# Patient Record
Sex: Male | Born: 1940
Health system: Southern US, Community
[De-identification: ages and names within clinical notes are randomized; demographics above are authoritative.]

## PROBLEM LIST (undated history)

## (undated) DIAGNOSIS — Z95810 Presence of automatic (implantable) cardiac defibrillator: Secondary | ICD-10-CM

## (undated) DIAGNOSIS — I10 Essential (primary) hypertension: Secondary | ICD-10-CM

## (undated) DIAGNOSIS — I499 Cardiac arrhythmia, unspecified: Secondary | ICD-10-CM

## (undated) DIAGNOSIS — I2510 Atherosclerotic heart disease of native coronary artery without angina pectoris: Secondary | ICD-10-CM

## (undated) DIAGNOSIS — C61 Malignant neoplasm of prostate: Secondary | ICD-10-CM

## (undated) DIAGNOSIS — E039 Hypothyroidism, unspecified: Secondary | ICD-10-CM

## (undated) DIAGNOSIS — Z950 Presence of cardiac pacemaker: Secondary | ICD-10-CM

## (undated) DIAGNOSIS — K219 Gastro-esophageal reflux disease without esophagitis: Secondary | ICD-10-CM

## (undated) DIAGNOSIS — E785 Hyperlipidemia, unspecified: Secondary | ICD-10-CM

## (undated) DIAGNOSIS — I517 Cardiomegaly: Secondary | ICD-10-CM

## (undated) DIAGNOSIS — N200 Calculus of kidney: Secondary | ICD-10-CM

## (undated) DIAGNOSIS — I219 Acute myocardial infarction, unspecified: Secondary | ICD-10-CM

## (undated) DIAGNOSIS — H9190 Unspecified hearing loss, unspecified ear: Secondary | ICD-10-CM

## (undated) DIAGNOSIS — I509 Heart failure, unspecified: Secondary | ICD-10-CM

## (undated) HISTORY — PX: PACEMAKER INSERTION: SHX728

## (undated) HISTORY — PX: PROSTATE SURGERY: SHX751

## (undated) HISTORY — PX: HERNIA REPAIR: SHX51

## (undated) HISTORY — PX: PROSTATECTOMY: SHX69

## (undated) HISTORY — PX: CARDIAC DEFIBRILLATOR PLACEMENT: SHX171
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
## 1998-12-15 ENCOUNTER — Emergency Department (HOSPITAL_COMMUNITY): Admission: EM | Admit: 1998-12-15 | Discharge: 1998-12-15 | Payer: Self-pay

## 2004-11-18 ENCOUNTER — Inpatient Hospital Stay (HOSPITAL_COMMUNITY): Admission: RE | Admit: 2004-11-18 | Discharge: 2004-11-20 | Payer: Self-pay | Admitting: Urology

## 2004-11-21 ENCOUNTER — Emergency Department (HOSPITAL_COMMUNITY): Admission: EM | Admit: 2004-11-21 | Discharge: 2004-11-21 | Payer: Self-pay | Admitting: Emergency Medicine

## 2005-04-21 ENCOUNTER — Inpatient Hospital Stay (HOSPITAL_COMMUNITY): Admission: RE | Admit: 2005-04-21 | Discharge: 2005-04-22 | Payer: Self-pay | Admitting: Urology

## 2012-12-02 ENCOUNTER — Observation Stay (HOSPITAL_COMMUNITY)
Admission: EM | Admit: 2012-12-02 | Discharge: 2012-12-04 | Disposition: A | Discharge status: Home / Self Care | Payer: Medicare Other | Attending: Internal Medicine | Admitting: Internal Medicine

## 2012-12-02 ENCOUNTER — Emergency Department (HOSPITAL_COMMUNITY): Payer: Medicare Other

## 2012-12-02 ENCOUNTER — Encounter (HOSPITAL_COMMUNITY): Payer: Self-pay | Admitting: Emergency Medicine

## 2012-12-02 DIAGNOSIS — I2510 Atherosclerotic heart disease of native coronary artery without angina pectoris: Secondary | ICD-10-CM

## 2012-12-02 DIAGNOSIS — Z95810 Presence of automatic (implantable) cardiac defibrillator: Secondary | ICD-10-CM

## 2012-12-02 DIAGNOSIS — R109 Unspecified abdominal pain: Secondary | ICD-10-CM | POA: Diagnosis present

## 2012-12-02 DIAGNOSIS — C61 Malignant neoplasm of prostate: Secondary | ICD-10-CM | POA: Diagnosis present

## 2012-12-02 DIAGNOSIS — E039 Hypothyroidism, unspecified: Secondary | ICD-10-CM

## 2012-12-02 DIAGNOSIS — R739 Hyperglycemia, unspecified: Secondary | ICD-10-CM

## 2012-12-02 DIAGNOSIS — N12 Tubulo-interstitial nephritis, not specified as acute or chronic: Principal | ICD-10-CM

## 2012-12-02 DIAGNOSIS — E876 Hypokalemia: Secondary | ICD-10-CM

## 2012-12-02 DIAGNOSIS — I4891 Unspecified atrial fibrillation: Secondary | ICD-10-CM

## 2012-12-02 DIAGNOSIS — R10A1 Flank pain, right side: Secondary | ICD-10-CM

## 2012-12-02 DIAGNOSIS — R7309 Other abnormal glucose: Secondary | ICD-10-CM | POA: Insufficient documentation

## 2012-12-02 DIAGNOSIS — Z7901 Long term (current) use of anticoagulants: Secondary | ICD-10-CM | POA: Insufficient documentation

## 2012-12-02 DIAGNOSIS — Z79899 Other long term (current) drug therapy: Secondary | ICD-10-CM | POA: Insufficient documentation

## 2012-12-02 HISTORY — DX: Atherosclerotic heart disease of native coronary artery without angina pectoris: I25.10

## 2012-12-02 HISTORY — DX: Calculus of kidney: N20.0

## 2012-12-02 HISTORY — DX: Essential (primary) hypertension: I10

## 2012-12-02 HISTORY — DX: Malignant neoplasm of prostate: C61

## 2012-12-02 HISTORY — DX: Hyperlipidemia, unspecified: E78.5

## 2012-12-02 HISTORY — DX: Acute myocardial infarction, unspecified: I21.9

## 2012-12-02 LAB — COMPREHENSIVE METABOLIC PANEL
ALT: 21 U/L (ref 0–53)
Albumin: 3.2 g/dL — ABNORMAL LOW (ref 3.5–5.2)
Alkaline Phosphatase: 83 U/L (ref 39–117)
BUN: 12 mg/dL (ref 6–23)
CO2: 31 mEq/L (ref 19–32)
Calcium: 9.4 mg/dL (ref 8.4–10.5)
Chloride: 107 mEq/L (ref 96–112)
Creatinine, Ser: 1.03 mg/dL (ref 0.50–1.35)
GFR calc Af Amer: 82 mL/min — ABNORMAL LOW (ref >90)
Potassium: 3.4 mEq/L — ABNORMAL LOW (ref 3.5–5.1)
Total Bilirubin: 1.2 mg/dL (ref 0.3–1.2)
Total Protein: 6.5 g/dL (ref 6.0–8.3)

## 2012-12-02 LAB — CBC WITH DIFFERENTIAL/PLATELET
Basophils Absolute: 0 10*3/uL (ref 0.0–0.1)
Basophils Relative: 1 % (ref 0–1)
Eosinophils Absolute: 0.1 10*3/uL (ref 0.0–0.7)
HCT: 35.7 % — ABNORMAL LOW (ref 39.0–52.0)
Hemoglobin: 11.6 g/dL — ABNORMAL LOW (ref 13.0–17.0)
Lymphocytes Relative: 27 % (ref 12–46)
MCH: 28.8 pg (ref 26.0–34.0)
MCHC: 32.5 g/dL (ref 30.0–36.0)
MCV: 88.6 fL (ref 78.0–100.0)
Monocytes Absolute: 0.8 10*3/uL (ref 0.1–1.0)
Monocytes Relative: 11 % (ref 3–12)
Neutro Abs: 4.5 10*3/uL (ref 1.7–7.7)
Neutrophils Relative %: 60 % (ref 43–77)
RBC: 4.03 MIL/uL — ABNORMAL LOW (ref 4.22–5.81)
RDW: 17.4 % — ABNORMAL HIGH (ref 11.5–15.5)

## 2012-12-02 LAB — URINALYSIS, ROUTINE W REFLEX MICROSCOPIC
Bilirubin Urine: NEGATIVE
Hgb urine dipstick: NEGATIVE
Ketones, ur: NEGATIVE mg/dL
Leukocytes, UA: NEGATIVE
Nitrite: NEGATIVE
Specific Gravity, Urine: 1.024 (ref 1.005–1.030)
Urobilinogen, UA: 0.2 mg/dL (ref 0.0–1.0)
pH: 5 (ref 5.0–8.0)

## 2012-12-02 LAB — LIPASE, BLOOD: Lipase: 21 U/L (ref 11–59)

## 2012-12-02 MED ORDER — MORPHINE SULFATE 4 MG/ML IJ SOLN
4.0000 mg | Freq: Once | INTRAMUSCULAR | Status: AC
  Administered 2012-12-02: 4 mg via INTRAVENOUS
  Filled 2012-12-02: qty 1

## 2012-12-02 MED ORDER — ONDANSETRON HCL 4 MG/2ML IJ SOLN: 4.0000 mg | Freq: Four times a day (QID) | INTRAMUSCULAR | Status: DC | PRN

## 2012-12-02 MED ORDER — OXYCODONE-ACETAMINOPHEN 5-325 MG PO TABS
1.0000 | ORAL_TABLET | Freq: Four times a day (QID) | ORAL | Status: DC | PRN
  Administered 2012-12-02 – 2012-12-03 (×3): 2 via ORAL
  Filled 2012-12-02 (×3): qty 2

## 2012-12-02 MED ORDER — SODIUM CHLORIDE 0.9 % IV SOLN
INTRAVENOUS | Status: DC
  Administered 2012-12-02 – 2012-12-04 (×4): via INTRAVENOUS

## 2012-12-02 MED ORDER — DEXTROSE 5 % IV SOLN
1.0000 g | INTRAVENOUS | Status: DC
  Administered 2012-12-02 – 2012-12-03 (×2): 1 g via INTRAVENOUS
  Filled 2012-12-02 (×3): qty 10

## 2012-12-02 MED ORDER — SODIUM CHLORIDE 0.9 % IV BOLUS (SEPSIS)
1000.0000 mL | Freq: Once | INTRAVENOUS | Status: AC
  Administered 2012-12-02: 1000 mL via INTRAVENOUS

## 2012-12-02 MED ORDER — ONDANSETRON HCL 4 MG/2ML IJ SOLN
4.0000 mg | Freq: Once | INTRAMUSCULAR | Status: AC
  Administered 2012-12-02: 4 mg via INTRAVENOUS
  Filled 2012-12-02: qty 2

## 2012-12-02 MED ORDER — LEVOTHYROXINE SODIUM 88 MCG PO TABS
88.0000 ug | ORAL_TABLET | Freq: Every day | ORAL | Status: DC
  Administered 2012-12-03 – 2012-12-04 (×2): 88 ug via ORAL
  Filled 2012-12-02 (×3): qty 1

## 2012-12-02 MED ORDER — ATORVASTATIN CALCIUM 10 MG PO TABS
10.0000 mg | ORAL_TABLET | Freq: Every day | ORAL | Status: DC
  Administered 2012-12-03 – 2012-12-04 (×2): 10 mg via ORAL
  Filled 2012-12-02 (×2): qty 1

## 2012-12-02 MED ORDER — MORPHINE SULFATE 4 MG/ML IJ SOLN
4.0000 mg | INTRAMUSCULAR | Status: DC | PRN
  Administered 2012-12-02: 4 mg via INTRAVENOUS
  Filled 2012-12-02: qty 1

## 2012-12-02 MED ORDER — ACETAMINOPHEN 325 MG PO TABS: 650.0000 mg | ORAL_TABLET | Freq: Four times a day (QID) | ORAL | Status: DC | PRN

## 2012-12-02 MED ORDER — ASPIRIN EC 81 MG PO TBEC
81.0000 mg | DELAYED_RELEASE_TABLET | Freq: Every day | ORAL | Status: DC
  Administered 2012-12-02 – 2012-12-04 (×3): 81 mg via ORAL
  Filled 2012-12-02 (×3): qty 1

## 2012-12-02 MED ORDER — WARFARIN SODIUM 2.5 MG PO TABS
2.5000 mg | ORAL_TABLET | Freq: Once | ORAL | Status: AC
  Administered 2012-12-02: 2.5 mg via ORAL
  Filled 2012-12-02: qty 1

## 2012-12-02 MED ORDER — PANTOPRAZOLE SODIUM 40 MG PO TBEC
40.0000 mg | DELAYED_RELEASE_TABLET | Freq: Every day | ORAL | Status: DC
  Administered 2012-12-03 – 2012-12-04 (×2): 40 mg via ORAL
  Filled 2012-12-02 (×2): qty 1

## 2012-12-02 MED ORDER — CARVEDILOL 25 MG PO TABS
25.0000 mg | ORAL_TABLET | Freq: Two times a day (BID) | ORAL | Status: DC
  Administered 2012-12-02 – 2012-12-04 (×4): 25 mg via ORAL
  Filled 2012-12-02 (×6): qty 1

## 2012-12-02 MED ORDER — ZOLPIDEM TARTRATE 5 MG PO TABS
5.0000 mg | ORAL_TABLET | Freq: Every day | ORAL | Status: DC
  Administered 2012-12-02 – 2012-12-03 (×2): 5 mg via ORAL
  Filled 2012-12-02 (×2): qty 1

## 2012-12-02 MED ORDER — WARFARIN - PHARMACIST DOSING INPATIENT: Freq: Every day | Status: DC

## 2012-12-02 NOTE — ED Notes (Addendum)
Patient with lower right sided abdominal pain since Friday.  Rates pain as a 9 and pain is constant.  Patient reports normal BM this AM.  Denies nausea, vomiting.  Pain describes pain as aching.  Patient reports pain is similar to kidney stone pain.  Reports some burning with urination.

## 2012-12-02 NOTE — Progress Notes (Signed)
ANTICOAGULATION CONSULT NOTE - Initial Consult  Pharmacy Consult for Coumadin Indication: atrial fibrillation  Allergies  Allergen Reactions  . Ivp Dye (Iodinated Diagnostic Agents) Anaphylaxis    Patient Measurements: Height: 5\' 10"  (177.8 cm) Weight: 154 lb (69.854 kg) IBW/kg (Calculated) : 73  Vital Signs: Temp: 98 F (36.7 C) (08/04 1606) Temp src: Oral (08/04 1606) BP: 145/99 mmHg (08/04 1606) Pulse Rate: 80 (08/04 1606)  Labs:  Recent Labs  12/02/12 1150 12/02/12 1713  HGB 11.6*  --   HCT 35.7*  --   PLT 255  --   LABPROT  --  23.0*  INR  --  2.11*  CREATININE 1.03  --     Estimated Creatinine Clearance: 64.1 ml/min (by C-G formula based on Cr of 1.03).   Medical History: Past Medical History  Diagnosis Date  . Kidney stones   . Prostate cancer   . MI (myocardial infarction)   . Coronary artery disease   . Hyperlipidemia   . Hypertension     Medications:  Prescriptions prior to admission  Medication Sig Dispense Refill  . aspirin EC 81 MG tablet Take 81 mg by mouth daily.      Marland Kitchen atorvastatin (LIPITOR) 10 MG tablet Take 10 mg by mouth daily.      . carvedilol (COREG) 25 MG tablet Take 25 mg by mouth 2 (two) times daily with a meal.      . furosemide (LASIX) 40 MG tablet Take 40 mg by mouth daily.      Marland Kitchen levothyroxine (SYNTHROID, LEVOTHROID) 88 MCG tablet Take 88 mcg by mouth daily before breakfast.      . omeprazole (PRILOSEC) 40 MG capsule Take 40 mg by mouth daily.      Marland Kitchen oxyCODONE-acetaminophen (PERCOCET/ROXICET) 5-325 MG per tablet Take 1-2 tablets by mouth every 6 (six) hours as needed for pain.      . potassium chloride SA (K-DUR,KLOR-CON) 20 MEQ tablet Take 10 mEq by mouth daily.      . Simethicone (GAS-X PO) Take 1 capsule by mouth 2 (two) times daily as needed (gas).      . warfarin (COUMADIN) 2.5 MG tablet Take 1.25-2.5 mg by mouth daily. 2.5 mg for 2 days then 1.25 mg for 1 day. Pt took 2.5mg  last night      . zolpidem (AMBIEN) 5 MG  tablet Take 5 mg by mouth at bedtime.       Scheduled:  . aspirin EC  81 mg Oral Daily  . [START ON 12/03/2012] atorvastatin  10 mg Oral Daily  . carvedilol  25 mg Oral BID WC  . cefTRIAXone (ROCEPHIN)  IV  1 g Intravenous Q24H  . [START ON 12/03/2012] levothyroxine  88 mcg Oral QAC breakfast  . [START ON 12/03/2012] pantoprazole  40 mg Oral Daily  . zolpidem  5 mg Oral QHS    Assessment:  72yo M admitted with R flank/abd pain. Pharmacy is asked to dose Coumadin for A.fib. Home dose is 2.5mg  x 2 days then 1.25mg  x 1 day. Last taken 2.5mg  on 12/01/12. INR is in therapeutic range.  Goal of Therapy:  INR 2-3 Monitor platelets by anticoagulation protocol: Yes   Plan:   Give Coumadin 2.5 mg tonight.  Check PT/INR daily.  Charolotte Eke, PharmD, pager [phone]. 12/02/2012,6:23 PM.

## 2012-12-02 NOTE — ED Provider Notes (Signed)
CSN: [id number]     Arrival date & time 12/02/12  1108 History     First MD Initiated Contact with Patient 12/02/12 1117     Chief Complaint  Patient presents with  . Abdominal Pain   (Consider location/radiation/quality/duration/timing/severity/associated sxs/prior Treatment) HPI Comments: Patient presents emergency department with chief complaint of right-sided abdominal pain, that began on Friday. States that it is progressively worsened. He states that it feels similar to previous kidney stone. He denies fevers, chills, nausea, and vomiting. States his pain is 9/10, and is constant. He states that he had a kidney stone approximately 15 years ago, that had to be removed.   The history is provided by the patient. No language interpreter was used.    Past Medical History  Diagnosis Date  . Kidney stones   . Prostate cancer   . MI (myocardial infarction)   . Coronary artery disease   . Hyperlipidemia   . Hypertension    Past Surgical History  Procedure Laterality Date  . Prostatectomy    . Pacemaker insertion    . Cardiac defibrillator placement    . Hernia repair     No family history on file. History  Substance Use Topics  . Smoking status: Never Smoker   . Smokeless tobacco: Not on file  . Alcohol Use: No    Review of Systems  All other systems reviewed and are negative.    Allergies  Review of patient's allergies indicates not on file.  Home Medications  No current outpatient prescriptions on file. BP 156/89  Pulse 74  Temp(Src) 97.7 F (36.5 C) (Oral)  Resp 16  SpO2 97% Physical Exam  Nursing note and vitals reviewed. Constitutional: He is oriented to person, place, and time. He appears well-developed and well-nourished.  HENT:  Head: Normocephalic and atraumatic.  Eyes: Conjunctivae and EOM are normal. Pupils are equal, round, and reactive to light. Right eye exhibits no discharge. Left eye exhibits no discharge. No scleral icterus.  Neck: Normal  range of motion. Neck supple. No JVD present.  Cardiovascular: Normal rate, regular rhythm, normal heart sounds and intact distal pulses.  Exam reveals no gallop and no friction rub.   No murmur heard. Pulmonary/Chest: Effort normal and breath sounds normal. No respiratory distress. He has no wheezes. He has no rales. He exhibits no tenderness.  Abdominal: Soft. Bowel sounds are normal. He exhibits no distension and no mass. There is tenderness. There is guarding. There is no rebound.  Right lower quadrant tenderness palpation, with guarding  Musculoskeletal: Normal range of motion. He exhibits no edema and no tenderness.  Neurological: He is alert and oriented to person, place, and time.  CN 3-12 intact  Skin: Skin is warm and dry.  Psychiatric: He has a normal mood and affect. His behavior is normal. Judgment and thought content normal.    ED Course   Procedures (including critical care time)  Labs Reviewed  CBC WITH DIFFERENTIAL  COMPREHENSIVE METABOLIC PANEL  LIPASE, BLOOD  URINALYSIS, ROUTINE W REFLEX MICROSCOPIC   Results for orders placed during the hospital encounter of 12/02/12  CBC WITH DIFFERENTIAL      Result Value Range   WBC 7.6  4.0 - 10.5 K/uL   RBC 4.03 (*) 4.22 - 5.81 MIL/uL   Hemoglobin 11.6 (*) 13.0 - 17.0 g/dL   HCT 78.2 (*) 95.6 - 21.3 %   MCV 88.6  78.0 - 100.0 fL   MCH 28.8  26.0 - 34.0 pg   MCHC  32.5  30.0 - 36.0 g/dL   RDW 40.9 (*) 81.1 - 91.4 %   Platelets 255  150 - 400 K/uL   Neutrophils Relative % 60  43 - 77 %   Neutro Abs 4.5  1.7 - 7.7 K/uL   Lymphocytes Relative 27  12 - 46 %   Lymphs Abs 2.1  0.7 - 4.0 K/uL   Monocytes Relative 11  3 - 12 %   Monocytes Absolute 0.8  0.1 - 1.0 K/uL   Eosinophils Relative 2  0 - 5 %   Eosinophils Absolute 0.1  0.0 - 0.7 K/uL   Basophils Relative 1  0 - 1 %   Basophils Absolute 0.0  0.0 - 0.1 K/uL  COMPREHENSIVE METABOLIC PANEL      Result Value Range   Sodium 144  135 - 145 mEq/L   Potassium 3.4 (*) 3.5  - 5.1 mEq/L   Chloride 107  96 - 112 mEq/L   CO2 31  19 - 32 mEq/L   Glucose, Bld 182 (*) 70 - 99 mg/dL   BUN 12  6 - 23 mg/dL   Creatinine, Ser 7.82  0.50 - 1.35 mg/dL   Calcium 9.4  8.4 - 95.6 mg/dL   Total Protein 6.5  6.0 - 8.3 g/dL   Albumin 3.2 (*) 3.5 - 5.2 g/dL   AST 18  0 - 37 U/L   ALT 21  0 - 53 U/L   Alkaline Phosphatase 83  39 - 117 U/L   Total Bilirubin 1.2  0.3 - 1.2 mg/dL   GFR calc non Af Amer 71 (*) >90 mL/min   GFR calc Af Amer 82 (*) >90 mL/min  LIPASE, BLOOD      Result Value Range   Lipase 21  11 - 59 U/L  URINALYSIS, ROUTINE W REFLEX MICROSCOPIC      Result Value Range   Color, Urine YELLOW  YELLOW   APPearance CLEAR  CLEAR   Specific Gravity, Urine 1.024  1.005 - 1.030   pH 5.0  5.0 - 8.0   Glucose, UA NEGATIVE  NEGATIVE mg/dL   Hgb urine dipstick NEGATIVE  NEGATIVE   Bilirubin Urine NEGATIVE  NEGATIVE   Ketones, ur NEGATIVE  NEGATIVE mg/dL   Protein, ur NEGATIVE  NEGATIVE mg/dL   Urobilinogen, UA 0.2  0.0 - 1.0 mg/dL   Nitrite NEGATIVE  NEGATIVE   Leukocytes, UA NEGATIVE  NEGATIVE   Ct Abdomen Pelvis Wo Contrast  12/02/2012   *RADIOLOGY REPORT*  Clinical Data: Flank pain  CT ABDOMEN AND PELVIS WITHOUT CONTRAST  Technique:  Multidetector CT imaging of the abdomen and pelvis was performed following the standard protocol without oral or intravenous contrast.  Comparison: November 28, 2012  Findings:  There is scarring in the lung bases with posterior basilar pleural thickening bilaterally.  There are pacemaker leads attached to the heart.  Heart is enlarged. There are multiple foci of coronary artery calcification.  No focal liver lesions are identified on this nonintravenous contrast enhanced study. There is no biliary duct dilatation. There is sludge in the gallbladder.  Spleen, pancreas, adrenals appear normal.  There is a 3 mm calculus in the posterior mid left kidney, stable. There are no other intrarenal calculi on either side.  There is no hydronephrosis or  renal mass on either side.  Mild scarring in Gerota's fascia bilaterally is stable.  In the pelvis, there is no mass or fluid collection.  A calcification in the inferior aspect of  the bladder measuring 8 x 8 mm remains without change.  There are smaller calcifications at the level of the prostatic urethra.  There are multiple sigmoid diverticula without diverticulitis.  Appendix appears normal.  There is no bowel obstruction.  No free air or portal venous air.  There is no ascites, adenopathy, or abscess in the abdomen or pelvis.  There is atherosclerotic change in the aorta iliac arteries but no aneurysm.  There are no appreciable blastic or lytic bone lesions.  IMPRESSION: Calcifications in the inferior bladder and prostatic urethra. These calcifications may represent dystrophic calcifications; calculus is possible, however.  Potentially, direct visualization may be advisable to further evaluate these areas.  There are multiple sigmoid diverticula without diverticulitis.  There is a nonobstructing 3 mm calculus in the left kidney.  There is no hydronephrosis on either side.  No ureteral calculi identified.  There is no abscess or bowel obstruction.  There are multiple foci of coronary artery calcification.  Appendix appears normal.  There is stable scarring in Gerota's fascia bilaterally, a finding of questionable clinical significance.   Original Report Authenticated By: Bretta Bang, M.D.     1. Right flank pain     MDM  Patient with right flank pain.  Past history of kidney stones. Order CT scan, and reevaluate.  Patient seen by and discussed with Dr. Jodi Mourning, who tells me that we should probably admit the patient to medicine for observation. CT is negative, the patient's pain is still moderate to severe. Negative CT findings do not coincide with patient's physical exam and right flank pain. Patient has a contrast allergy.  Discussed patient with Dr. Jomarie Longs, who will admit the patient to  medicine.  Roxy Horseman, PA-C 12/02/12 1538

## 2012-12-02 NOTE — H&P (Signed)
[name] History and Physical  [name]:[id number] DOB: 04-13-1941 DOA: 12/02/2012  Referring physician:EDP PCP: [name], MD  Specialists: Dr.[facility]  Chief Complaint: R flank/abd pain  HPI: Mitchell Murray is a 72 y.o. male with PMH of prostate cancer, renal calculi, CAD, AICD, Afib on Coumadin presents to the ER with the above complaint. Patient reports onset of right flank/abdominal pain on Friday, it was 7/10 then, nonradiating, it has progressively worsened. He denies any fevers or chills, but reports dysuria and urinary frequency for past few days. He went to the ER in [place], had a CT scan done which was felt to be okay and was discharged home. Since he continued to have severe pain he came to the ER today. No history of nausea or vomiting, last bowel movement was earlier today   Review of Systems: The patient denies anorexia, fever, weight loss,, vision loss, decreased hearing, hoarseness, chest pain, syncope, dyspnea on exertion, peripheral edema, balance deficits, hemoptysis, abdominal pain, melena, hematochezia, severe indigestion/heartburn, hematuria, incontinence, genital sores, muscle weakness, suspicious skin lesions, transient blindness, difficulty walking, depression, unusual weight change, abnormal bleeding, enlarged lymph nodes, angioedema, and breast masses.    Past Medical History  Diagnosis Date  . Kidney stones   . Prostate cancer   . MI (myocardial infarction)   . Coronary artery disease   . Hyperlipidemia   . Hypertension    Past Surgical History  Procedure Laterality Date  . Prostatectomy    . Pacemaker insertion    . Cardiac defibrillator placement    . Hernia repair     Social History:  reports that he has never smoked. He has never used smokeless tobacco. He reports that he does not drink alcohol or use illicit drugs. Lives at home with his wife independent in ADLs  Allergies  Allergen Reactions  . Ivp Dye  (Iodinated Diagnostic Agents) Anaphylaxis    family history. Significant for heart disease in father  Prior to Admission medications   Medication Sig Start Date End Date Taking? Authorizing Provider  aspirin EC 81 MG tablet Take 81 mg by mouth daily.   Yes Historical Provider, MD  atorvastatin (LIPITOR) 10 MG tablet Take 10 mg by mouth daily.   Yes Historical Provider, MD  carvedilol (COREG) 25 MG tablet Take 25 mg by mouth 2 (two) times daily with a meal.   Yes Historical Provider, MD  furosemide (LASIX) 40 MG tablet Take 40 mg by mouth daily.   Yes Historical Provider, MD  levothyroxine (SYNTHROID, LEVOTHROID) 88 MCG tablet Take 88 mcg by mouth daily before breakfast.   Yes Historical Provider, MD  omeprazole (PRILOSEC) 40 MG capsule Take 40 mg by mouth daily.   Yes Historical Provider, MD  oxyCODONE-acetaminophen (PERCOCET/ROXICET) 5-325 MG per tablet Take 1-2 tablets by mouth every 6 (six) hours as needed for pain.   Yes Historical Provider, MD  potassium chloride SA (K-DUR,KLOR-CON) 20 MEQ tablet Take 10 mEq by mouth daily.   Yes Historical Provider, MD  Simethicone (GAS-X PO) Take 1 capsule by mouth 2 (two) times daily as needed (gas).   Yes Historical Provider, MD  warfarin (COUMADIN) 2.5 MG tablet Take 1.25-2.5 mg by mouth daily. 2.5 mg for 2 days then 1.25 mg for 1 day. Pt took 2.5mg  last night   Yes Historical Provider, MD  zolpidem (AMBIEN) 5 MG tablet Take 5 mg by mouth at bedtime.   Yes Historical Provider, MD   Physical Exam: Filed Vitals:   12/02/12 1414  BP: 131/88  Pulse: 75  Temp:   Resp: 20     General: Alert awake oriented x3 in mild  acute distress due to pain  HEENT PERRLA, EOMI  CVS S1-S2 regular rate rhythm no murmurs rubs or gallops   Lungs are clear to auscultation bilaterally  Abdomen soft, severe right flank tenderness, bowel sounds present  Extremities no edema clubbing or cyanosis  Skin no rashes or skin breakdown  Neuro moves all extremities  no localizing signs  Psychiatric appropriate mood and affect   Labs on Admission:  Basic Metabolic Panel:  Recent Labs Lab 12/02/12 1150  NA 144  K 3.4*  CL 107  CO2 31  GLUCOSE 182*  BUN 12  CREATININE 1.03  CALCIUM 9.4   Liver Function Tests:  Recent Labs Lab 12/02/12 1150  AST 18  ALT 21  ALKPHOS 83  BILITOT 1.2  PROT 6.5  ALBUMIN 3.2*    Recent Labs Lab 12/02/12 1150  LIPASE 21   No results found for this basename: AMMONIA,  in the last 168 hours CBC:  Recent Labs Lab 12/02/12 1150  WBC 7.6  NEUTROABS 4.5  HGB 11.6*  HCT 35.7*  MCV 88.6  PLT 255   Cardiac Enzymes: No results found for this basename: CKTOTAL, CKMB, CKMBINDEX, TROPONINI,  in the last 168 hours  BNP (last 3 results) No results found for this basename: PROBNP,  in the last 8760 hours CBG: No results found for this basename: GLUCAP,  in the last 168 hours  Radiological Exams on Admission: Ct Abdomen Pelvis Wo Contrast  12/02/2012   *RADIOLOGY REPORT*  Clinical Data: Flank pain  CT ABDOMEN AND PELVIS WITHOUT CONTRAST  Technique:  Multidetector CT imaging of the abdomen and pelvis was performed following the standard protocol without oral or intravenous contrast.  Comparison: November 28, 2012  Findings:  There is scarring in the lung bases with posterior basilar pleural thickening bilaterally.  There are pacemaker leads attached to the heart.  Heart is enlarged. There are multiple foci of coronary artery calcification.  No focal liver lesions are identified on this nonintravenous contrast enhanced study. There is no biliary duct dilatation. There is sludge in the gallbladder.  Spleen, pancreas, adrenals appear normal.  There is a 3 mm calculus in the posterior mid left kidney, stable. There are no other intrarenal calculi on either side.  There is no hydronephrosis or renal mass on either side.  Mild scarring in Gerota's fascia bilaterally is stable.  In the pelvis, there is no mass or fluid  collection.  A calcification in the inferior aspect of the bladder measuring 8 x 8 mm remains without change.  There are smaller calcifications at the level of the prostatic urethra.  There are multiple sigmoid diverticula without diverticulitis.  Appendix appears normal.  There is no bowel obstruction.  No free air or portal venous air.  There is no ascites, adenopathy, or abscess in the abdomen or pelvis.  There is atherosclerotic change in the aorta iliac arteries but no aneurysm.  There are no appreciable blastic or lytic bone lesions.  IMPRESSION: Calcifications in the inferior bladder and prostatic urethra. These calcifications may represent dystrophic calcifications; calculus is possible, however.  Potentially, direct visualization may be advisable to further evaluate these areas.  There are multiple sigmoid diverticula without diverticulitis.  There is a nonobstructing 3 mm calculus in the left kidney.  There is no hydronephrosis on either side.  No ureteral calculi identified.  There is no  abscess or bowel obstruction.  There are multiple foci of coronary artery calcification.  Appendix appears normal.  There is stable scarring in Gerota's fascia bilaterally, a finding of questionable clinical significance.   Original Report Authenticated By: Bretta Bang, M.D.    Assessment/Plan 1. Right pyelonephritis  - Reviewed scans with the radiologist, he has swelling of Gerotas fascia around the right kidney, this was also noted on CT scan today and 7/31 at [facility], which can be seen in the setting of pyelonephritis. Unfortunately CT scan was noncontrast due to history of allergy - Given clinical symptoms and severe right CVA tenderness today, will treat was pyelonephritis  -Will start IV ceftriaxone, UA not very impressive but will check cultures -IV fluids, supportive care  2. history of CAD, AICD -Continue aspirin or blocker and statin  3. history of Afib -In NSR now, continue metoprolol and  Coumadin  4. history of prostate cancer -Status post prostatectomy in 2008, followed by Dr. Annabell Howells  5. Hypothyroidism -Continue Synthroid  DVT prophylaxis: Already anticoagulated with Coumadin  Code Status: DNR Family Communication: d/w pt and wife at bedside Disposition Plan: inpatient  Time spent:  [name] Pager [phone]  If 7PM-7AM, please contact night-coverage [email or web address] Password [facility] 12/02/2012, 3:41 PM

## 2012-12-02 NOTE — Progress Notes (Signed)
Pt confirms pcp is [facility] updated

## 2012-12-03 DIAGNOSIS — E039 Hypothyroidism, unspecified: Secondary | ICD-10-CM | POA: Diagnosis present

## 2012-12-03 DIAGNOSIS — E876 Hypokalemia: Secondary | ICD-10-CM | POA: Diagnosis present

## 2012-12-03 DIAGNOSIS — R739 Hyperglycemia, unspecified: Secondary | ICD-10-CM | POA: Diagnosis present

## 2012-12-03 LAB — CBC
HCT: 32.9 % — ABNORMAL LOW (ref 39.0–52.0)
Hemoglobin: 10.5 g/dL — ABNORMAL LOW (ref 13.0–17.0)
MCH: 28.9 pg (ref 26.0–34.0)
MCV: 90.6 fL (ref 78.0–100.0)
Platelets: 267 10*3/uL (ref 150–400)
RBC: 3.63 MIL/uL — ABNORMAL LOW (ref 4.22–5.81)
WBC: 6.9 10*3/uL (ref 4.0–10.5)

## 2012-12-03 LAB — BASIC METABOLIC PANEL
BUN: 10 mg/dL (ref 6–23)
CO2: 30 mEq/L (ref 19–32)
Calcium: 8.6 mg/dL (ref 8.4–10.5)
Chloride: 107 mEq/L (ref 96–112)
Creatinine, Ser: 1.01 mg/dL (ref 0.50–1.35)
Glucose, Bld: 137 mg/dL — ABNORMAL HIGH (ref 70–99)

## 2012-12-03 LAB — HEMOGLOBIN A1C: Mean Plasma Glucose: 128 mg/dL — ABNORMAL HIGH (ref <117)

## 2012-12-03 LAB — PROTIME-INR: Prothrombin Time: 25.2 seconds — ABNORMAL HIGH (ref 11.6–15.2)

## 2012-12-03 MED ORDER — WARFARIN 1.25 MG HALF TABLET
1.2500 mg | ORAL_TABLET | Freq: Once | ORAL | Status: AC
  Administered 2012-12-03: 1.25 mg via ORAL
  Filled 2012-12-03: qty 1

## 2012-12-03 MED ORDER — POTASSIUM CHLORIDE CRYS ER 20 MEQ PO TBCR
20.0000 meq | EXTENDED_RELEASE_TABLET | Freq: Three times a day (TID) | ORAL | Status: AC
  Administered 2012-12-03 (×3): 20 meq via ORAL
  Filled 2012-12-03 (×3): qty 1

## 2012-12-03 NOTE — Progress Notes (Signed)
[name] PROGRESS NOTE  DOMINICO ROD UJW:[id number] DOB: 1940/05/16 DOA: 12/02/2012 PCP: [name], MD  Brief narrative: Mitchell Murray is an 72 y.o. male with a PMH of prostate cancer, renal calculi, CAD status post AICD, and atrial fibrillation on chronic Coumadin who was admitted with suspected right-sided pyelonephritis on 12/02/2012. Patient had rapid resolution of his flank pain overnight and at this point, the differential diagnosis is pyelonephritis versus nephrolithiasis.  Assessment/Plan: Principal Problem:   Pyelonephritis -CT scans reviewed by admitting physician with radiologist: swelling of Gerotas fascia around the right kidney noted. This can be seen with pyelonephritis but is nonspecific. -Questionable nephrolithiasis. Continue hydration. May have passed a stone given rapid resolution of flank pain. -Placed on empiric Rocephin. Urine bland. Awaiting culture. If negative, would discontinue antibiotics and discharged home. If positive, would treat for 10-14 days with appropriate therapy. -Mobilize with PT/nursing staff to ensure pain has not returned. Active Problems:   Hyperglycemia -Glucose 182 on admission. Not a fasting sample. Glucose 137 nonfasting sample.  We'll check hemoglobin A1c.   Hypokalemia -Replace potassium.   Right flank pain -Thought to be secondary to pyelonephritis versus nephrolithiasis. Rapid resolution of pain suggests that he may have passed a kidney stone.   CAD (coronary artery disease) /  AICD (automatic cardioverter/defibrillator) present -Continue aspirin, beta blocker, and statin.   A-fib -Continue Coumadin per pharmacy. -Heart sounds are regular on exam and heart rate 60s-80s.   Prostate cancer -Status post prostatectomy in 2008.   Hypothyroidism -Continue Synthroid.  Code Status: DNR Family Communication: No family at the bedside. Disposition Plan: Home when urine cultures finalized.   Medical  Consultants:  None.  Other Consultants:  None.  Anti-infectives:  Rocephin and 12/02/2012--->  HPI/Subjective: DIERKS WACH says his flank pain has resolved. He denies any nausea or vomiting. Has not noticed any blood in his urine prior to admission.  Objective: Filed Vitals:   12/02/12 1414 12/02/12 1606 12/02/12 2142 12/03/12 0536  BP: 131/88 145/99 108/69 104/76  Pulse: 75 80 81 66  Temp:  98 F (36.7 C) 98.4 F (36.9 C) 98.6 F (37 C)  TempSrc:  Oral Oral Oral  Resp: 20 20 16 16   Height:  5\' 10"  (1.778 m)    Weight:  69.854 kg (154 lb)    SpO2: 93% 97% 96% 96%   No intake or output data in the 24 hours ending 12/03/12 0717  Exam: Gen:  NAD Cardiovascular:  RRR, No M/R/G Respiratory:  Lungs CTAB Gastrointestinal:  Abdomen soft, NT/ND, + BS Extremities:  1+ edema  Data Reviewed: Basic Metabolic Panel:  Recent Labs Lab 12/02/12 1150 12/03/12 0336  NA 144 143  K 3.4* 3.2*  CL 107 107  CO2 31 30  GLUCOSE 182* 137*  BUN 12 10  CREATININE 1.03 1.01  CALCIUM 9.4 8.6   GFR Estimated Creatinine Clearance: 65.4 ml/min (by C-G formula based on Cr of 1.01). Liver Function Tests:  Recent Labs Lab 12/02/12 1150  AST 18  ALT 21  ALKPHOS 83  BILITOT 1.2  PROT 6.5  ALBUMIN 3.2*    Recent Labs Lab 12/02/12 1150  LIPASE 21   Coagulation profile  Recent Labs Lab 12/02/12 1713 12/03/12 0336  INR 2.11* 2.38*    CBC:  Recent Labs Lab 12/02/12 1150 12/03/12 0336  WBC 7.6 6.9  NEUTROABS 4.5  --   HGB 11.6* 10.5*  HCT 35.7* 32.9*  MCV 88.6 90.6  PLT 255 267   Microbiology No results found  for this or any previous visit (from the past 240 hour(s)).   Procedures and Diagnostic Studies: Ct Abdomen Pelvis Wo Contrast  12/02/2012   *RADIOLOGY REPORT*  Clinical Data: Flank pain  CT ABDOMEN AND PELVIS WITHOUT CONTRAST  Technique:  Multidetector CT imaging of the abdomen and pelvis was performed following the standard protocol without oral or  intravenous contrast.  Comparison: November 28, 2012  Findings:  There is scarring in the lung bases with posterior basilar pleural thickening bilaterally.  There are pacemaker leads attached to the heart.  Heart is enlarged. There are multiple foci of coronary artery calcification.  No focal liver lesions are identified on this nonintravenous contrast enhanced study. There is no biliary duct dilatation. There is sludge in the gallbladder.  Spleen, pancreas, adrenals appear normal.  There is a 3 mm calculus in the posterior mid left kidney, stable. There are no other intrarenal calculi on either side.  There is no hydronephrosis or renal mass on either side.  Mild scarring in Gerota's fascia bilaterally is stable.  In the pelvis, there is no mass or fluid collection.  A calcification in the inferior aspect of the bladder measuring 8 x 8 mm remains without change.  There are smaller calcifications at the level of the prostatic urethra.  There are multiple sigmoid diverticula without diverticulitis.  Appendix appears normal.  There is no bowel obstruction.  No free air or portal venous air.  There is no ascites, adenopathy, or abscess in the abdomen or pelvis.  There is atherosclerotic change in the aorta iliac arteries but no aneurysm.  There are no appreciable blastic or lytic bone lesions.  IMPRESSION: Calcifications in the inferior bladder and prostatic urethra. These calcifications may represent dystrophic calcifications; calculus is possible, however.  Potentially, direct visualization may be advisable to further evaluate these areas.  There are multiple sigmoid diverticula without diverticulitis.  There is a nonobstructing 3 mm calculus in the left kidney.  There is no hydronephrosis on either side.  No ureteral calculi identified.  There is no abscess or bowel obstruction.  There are multiple foci of coronary artery calcification.  Appendix appears normal.  There is stable scarring in Gerota's fascia bilaterally,  a finding of questionable clinical significance.   Original Report Authenticated By: Bretta Bang, M.D.    Scheduled Meds: . aspirin EC  81 mg Oral Daily  . atorvastatin  10 mg Oral Daily  . carvedilol  25 mg Oral BID WC  . cefTRIAXone (ROCEPHIN)  IV  1 g Intravenous Q24H  . levothyroxine  88 mcg Oral QAC breakfast  . pantoprazole  40 mg Oral Daily  . Warfarin - Pharmacist Dosing Inpatient   Does not apply q1800  . zolpidem  5 mg Oral QHS   Continuous Infusions: . sodium chloride 100 mL/hr at 12/02/12 1631    Time spent: 35 minutes with greater than 50% of time spent discussing diagnostic test results, clinical impression, and plan of care.   LOS: 1 day   Lua Feng  [name] Pager [phone].   *Please note that the hospitalists switch teams on Wednesdays. Please call the flow manager at [phone] if you are having difficulty reaching the hospitalist taking care of this patient as she can update you and provide the most up-to-date pager number of provider caring for the patient. If 8PM-8AM, please contact night-coverage at [email or web address], password [facility]  12/03/2012, 7:17 AM

## 2012-12-03 NOTE — Progress Notes (Signed)
ANTICOAGULATION CONSULT NOTE - Follow Up  Pharmacy Consult for Coumadin Indication: atrial fibrillation  Allergies  Allergen Reactions  . Ivp Dye (Iodinated Diagnostic Agents) Anaphylaxis    Patient Measurements: Height: 5\' 10"  (177.8 cm) Weight: 154 lb (69.854 kg) IBW/kg (Calculated) : 73  Vital Signs: Temp: 98.6 F (37 C) (08/05 0536) Temp src: Oral (08/05 0536) BP: 104/76 mmHg (08/05 0536) Pulse Rate: 66 (08/05 0536)  Labs:  Recent Labs  12/02/12 1150 12/02/12 1713 12/03/12 0336  HGB 11.6*  --  10.5*  HCT 35.7*  --  32.9*  PLT 255  --  267  LABPROT  --  23.0* 25.2*  INR  --  2.11* 2.38*  CREATININE 1.03  --  1.01    Estimated Creatinine Clearance: 65.4 ml/min (by C-G formula based on Cr of 1.01).   Medical History: Past Medical History  Diagnosis Date  . Kidney stones   . Prostate cancer   . MI (myocardial infarction)   . Coronary artery disease   . Hyperlipidemia   . Hypertension     Medications:  Prescriptions prior to admission  Medication Sig Dispense Refill  . aspirin EC 81 MG tablet Take 81 mg by mouth daily.      Marland Kitchen atorvastatin (LIPITOR) 10 MG tablet Take 10 mg by mouth daily.      . carvedilol (COREG) 25 MG tablet Take 25 mg by mouth 2 (two) times daily with a meal.      . furosemide (LASIX) 40 MG tablet Take 40 mg by mouth daily.      Marland Kitchen levothyroxine (SYNTHROID, LEVOTHROID) 88 MCG tablet Take 88 mcg by mouth daily before breakfast.      . omeprazole (PRILOSEC) 40 MG capsule Take 40 mg by mouth daily.      Marland Kitchen oxyCODONE-acetaminophen (PERCOCET/ROXICET) 5-325 MG per tablet Take 1-2 tablets by mouth every 6 (six) hours as needed for pain.      . potassium chloride SA (K-DUR,KLOR-CON) 20 MEQ tablet Take 10 mEq by mouth daily.      . Simethicone (GAS-X PO) Take 1 capsule by mouth 2 (two) times daily as needed (gas).      . warfarin (COUMADIN) 2.5 MG tablet Take 1.25-2.5 mg by mouth daily. 2.5 mg for 2 days then 1.25 mg for 1 day. Pt took 2.5mg  last  night      . zolpidem (AMBIEN) 5 MG tablet Take 5 mg by mouth at bedtime.       Scheduled:  . aspirin EC  81 mg Oral Daily  . atorvastatin  10 mg Oral Daily  . carvedilol  25 mg Oral BID WC  . cefTRIAXone (ROCEPHIN)  IV  1 g Intravenous Q24H  . levothyroxine  88 mcg Oral QAC breakfast  . pantoprazole  40 mg Oral Daily  . potassium chloride  20 mEq Oral TID  . Warfarin - Pharmacist Dosing Inpatient   Does not apply q1800  . zolpidem  5 mg Oral QHS    Assessment: 72yo M admitted with R flank/abd pain. Pharmacy is asked to dose Coumadin for A.fib. Home dose is 2.5mg  x 2 days then 1.25mg  x 1 day then repeat. Last taken 2.5mg  on 12/01/12. INR therapeutic on admission 8/4  INR therapeutic this AM - 2.5mg  given last evening  CBC stable and no reported bleeding   Goal of Therapy:  INR 2-3    Plan:  1) Warfarin 1.25mg  today as per home regimen 2) Daily INR  Hessie Knows, PharmD, BCPS  Pager [phone] 12/03/2012 8:04 AM

## 2012-12-04 DIAGNOSIS — Z95810 Presence of automatic (implantable) cardiac defibrillator: Secondary | ICD-10-CM

## 2012-12-04 LAB — PROTIME-INR
INR: 2.68 — ABNORMAL HIGH (ref 0.00–1.49)
Prothrombin Time: 27.6 seconds — ABNORMAL HIGH (ref 11.6–15.2)

## 2012-12-04 LAB — URINE CULTURE
Colony Count: NO GROWTH
Culture: NO GROWTH

## 2012-12-04 LAB — BASIC METABOLIC PANEL
BUN: 12 mg/dL (ref 6–23)
Chloride: 110 mEq/L (ref 96–112)
GFR calc Af Amer: >90 mL/min (ref >90)
GFR calc non Af Amer: 81 mL/min — ABNORMAL LOW (ref >90)
Potassium: 3.8 mEq/L (ref 3.5–5.1)
Sodium: 143 mEq/L (ref 135–145)

## 2012-12-04 MED ORDER — OXYCODONE-ACETAMINOPHEN 5-325 MG PO TABS: 1.0000 | ORAL_TABLET | Freq: Four times a day (QID) | ORAL | Status: DC | PRN

## 2012-12-04 NOTE — Discharge Summary (Signed)
Physician Discharge Summary  Mitchell Murray QMV:[id number] DOB: 1940/05/30 DOA: 12/02/2012  PCP: [name], MD  Admit date: 12/02/2012 Discharge date: 12/04/2012  Recommendations for Outpatient Follow-up:  1. please note that your urine culture did not show any growth of bacteria. For this reason he did not need to be on antibiotics at the time of discharge.  2. you may continue taking aspirin 81 mg tablet daily. Watch for any signs of bleed such as blood in the stool or urine or dark stool. Please consult her primary care physician should she experience any signs of bleed.  3. you may continue taking Coreg and Lasix. Your blood pressure at the time of discharge is 134/80  4. your potassium was 3.2 which is slightly lower than the goal range. This is likely due to Lasix. Potassium is within normal limits at the time of discharge. Continue taking potassium supplements per home dose.  5. continue taking Coumadin as previously at home, 2.5 mg for 2 days and then 1.25 mg, cycle  6. continue taking Synthroid 88 mcg daily  7. continue taking Lipitor 10 mg at bedtime   Discharge Diagnoses:  Principal Problem:   Pyelonephritis Active Problems:   Right flank pain   CAD (coronary artery disease)   AICD (automatic cardioverter/defibrillator) present   A-fib   Prostate cancer   Unspecified hypothyroidism   Hypokalemia   Hyperglycemia   Discharge Condition: Medically stable for discharge home today  Diet recommendation: As tolerated  History of present illness:  72 y.o. male with a PMH of prostate cancer, renal calculi, CAD status post AICD, and atrial fibrillation on chronic Coumadin who was admitted with suspected right-sided pyelonephritis on 12/02/2012. Patient had rapid resolution of his flank pain overnight. Urine cultures show no growth to date. Patient has received a total of 3 days of IV Rocephin.  Assessment/Plan:   Principal Problem:  Pyelonephritis  - CT scans reviewed by  admitting physician with radiologist: swelling of Gerotas fascia around the right kidney noted. This can be seen with pyelonephritis but is nonspecific.  - Pain is resolved at this time - Urine cultures negative to date. Patient has received 3 days of IV Rocephin. Antibiotics are not needed at the time of discharge  Active Problems:  Hyperglycemia  - glucose is 126 on BMP Hypokalemia  - Continue potassium supplements per home dose  Right flank pain  - Thought to be secondary to pyelonephritis versus nephrolithiasis. Rapid resolution of pain suggests that he may have passed a kidney stone.  CAD (coronary artery disease) / AICD (automatic cardioverter/defibrillator) present  - Continue aspirin, beta blocker, and statin.  A-fib  - Continue Coumadin per previous regimen at home  Prostate cancer  - Status post prostatectomy in 2008.  Hypothyroidism  - Continue Synthroid 88 mcg daily  Code Status: DNR  Family Communication: No family at the bedside.    Medical Consultants:  None. Other Consultants:  None. Anti-infectives:  Rocephin and 12/02/2012---> 12/04/2012   Discharge Exam: Filed Vitals:   12/04/12 0557  BP: 134/80  Pulse:   Temp:   Resp:    Filed Vitals:   12/03/12 1400 12/03/12 2034 12/04/12 0519 12/04/12 0557  BP: 129/79 143/87 136/91 134/80  Pulse: 76 76 65   Temp: 98.6 F (37 C) 98.3 F (36.8 C) 97.9 F (36.6 C)   TempSrc: Oral Oral Oral   Resp: 16 16 20    Height:      Weight:      SpO2: 99% 96%  96%     General: Pt is alert, follows commands appropriately, not in acute distress Cardiovascular: Regular rate and rhythm, S1/S2 +, no murmurs, no rubs, no gallops Respiratory: Clear to auscultation bilaterally, no wheezing, no crackles, no rhonchi Abdominal: Soft, non tender, non distended, bowel sounds +, no guarding Extremities: LE +1 edema, no cyanosis, pulses palpable bilaterally DP and PT Neuro: Grossly nonfocal  Discharge Instructions  Discharge Orders    Future Orders Complete By Expires     Call MD for:  difficulty breathing, headache or visual disturbances  As directed     Call MD for:  persistant dizziness or light-headedness  As directed     Call MD for:  persistant nausea and vomiting  As directed     Call MD for:  severe uncontrolled pain  As directed     Diet - low sodium heart healthy  As directed     Discharge instructions  As directed     Comments:      1. please note that your urine culture did not show any growth of bacteria. For this reason he did not need to be on antibiotics at the time of discharge. 2. you may continue taking aspirin 81 mg tablet daily. Watch for any signs of bleed such as blood in the stool or urine or dark stool. Please consult her primary care physician should she experience any signs of bleed. 3. you may continue taking Coreg and Lasix. Your blood pressure at the time of discharge is 134/80 4. your potassium was 3.2 which is slightly lower than the goal range. This is likely due to Lasix. Potassium is within normal limits at the time of discharge. Continue taking potassium supplements per home dose. 5. continue taking Coumadin as previously at home, 2.5 mg for 2 days and then 1.25 mg, cycle 6. continue taking Synthroid 88 mcg daily 7. continue taking Lipitor 10 mg at bedtime    Increase activity slowly  As directed         Medication List         aspirin EC 81 MG tablet  Take 81 mg by mouth daily.     atorvastatin 10 MG tablet  Commonly known as:  LIPITOR  Take 10 mg by mouth daily.     carvedilol 25 MG tablet  Commonly known as:  COREG  Take 25 mg by mouth 2 (two) times daily with a meal.     furosemide 40 MG tablet  Commonly known as:  LASIX  Take 40 mg by mouth daily.     GAS-X PO  Take 1 capsule by mouth 2 (two) times daily as needed (gas).     levothyroxine 88 MCG tablet  Commonly known as:  SYNTHROID, LEVOTHROID  Take 88 mcg by mouth daily before breakfast.     omeprazole 40 MG  capsule  Commonly known as:  PRILOSEC  Take 40 mg by mouth daily.     oxyCODONE-acetaminophen 5-325 MG per tablet  Commonly known as:  PERCOCET/ROXICET  Take 1-2 tablets by mouth every 6 (six) hours as needed for pain.     potassium chloride SA 20 MEQ tablet  Commonly known as:  K-DUR,KLOR-CON  Take 10 mEq by mouth daily.     warfarin 2.5 MG tablet  Commonly known as:  COUMADIN  Take 1.25-2.5 mg by mouth daily. 2.5 mg for 2 days then 1.25 mg for 1 day. Pt took 2.5mg  last night     zolpidem 5 MG tablet  Commonly known as:  AMBIEN  Take 5 mg by mouth at bedtime.           Follow-up Information   Follow up with [name], MD In 1 week.   Contact information:   [address] [place] [address] [phone]        The results of significant diagnostics from this hospitalization (including imaging, microbiology, ancillary and laboratory) are listed below for reference.    Significant Diagnostic Studies: Ct Abdomen Pelvis Wo Contrast  12/02/2012   *RADIOLOGY REPORT*  Clinical Data: Flank pain  CT ABDOMEN AND PELVIS WITHOUT CONTRAST  Technique:  Multidetector CT imaging of the abdomen and pelvis was performed following the standard protocol without oral or intravenous contrast.  Comparison: November 28, 2012  Findings:  There is scarring in the lung bases with posterior basilar pleural thickening bilaterally.  There are pacemaker leads attached to the heart.  Heart is enlarged. There are multiple foci of coronary artery calcification.  No focal liver lesions are identified on this nonintravenous contrast enhanced study. There is no biliary duct dilatation. There is sludge in the gallbladder.  Spleen, pancreas, adrenals appear normal.  There is a 3 mm calculus in the posterior mid left kidney, stable. There are no other intrarenal calculi on either side.  There is no hydronephrosis or renal mass on either side.  Mild scarring in Gerota's fascia bilaterally is stable.  In the pelvis,  there is no mass or fluid collection.  A calcification in the inferior aspect of the bladder measuring 8 x 8 mm remains without change.  There are smaller calcifications at the level of the prostatic urethra.  There are multiple sigmoid diverticula without diverticulitis.  Appendix appears normal.  There is no bowel obstruction.  No free air or portal venous air.  There is no ascites, adenopathy, or abscess in the abdomen or pelvis.  There is atherosclerotic change in the aorta iliac arteries but no aneurysm.  There are no appreciable blastic or lytic bone lesions.  IMPRESSION: Calcifications in the inferior bladder and prostatic urethra. These calcifications may represent dystrophic calcifications; calculus is possible, however.  Potentially, direct visualization may be advisable to further evaluate these areas.  There are multiple sigmoid diverticula without diverticulitis.  There is a nonobstructing 3 mm calculus in the left kidney.  There is no hydronephrosis on either side.  No ureteral calculi identified.  There is no abscess or bowel obstruction.  There are multiple foci of coronary artery calcification.  Appendix appears normal.  There is stable scarring in Gerota's fascia bilaterally, a finding of questionable clinical significance.   Original Report Authenticated By: Bretta Bang, M.D.    Microbiology: Recent Results (from the past 240 hour(s))  URINE CULTURE     Status: None   Collection Time    12/02/12  9:29 PM      Result Value Range Status   Specimen Description URINE, RANDOM   Final   Special Requests Normal   Final   Culture  Setup Time     Final   Value: 12/03/2012 03:32     Performed at [facility]   Colony Count     Final   Value: NO GROWTH     Performed at [facility]   Culture     Final   Value: NO GROWTH     Performed at [facility]   Report Status 12/04/2012 FINAL   Final     Labs: Basic Metabolic Panel:  Recent Labs Lab 12/02/12 1150  12/03/12 0336 12/04/12 0343  NA 144 143 143  K 3.4* 3.2* 3.8  CL 107 107 110  CO2 31 30 26   GLUCOSE 182* 137* 126*  BUN 12 10 12   CREATININE 1.03 1.01 0.96  CALCIUM 9.4 8.6 8.8   Liver Function Tests:  Recent Labs Lab 12/02/12 1150  AST 18  ALT 21  ALKPHOS 83  BILITOT 1.2  PROT 6.5  ALBUMIN 3.2*    Recent Labs Lab 12/02/12 1150  LIPASE 21   No results found for this basename: AMMONIA,  in the last 168 hours CBC:  Recent Labs Lab 12/02/12 1150 12/03/12 0336  WBC 7.6 6.9  NEUTROABS 4.5  --   HGB 11.6* 10.5*  HCT 35.7* 32.9*  MCV 88.6 90.6  PLT 255 267   Cardiac Enzymes: No results found for this basename: CKTOTAL, CKMB, CKMBINDEX, TROPONINI,  in the last 168 hours BNP: BNP (last 3 results) No results found for this basename: PROBNP,  in the last 8760 hours CBG: No results found for this basename: GLUCAP,  in the last 168 hours  Time coordinating discharge: Over 30 minutes  Signed:  Manson Passey, MD  [facility]  12/04/2012, 11:31 AM  Pager #: [phone]

## 2012-12-04 NOTE — ED Provider Notes (Signed)
Medical screening examination/treatment/procedure(s) were conducted as a shared visit with non-physician practitioner(s) or resident  and myself.  I personally evaluated the patient during the encounter and agree with the findings and plan unless otherwise indicated.  CT reviewed, no cause of patients sxs.  Concern for MSK vs vascular, unable to perform CT with contrast due to severe allergy.  Plan for observation and pre-treatment.  Patient pain persists on recheck and pt not comfortable with outpt fup.    Enid Skeens, MD 12/04/12 2227

## 2012-12-04 NOTE — Progress Notes (Signed)
Pt. Was discharged home.  He was given his discharge instructions and prescriptions. He was taken outside by wheelchair where he was transported home by his wife.

## 2013-01-10 ENCOUNTER — Other Ambulatory Visit: Payer: Self-pay | Admitting: Urology

## 2013-01-14 ENCOUNTER — Other Ambulatory Visit (HOSPITAL_COMMUNITY): Payer: Self-pay | Admitting: Urology

## 2013-01-14 NOTE — Patient Instructions (Addendum)
20 Mitchell Murray  01/14/2013   Your procedure is scheduled on: 01-24-2013  Friday  Report to [facility] at 615 AM.  Call this number if you have problems the morning of surgery [phone]   Remember:   Do not eat food or drink liquids :After Midnight. Thursday night     Take these medicines the morning of surgery with A SIP OF WATER: CARVEDILOL, LEVOTHYROXINE, OMEPRAZOLE                                                                        SEE [place] PREPARING FOR SURGERY SHEET             You may not have any metal on your body including hair pins and piercings  Do not wear jewelry, make-up.  Do not wear lotions, powders, or perfumes. You may wear deodorant.   Men may shave face and neck.  Do not bring valuables to the hospital. [name] IS NOT RESPONSIBLE FOR VALUEABLES.  Contacts, dentures or bridgework may not be worn into surgery.  Leave suitcase in the car. After surgery it may be brought to your room.  For patients admitted to the hospital, checkout time is 11:00 AM the day of discharge.   Patients discharged the day of surgery will not be allowed to drive home.  Name and phone number of your driver:  Special Instructions: N/A     Call Theodis Aguas RN pre op nurse if needed [phone]    FAILURE TO FOLLOW THESE INSTRUCTIONS MAY RESULT IN THE CANCELLATION OF YOUR SURGERY.  PATIENT SIGNATURE___________________________________________  NURSE SIGNATURE_____________________________________________

## 2013-01-14 NOTE — Progress Notes (Signed)
lov note dr Bing Matter 09-18-12 on chart Last pacer check 01-02-13 note on chart Echo 10-27-12 [facility] on chart Pacer orders dr Rudolpho Sevin on chart

## 2013-01-15 ENCOUNTER — Encounter (HOSPITAL_COMMUNITY)
Admission: RE | Admit: 2013-01-15 | Discharge: 2013-01-15 | Disposition: A | Discharge status: Home / Self Care | Payer: Medicare Other | Source: Ambulatory Visit | Attending: Orthopaedic Surgery | Admitting: Orthopaedic Surgery

## 2013-01-15 ENCOUNTER — Encounter (HOSPITAL_COMMUNITY): Payer: Self-pay

## 2013-01-15 ENCOUNTER — Encounter (HOSPITAL_COMMUNITY): Payer: Self-pay | Admitting: Pharmacy Technician

## 2013-01-15 ENCOUNTER — Ambulatory Visit (HOSPITAL_COMMUNITY)
Admission: RE | Admit: 2013-01-15 | Discharge: 2013-01-15 | Disposition: A | Discharge status: Home / Self Care | Payer: Medicare Other | Source: Ambulatory Visit | Attending: Urology | Admitting: Urology

## 2013-01-15 DIAGNOSIS — Z01818 Encounter for other preprocedural examination: Secondary | ICD-10-CM | POA: Insufficient documentation

## 2013-01-15 DIAGNOSIS — R0989 Other specified symptoms and signs involving the circulatory and respiratory systems: Secondary | ICD-10-CM | POA: Insufficient documentation

## 2013-01-15 DIAGNOSIS — Z01810 Encounter for preprocedural cardiovascular examination: Secondary | ICD-10-CM | POA: Insufficient documentation

## 2013-01-15 DIAGNOSIS — N210 Calculus in bladder: Secondary | ICD-10-CM | POA: Insufficient documentation

## 2013-01-15 DIAGNOSIS — Z01812 Encounter for preprocedural laboratory examination: Secondary | ICD-10-CM | POA: Insufficient documentation

## 2013-01-15 HISTORY — DX: Unspecified hearing loss, unspecified ear: H91.90

## 2013-01-15 HISTORY — DX: Cardiac arrhythmia, unspecified: I49.9

## 2013-01-15 HISTORY — DX: Presence of cardiac pacemaker: Z95.0

## 2013-01-15 HISTORY — DX: Gastro-esophageal reflux disease without esophagitis: K21.9

## 2013-01-15 HISTORY — DX: Cardiomegaly: I51.7

## 2013-01-15 HISTORY — DX: Heart failure, unspecified: I50.9

## 2013-01-15 HISTORY — DX: Presence of automatic (implantable) cardiac defibrillator: Z95.810

## 2013-01-15 HISTORY — DX: Hypothyroidism, unspecified: E03.9

## 2013-01-15 LAB — BASIC METABOLIC PANEL
BUN: 11 mg/dL (ref 6–23)
CO2: 31 mEq/L (ref 19–32)
Glucose, Bld: 153 mg/dL — ABNORMAL HIGH (ref 70–99)
Potassium: 4.1 mEq/L (ref 3.5–5.1)
Sodium: 146 mEq/L — ABNORMAL HIGH (ref 135–145)

## 2013-01-15 LAB — CBC
Hemoglobin: 12.1 g/dL — ABNORMAL LOW (ref 13.0–17.0)
MCH: 27.9 pg (ref 26.0–34.0)
MCHC: 31.9 g/dL (ref 30.0–36.0)
MCV: 87.5 fL (ref 78.0–100.0)
RBC: 4.33 MIL/uL (ref 4.22–5.81)

## 2013-01-20 ENCOUNTER — Other Ambulatory Visit: Payer: Self-pay | Admitting: Urology

## 2013-01-20 DIAGNOSIS — Z7901 Long term (current) use of anticoagulants: Secondary | ICD-10-CM

## 2013-01-22 NOTE — Progress Notes (Signed)
EKG reviewed from 01/15/13 by Dr Willa Frater.   Received d/c summary [facility] 09/22/11 with EKG from 5/22 and 09/20/12. Placed on chart

## 2013-01-22 NOTE — Progress Notes (Signed)
Late entry for 01/15/13-  Patient stated will be stopping coumadin and beginning Lovonox pre op, states has appt for instructions regarding this.

## 2013-01-24 ENCOUNTER — Encounter (HOSPITAL_COMMUNITY)
Admission: RE | Disposition: A | Discharge status: Home / Self Care | Payer: Self-pay | Source: Ambulatory Visit | Attending: Urology

## 2013-01-24 ENCOUNTER — Ambulatory Visit (HOSPITAL_COMMUNITY): Payer: Medicare Other | Admitting: Certified Registered Nurse Anesthetist

## 2013-01-24 ENCOUNTER — Encounter (HOSPITAL_COMMUNITY): Payer: Self-pay | Admitting: *Deleted

## 2013-01-24 ENCOUNTER — Ambulatory Visit (HOSPITAL_COMMUNITY)
Admission: RE | Admit: 2013-01-24 | Discharge: 2013-01-24 | Disposition: A | Discharge status: Home / Self Care | Payer: Medicare Other | Source: Ambulatory Visit | Attending: Urology | Admitting: Urology

## 2013-01-24 ENCOUNTER — Encounter (HOSPITAL_COMMUNITY): Payer: Self-pay | Admitting: Certified Registered Nurse Anesthetist

## 2013-01-24 DIAGNOSIS — I252 Old myocardial infarction: Secondary | ICD-10-CM | POA: Insufficient documentation

## 2013-01-24 DIAGNOSIS — E039 Hypothyroidism, unspecified: Secondary | ICD-10-CM | POA: Insufficient documentation

## 2013-01-24 DIAGNOSIS — I10 Essential (primary) hypertension: Secondary | ICD-10-CM | POA: Insufficient documentation

## 2013-01-24 DIAGNOSIS — IMO0002 Reserved for concepts with insufficient information to code with codable children: Secondary | ICD-10-CM | POA: Insufficient documentation

## 2013-01-24 DIAGNOSIS — Z8546 Personal history of malignant neoplasm of prostate: Secondary | ICD-10-CM | POA: Insufficient documentation

## 2013-01-24 DIAGNOSIS — Z7982 Long term (current) use of aspirin: Secondary | ICD-10-CM | POA: Insufficient documentation

## 2013-01-24 DIAGNOSIS — Z7901 Long term (current) use of anticoagulants: Secondary | ICD-10-CM | POA: Insufficient documentation

## 2013-01-24 DIAGNOSIS — N200 Calculus of kidney: Secondary | ICD-10-CM | POA: Insufficient documentation

## 2013-01-24 DIAGNOSIS — Z79899 Other long term (current) drug therapy: Secondary | ICD-10-CM | POA: Insufficient documentation

## 2013-01-24 DIAGNOSIS — Z95810 Presence of automatic (implantable) cardiac defibrillator: Secondary | ICD-10-CM | POA: Insufficient documentation

## 2013-01-24 DIAGNOSIS — N529 Male erectile dysfunction, unspecified: Secondary | ICD-10-CM | POA: Insufficient documentation

## 2013-01-24 DIAGNOSIS — K219 Gastro-esophageal reflux disease without esophagitis: Secondary | ICD-10-CM | POA: Insufficient documentation

## 2013-01-24 DIAGNOSIS — T190XXA Foreign body in urethra, initial encounter: Secondary | ICD-10-CM | POA: Insufficient documentation

## 2013-01-24 DIAGNOSIS — N210 Calculus in bladder: Secondary | ICD-10-CM | POA: Insufficient documentation

## 2013-01-24 HISTORY — PX: HOLMIUM LASER APPLICATION: SHX5852

## 2013-01-24 HISTORY — PX: CYSTOSCOPY WITH LITHOLAPAXY: SHX1425

## 2013-01-24 LAB — PROTIME-INR
INR: 1.19 (ref 0.00–1.49)
Prothrombin Time: 14.8 seconds (ref 11.6–15.2)

## 2013-01-24 SURGERY — CYSTOSCOPY, WITH BLADDER CALCULUS LITHOLAPAXY
Anesthesia: General | Site: Bladder | Wound class: Clean Contaminated

## 2013-01-24 MED ORDER — HYDROMORPHONE HCL PF 1 MG/ML IJ SOLN: 0.2500 mg | INTRAMUSCULAR | Status: DC | PRN

## 2013-01-24 MED ORDER — LIDOCAINE HCL 2 % EX GEL
CUTANEOUS | Status: DC | PRN
  Administered 2013-01-24: 1 via URETHRAL

## 2013-01-24 MED ORDER — OXYCODONE HCL 5 MG/5ML PO SOLN
5.0000 mg | Freq: Once | ORAL | Status: DC | PRN
  Filled 2013-01-24: qty 5

## 2013-01-24 MED ORDER — BELLADONNA ALKALOIDS-OPIUM 16.2-60 MG RE SUPP
RECTAL | Status: AC
  Filled 2013-01-24: qty 1

## 2013-01-24 MED ORDER — EPHEDRINE SULFATE 50 MG/ML IJ SOLN
INTRAMUSCULAR | Status: DC | PRN
  Administered 2013-01-24: 10 mg via INTRAVENOUS

## 2013-01-24 MED ORDER — SODIUM CHLORIDE 0.9 % IR SOLN
Status: DC | PRN
  Administered 2013-01-24: 9000 mL via INTRAVESICAL

## 2013-01-24 MED ORDER — LIDOCAINE HCL (CARDIAC) 20 MG/ML IV SOLN
INTRAVENOUS | Status: DC | PRN
  Administered 2013-01-24: 100 mg via INTRAVENOUS

## 2013-01-24 MED ORDER — ONDANSETRON HCL 4 MG/2ML IJ SOLN
INTRAMUSCULAR | Status: DC | PRN
  Administered 2013-01-24: 4 mg via INTRAVENOUS

## 2013-01-24 MED ORDER — MIDAZOLAM HCL 5 MG/5ML IJ SOLN
INTRAMUSCULAR | Status: DC | PRN
  Administered 2013-01-24: 1 mg via INTRAVENOUS

## 2013-01-24 MED ORDER — LIDOCAINE HCL 2 % EX GEL
CUTANEOUS | Status: AC
  Filled 2013-01-24: qty 10

## 2013-01-24 MED ORDER — MEPERIDINE HCL 50 MG/ML IJ SOLN: 6.2500 mg | INTRAMUSCULAR | Status: DC | PRN

## 2013-01-24 MED ORDER — FENTANYL CITRATE 0.05 MG/ML IJ SOLN
INTRAMUSCULAR | Status: DC | PRN
  Administered 2013-01-24 (×2): 25 ug via INTRAVENOUS
  Administered 2013-01-24: 50 ug via INTRAVENOUS

## 2013-01-24 MED ORDER — PROPOFOL 10 MG/ML IV BOLUS
INTRAVENOUS | Status: DC | PRN
  Administered 2013-01-24: 150 mg via INTRAVENOUS

## 2013-01-24 MED ORDER — PROMETHAZINE HCL 25 MG/ML IJ SOLN: 6.2500 mg | INTRAMUSCULAR | Status: DC | PRN

## 2013-01-24 MED ORDER — BELLADONNA ALKALOIDS-OPIUM 16.2-60 MG RE SUPP
RECTAL | Status: DC | PRN
  Administered 2013-01-24: 1 via RECTAL

## 2013-01-24 MED ORDER — OXYCODONE HCL 5 MG PO TABS: 5.0000 mg | ORAL_TABLET | Freq: Once | ORAL | Status: DC | PRN

## 2013-01-24 MED ORDER — CIPROFLOXACIN IN D5W 400 MG/200ML IV SOLN
400.0000 mg | INTRAVENOUS | Status: AC
  Administered 2013-01-24: 400 mg via INTRAVENOUS

## 2013-01-24 MED ORDER — ENOXAPARIN SODIUM 100 MG/ML ~~LOC~~ SOLN: 100.0000 mg | Freq: Every day | SUBCUTANEOUS | Status: DC

## 2013-01-24 MED ORDER — OXYCODONE HCL 5 MG PO TABS: 5.0000 mg | ORAL_TABLET | ORAL | Status: DC | PRN

## 2013-01-24 MED ORDER — LACTATED RINGERS IV SOLN
INTRAVENOUS | Status: DC | PRN
  Administered 2013-01-24: 08:00:00 via INTRAVENOUS

## 2013-01-24 MED ORDER — DOCUSATE SODIUM 50 MG PO CAPS: 100.0000 mg | ORAL_CAPSULE | Freq: Two times a day (BID) | ORAL | Status: DC | PRN

## 2013-01-24 MED ORDER — CIPROFLOXACIN IN D5W 400 MG/200ML IV SOLN
INTRAVENOUS | Status: AC
  Filled 2013-01-24: qty 200

## 2013-01-24 MED ORDER — PHENYLEPHRINE HCL 10 MG/ML IJ SOLN
INTRAMUSCULAR | Status: DC | PRN
  Administered 2013-01-24 (×3): 80 ug via INTRAVENOUS

## 2013-01-24 SURGICAL SUPPLY — 18 items
BAG URINE DRAINAGE (UROLOGICAL SUPPLIES) ×1 IMPLANT
BAG URO CATCHER STRL LF (DRAPE) ×3 IMPLANT
CATH FOLEY 2WAY SLVR  5CC 18FR (CATHETERS) ×1
CATH FOLEY 2WAY SLVR 5CC 18FR (CATHETERS) IMPLANT
CATH FOLEY 3WAY  5CC 18FR (CATHETERS)
CATH FOLEY 3WAY 5CC 18FR (CATHETERS) IMPLANT
CATH INTERMIT  6FR 70CM (CATHETERS) ×1 IMPLANT
CLOTH BEACON ORANGE TIMEOUT ST (SAFETY) ×3 IMPLANT
DRAPE CAMERA CLOSED 9X96 (DRAPES) ×3 IMPLANT
FIBER LASER FLEXIVA 550 (UROLOGICAL SUPPLIES) ×1 IMPLANT
GLOVE BIOGEL M 8.0 STRL (GLOVE) ×3 IMPLANT
GOWN PREVENTION PLUS XLARGE (GOWN DISPOSABLE) ×2 IMPLANT
GOWN STRL REIN XL XLG (GOWN DISPOSABLE) ×3 IMPLANT
GUIDEWIRE STR DUAL SENSOR (WIRE) ×1 IMPLANT
MANIFOLD NEPTUNE II (INSTRUMENTS) ×3 IMPLANT
PACK CYSTO (CUSTOM PROCEDURE TRAY) ×3 IMPLANT
SYRINGE IRR TOOMEY STRL 70CC (SYRINGE) IMPLANT
TUBING CONNECTING 10 (TUBING) IMPLANT

## 2013-01-24 NOTE — Op Note (Signed)
Preoperative diagnosis:  1. Bladder stone   Postoperative diagnosis:  1. As above    Procedure:  Cystourethroscopy, cystolitholapaxy with laser fragmentation  Surgeon: Crist Fat, MD  Anesthesia: General  Complications: None  Intraoperative findings: Small thin web of soft tissue and bulbar urethra requiring dilation. Several stone fragments within the bladder containing small staples. Several additional stable seen right at the bladder neck within the right lateral wall. A large Hemoclip at the 6:00 position with a large stone encasing it.  EBL: Minimal  Specimens: None  Indication: Mitchell Murray is a 72 y.o. patient with a bladder stone and lower urinary tract symptoms.  After reviewing the management options for treatment, he elected to proceed with the above surgical procedure(s). We have discussed the potential benefits and risks of the procedure, side effects of the proposed treatment, the likelihood of the patient achieving the goals of the procedure, and any potential problems that might occur during the procedure or recuperation. Informed consent has been obtained.  Description of procedure:  The patient was taken to the operating room and general anesthesia was induced.  The patient was placed in the dorsal lithotomy position, prepped and draped in the usual sterile fashion, and preoperative antibiotics were administered. A preoperative time-out was performed.   Using a 30 lens with a 22 French sheath the cystoscope was passed through the urethra to the bulbar urethra. There was a soft thin web of tissue across the urethra which was not easily navigated with gentle pressure. I then placed a wire through this area and into the bladder. I confirmed that the wire was in the bladder by passing an open-ended ureteral catheter over the wire and into the bladder removing the wire and getting urine return. I then reinserted the wire and backloaded the 17 French sheath over  the wire and gently passed through this web of tissue. Once in the bladder I then removed the 17 French cystoscopic sheath and repassed the 22 French cystoscope through the urethra and into the bladder. I  then performed 360 cystoscopic evaluation, the bladder mucosa appeared to be normal without any additional tumors or abnormalities. There were several small stones floating freely within the bladder that were encasing vascular staples, the larger stone in the 6:00 position was encasing a hemologic plastic surgical clip which was emanating from the bladder neck.  Using the 550  fiber with settings of 0.5 and 20 was able to dust the larger stone off of the clip. I then gently manipulated the clip out of the tissue using biopsy forceps and was then able to remove the clip through the cystoscopic sheath. The smaller free-floating stones were removed without requiring manipulation/laser fragmentation. I did have to fragment one into several smaller pieces that would easily pass through the cystoscopic sheath. All the stone fragments and staples were then removed. An 50 French Foley catheter was placed at the end of the case. The patient was subsequently awoken and returned to PACU in stable condition. There no immediate complications.  Disposition: Patient will be discharged home today with a Foley catheter. We'll schedule the patient for a voiding trial 5-7 days in our office.  Crist Fat, M.D.

## 2013-01-24 NOTE — H&P (Signed)
Active Problems Problems  1. Bladder Calculus 594.1 2. Flank Pain Right 3. Nephrolithiasis Of The Right Kidney 592.0 4. Organic Impotence 607.84 5. History of  Prostate Cancer V10.46  History of Present Illness      [name] is s/p a robotic radical prostatectomy for a T2a Gleason 6 prostate cancer in 12/06. His PSA's have been undetectible and was <0.01in 7/13. He has good continence and has ED but is not currently on therapy but would like to resume trimix.    He has a history of stones and was seen on 12/09/12 for right flank pain.  A CT showed a small left renal stone and possible bladder stone on 8/4.  He finds the pain is better but worse with riding in a car for a long time.   He has a new complaint of progressive slowing of the stream over the last month.  He has no frequency.   He has no nocturia.   He has no incontinence.  He has no hematuria or dysuria.   On my review of his CT he has an 8mm bladder stone that is probably causing partial obstruction.         Past Medical History Problems  1. History of  Acute Myocardial Infarction V12.59 2. History of  Bladder Calculus V13.01 3. History of  Nephrolithiasis V13.01 4. History of  Prostate Cancer V10.46 5. History of  Skin Cancer V10.83  Surgical History Problems  1. History of  Biopsy Skin 2. History of  Cardio-defib Pulse Generator Implantation Date 3. History of  Prostatect Retropubic Radical W/ Nerve Sparing Laparoscopic 4. History of  Umbilical Hernia Repair  Current Meds 1. Aspirin 81 MG Oral Tablet; Therapy: (Recorded:02Dec2009) to 2. Atorvastatin Calcium 10 MG Oral Tablet; Therapy: 15Dec2012 to 3. Carvedilol 25 MG Oral Tablet; Therapy: 24May2013 to 4. Digestive Enzymes TABS; Therapy: (Recorded:11Aug2014) to 5. Furosemide 40 MG Oral Tablet; Therapy: (Recorded:11Aug2014) to 6. Klor-Con M20 20 MEQ Oral Tablet Extended Release; Therapy: 15Dec2012 to 7. Levothyroxine Sodium 88 MCG Oral Tablet; Therapy: 03Mar2014  to 8. Omeprazole 40 MG Oral Capsule Delayed Release; Therapy: 02Jan2014 to 9. Oxycodone-Acetaminophen 5-325 MG Oral Tablet; Therapy: 06Aug2014 to 10. Warfarin Sodium 2.5 MG Oral Tablet; Therapy: 16Jul2014 to 11. Zolpidem Tartrate 5 MG Oral Tablet; Therapy: 29Jul2014 to  Allergies Medication  1. Contrast Media Ready-Box MISC 2. Viagra TABS  Social History Problems  1. Caffeine Use 2 2. Marital History - Currently Married 3. Occupation: retired Psychologist, sport and exercise  4. History of  Alcohol Use 5. History of  Tobacco Use V15.82  Review of Systems Genitourinary, constitutional, skin, eye, otolaryngeal, hematologic/lymphatic, cardiovascular, pulmonary, endocrine, musculoskeletal, gastrointestinal, neurological and psychiatric system(s) were reviewed and pertinent findings if present are noted.  Genitourinary: weak urinary stream, but no hematuria.  Gastrointestinal: no diarrhea and no constipation.  Constitutional: no fever and no recent weight loss.  Cardiovascular: no chest pain.  Respiratory: shortness of breath during exertion, but no shortness of breath.  Musculoskeletal: back pain.    Vitals Vital Signs [Data Includes: Last 1 Day]  08Sep2014 12:34PM  Blood Pressure: 148 / 93 Temperature: 96.7 F Heart Rate: 79  Physical Exam Constitutional: Well nourished and well developed . No acute distress.  Pulmonary: No respiratory distress and normal respiratory rhythm and effort.  Cardiovascular: Heart rate and rhythm are normal . No peripheral edema.  Abdomen: No masses are palpated. The abdomen is not firm, not rigid, no rebound and no guarding. Mild tenderness in the RLQ is present. No  CVA tenderness. No hernias are palpable. No hepatosplenomegaly noted.  Lymphatics: The supraclavicular nodes are not enlarged or tender.    Results/Data Urine [Data Includes: Last 1 Day]   08Sep2014  COLOR YELLOW   APPEARANCE CLEAR   SPECIFIC GRAVITY 1.015   pH 5.0   GLUCOSE NEG mg/dL   BILIRUBIN NEG   KETONE NEG mg/dL  BLOOD NEG   PROTEIN NEG mg/dL  UROBILINOGEN 0.2 mg/dL  NITRITE NEG   LEUKOCYTE ESTERASE NEG    The following images/tracing/specimen were independently visualized:  I have reviewed his CT films and he has an 8mm bladder stone. He has a small left renal stone without obstruction.  The following clinical lab reports were reviewed:  UA reviewed.    Assessment Assessed  1. Bladder Calculus 594.1 2. Nephrolithiasis Of The Right Kidney 592.0 3. History of  Prostate Cancer V10.46 4. Organic Impotence 607.84   He has some progressive voiding symptoms with a bladder stone. He has ED and would like a refill on trimix. He is due for a PSA.   Plan Bladder Calculus (594.1)  1. Follow-up Schedule Surgery Office  Follow-up  Requested for: 08Sep2014 Health Maintenance (V70.0)  2. UA With REFLEX  Done: 08Sep2014 12:23PM Organic Impotence (607.84)  3. Alprostadil 500 MCG/ML Injection Solution; trimix 10ml with 30mg  papaverine, 0.5mg  regitine and  prostin/mlinject 1ml prn up to 3 times weekly; Therapy: 02Dec2009 to (Last Rx:08Sep2014)  Requested for:  08Sep2014   I am going to set him up for a cystolithalopaxy for the stone.   He will need to be off of warfarin for the procedure.    I reviewed the risks of bleeding, infection, bladder injury, incontinence, thrombotic events and anesthetic complications.

## 2013-01-24 NOTE — Interval H&P Note (Signed)
History and Physical Interval Note:  01/24/2013 8:54 AM  Mitchell Murray  has presented today for surgery, with the diagnosis of bladder stone  The various methods of treatment have been discussed with the patient and family. After consideration of risks, benefits and other options for treatment, the patient has consented to  Procedure(s): CYSTOSCOPY WITH LITHOLAPAXY (N/A) as a surgical intervention .  The patient's history has been reviewed, patient examined, no change in status, stable for surgery.  I have reviewed the patient's chart and labs.  Questions were answered to the patient's satisfaction.     Berniece Salines W

## 2013-01-24 NOTE — Anesthesia Preprocedure Evaluation (Addendum)
Anesthesia Evaluation  Patient identified by MRN, date of birth, ID band Patient awake    Reviewed: Allergy & Precautions, H&P , NPO status , Patient's Chart, lab work & pertinent test results, reviewed documented beta blocker date and time   Airway Mallampati: II TM Distance: >3 FB Neck ROM: Full    Dental  (+) Dental Advisory Given and Teeth Intact   Pulmonary neg pulmonary ROS,  breath sounds clear to auscultation        Cardiovascular hypertension, Pt. on medications and Pt. on home beta blockers + CAD, + Past MI and +CHF + dysrhythmias + pacemaker + Cardiac Defibrillator Rhythm:Regular Rate:Normal  Dilated cardiomyopathy EF 30-40%. BiV/AICD. Pacer dependent.    Neuro/Psych negative neurological ROS  negative psych ROS   GI/Hepatic Neg liver ROS, GERD-  Medicated,  Endo/Other  Hypothyroidism   Renal/GU Renal disease     Musculoskeletal negative musculoskeletal ROS (+)   Abdominal   Peds  Hematology negative hematology ROS (+)   Anesthesia Other Findings   Reproductive/Obstetrics                          Anesthesia Physical Anesthesia Plan  ASA: III  Anesthesia Plan: General   Post-op Pain Management:    Induction: Intravenous  Airway Management Planned: LMA  Additional Equipment:   Intra-op Plan:   Post-operative Plan: Extubation in OR  Informed Consent: I have reviewed the patients History and Physical, chart, labs and discussed the procedure including the risks, benefits and alternatives for the proposed anesthesia with the patient or authorized representative who has indicated his/her understanding and acceptance.   Dental advisory given  Plan Discussed with: CRNA  Anesthesia Plan Comments:        Anesthesia Quick Evaluation

## 2013-01-24 NOTE — Transfer of Care (Signed)
Immediate Anesthesia Transfer of Care Note  Patient: Mitchell Murray  Procedure(s) Performed: Procedure(s) (LRB): CYSTOSCOPY WITH LITHOLAPAXY (N/A) HOLMIUM LASER APPLICATION (N/A)  Patient Location: PACU  Anesthesia Type: General  Level of Consciousness: sedated, patient cooperative and responds to stimulation  Airway & Oxygen Therapy: Patient Spontanous Breathing and Patient connected to face mask oxgen  Post-op Assessment: Report given to PACU RN and Post -op Vital signs reviewed and stable  Post vital signs: Reviewed and stable  Complications: No apparent anesthesia complications

## 2013-01-24 NOTE — Preoperative (Signed)
Beta Blockers   Reason not to administer Beta Blockers:Not Applicable Pt took Beta Blocker this morning 01-24-13

## 2013-01-24 NOTE — Anesthesia Postprocedure Evaluation (Signed)
Anesthesia Post Note  Patient: Mitchell Murray  Procedure(s) Performed: Procedure(s) (LRB): CYSTOSCOPY WITH LITHOLAPAXY (N/A) HOLMIUM LASER APPLICATION (N/A)  Anesthesia type: General  Patient location: PACU  Post pain: Pain level controlled  Post assessment: Post-op Vital signs reviewed  Last Vitals: BP 132/86  Pulse 77  Temp(Src) 36.1 C (Oral)  Resp 20  SpO2 99%  Post vital signs: Reviewed  Level of consciousness: sedated  Complications: No apparent anesthesia complications

## 2013-01-27 ENCOUNTER — Encounter (HOSPITAL_COMMUNITY): Payer: Self-pay | Admitting: Urology

## 2014-06-12 ENCOUNTER — Inpatient Hospital Stay (HOSPITAL_COMMUNITY)
Admission: EM | Admit: 2014-06-12 | Discharge: 2014-06-15 | DRG: 309 | Disposition: A | Discharge status: Home / Self Care | Payer: Medicare Other | Attending: Internal Medicine | Admitting: Internal Medicine

## 2014-06-12 ENCOUNTER — Inpatient Hospital Stay (HOSPITAL_COMMUNITY): Payer: Medicare Other

## 2014-06-12 ENCOUNTER — Encounter (HOSPITAL_COMMUNITY): Payer: Self-pay

## 2014-06-12 DIAGNOSIS — Z8546 Personal history of malignant neoplasm of prostate: Secondary | ICD-10-CM

## 2014-06-12 DIAGNOSIS — D72829 Elevated white blood cell count, unspecified: Secondary | ICD-10-CM | POA: Diagnosis present

## 2014-06-12 DIAGNOSIS — Z95810 Presence of automatic (implantable) cardiac defibrillator: Secondary | ICD-10-CM

## 2014-06-12 DIAGNOSIS — Z8249 Family history of ischemic heart disease and other diseases of the circulatory system: Secondary | ICD-10-CM

## 2014-06-12 DIAGNOSIS — H9190 Unspecified hearing loss, unspecified ear: Secondary | ICD-10-CM | POA: Diagnosis present

## 2014-06-12 DIAGNOSIS — I5022 Chronic systolic (congestive) heart failure: Secondary | ICD-10-CM | POA: Diagnosis present

## 2014-06-12 DIAGNOSIS — E119 Type 2 diabetes mellitus without complications: Secondary | ICD-10-CM | POA: Diagnosis present

## 2014-06-12 DIAGNOSIS — Z7901 Long term (current) use of anticoagulants: Secondary | ICD-10-CM | POA: Diagnosis not present

## 2014-06-12 DIAGNOSIS — C61 Malignant neoplasm of prostate: Secondary | ICD-10-CM | POA: Diagnosis present

## 2014-06-12 DIAGNOSIS — E038 Other specified hypothyroidism: Secondary | ICD-10-CM

## 2014-06-12 DIAGNOSIS — Z79891 Long term (current) use of opiate analgesic: Secondary | ICD-10-CM | POA: Diagnosis not present

## 2014-06-12 DIAGNOSIS — I2510 Atherosclerotic heart disease of native coronary artery without angina pectoris: Secondary | ICD-10-CM | POA: Diagnosis present

## 2014-06-12 DIAGNOSIS — Z809 Family history of malignant neoplasm, unspecified: Secondary | ICD-10-CM

## 2014-06-12 DIAGNOSIS — K210 Gastro-esophageal reflux disease with esophagitis: Secondary | ICD-10-CM

## 2014-06-12 DIAGNOSIS — I951 Orthostatic hypotension: Secondary | ICD-10-CM | POA: Diagnosis present

## 2014-06-12 DIAGNOSIS — Z79899 Other long term (current) drug therapy: Secondary | ICD-10-CM

## 2014-06-12 DIAGNOSIS — E039 Hypothyroidism, unspecified: Secondary | ICD-10-CM | POA: Diagnosis present

## 2014-06-12 DIAGNOSIS — I10 Essential (primary) hypertension: Secondary | ICD-10-CM

## 2014-06-12 DIAGNOSIS — I252 Old myocardial infarction: Secondary | ICD-10-CM

## 2014-06-12 DIAGNOSIS — E860 Dehydration: Secondary | ICD-10-CM | POA: Diagnosis present

## 2014-06-12 DIAGNOSIS — E785 Hyperlipidemia, unspecified: Secondary | ICD-10-CM | POA: Diagnosis present

## 2014-06-12 DIAGNOSIS — Z91041 Radiographic dye allergy status: Secondary | ICD-10-CM | POA: Diagnosis not present

## 2014-06-12 DIAGNOSIS — I4891 Unspecified atrial fibrillation: Secondary | ICD-10-CM | POA: Diagnosis present

## 2014-06-12 DIAGNOSIS — R778 Other specified abnormalities of plasma proteins: Secondary | ICD-10-CM | POA: Diagnosis present

## 2014-06-12 DIAGNOSIS — K219 Gastro-esophageal reflux disease without esophagitis: Secondary | ICD-10-CM | POA: Diagnosis present

## 2014-06-12 DIAGNOSIS — Z7982 Long term (current) use of aspirin: Secondary | ICD-10-CM

## 2014-06-12 DIAGNOSIS — I248 Other forms of acute ischemic heart disease: Secondary | ICD-10-CM | POA: Diagnosis present

## 2014-06-12 DIAGNOSIS — Z66 Do not resuscitate: Secondary | ICD-10-CM | POA: Diagnosis present

## 2014-06-12 DIAGNOSIS — R079 Chest pain, unspecified: Secondary | ICD-10-CM

## 2014-06-12 DIAGNOSIS — R7989 Other specified abnormal findings of blood chemistry: Secondary | ICD-10-CM

## 2014-06-12 DIAGNOSIS — R531 Weakness: Secondary | ICD-10-CM

## 2014-06-12 DIAGNOSIS — Z87442 Personal history of urinary calculi: Secondary | ICD-10-CM

## 2014-06-12 DIAGNOSIS — I481 Persistent atrial fibrillation: Secondary | ICD-10-CM

## 2014-06-12 LAB — TROPONIN I
Troponin I: 0.05 ng/mL — ABNORMAL HIGH (ref <0.031)
Troponin I: 0.06 ng/mL — ABNORMAL HIGH (ref <0.031)

## 2014-06-12 LAB — BASIC METABOLIC PANEL
Anion gap: 13 (ref 5–15)
BUN: 25 mg/dL — AB (ref 6–23)
CHLORIDE: 99 mmol/L (ref 96–112)
CO2: 27 mmol/L (ref 19–32)
Calcium: 9.8 mg/dL (ref 8.4–10.5)
Creatinine, Ser: 1.1 mg/dL (ref 0.50–1.35)
GFR calc Af Amer: 75 mL/min — ABNORMAL LOW (ref >90)
GFR calc non Af Amer: 65 mL/min — ABNORMAL LOW (ref >90)
GLUCOSE: 130 mg/dL — AB (ref 70–99)
POTASSIUM: 4.7 mmol/L (ref 3.5–5.1)
Sodium: 139 mmol/L (ref 135–145)

## 2014-06-12 LAB — CBC
HEMATOCRIT: 43.6 % (ref 39.0–52.0)
Hemoglobin: 14.4 g/dL (ref 13.0–17.0)
MCH: 30 pg (ref 26.0–34.0)
MCHC: 33 g/dL (ref 30.0–36.0)
MCV: 90.8 fL (ref 78.0–100.0)
Platelets: 436 10*3/uL — ABNORMAL HIGH (ref 150–400)
RBC: 4.8 MIL/uL (ref 4.22–5.81)
RDW: 15.2 % (ref 11.5–15.5)
WBC: 10.8 10*3/uL — ABNORMAL HIGH (ref 4.0–10.5)

## 2014-06-12 LAB — DIGOXIN LEVEL: DIGOXIN LVL: 1.3 ng/mL (ref 0.8–2.0)

## 2014-06-12 LAB — I-STAT TROPONIN, ED: TROPONIN I, POC: 0.05 ng/mL (ref 0.00–0.08)

## 2014-06-12 LAB — PROTIME-INR
INR: 2.55 — AB (ref 0.00–1.49)
PROTHROMBIN TIME: 27.6 s — AB (ref 11.6–15.2)

## 2014-06-12 LAB — BRAIN NATRIURETIC PEPTIDE: B Natriuretic Peptide: 60.5 pg/mL (ref 0.0–100.0)

## 2014-06-12 MED ORDER — OXYCODONE-ACETAMINOPHEN 5-325 MG PO TABS
1.0000 | ORAL_TABLET | Freq: Once | ORAL | Status: AC
Start: 2014-06-12 — End: 2014-06-12
  Administered 2014-06-12: 1 via ORAL
  Filled 2014-06-12: qty 1

## 2014-06-12 NOTE — ED Notes (Signed)
Patient states dizziness X7 weeks. Patient is c/o headache at this time. A&OX4. No needs voiced.

## 2014-06-12 NOTE — H&P (Addendum)
[name] History and Physical  Mitchell Murray WER:[id number] DOB: May 10, 1940 DOA: 06/12/2014  Referring physician: ED physician PCP: Gilford Rile, MD  Specialists:   Chief Complaint: Generalized weakness and dizziness,  HPI: Mitchell Murray is a 74 y.o. male with past medical history of AICD placement, hypertension, hyperlipidemia, hypothyroidism, GERD, congestive heart failure, atrial fibrillation on Coumadin, history of prostate cancer, coronary artery disease, who presents with a generalized weakness, and dizziness.  Patient was recently hospitalized in [facility] because of CHF exacerbation. He stayed in hospital for 2 days and was discharged home on Sunday. His Lasix dosage was increased to 80 mg daily, he is also on spironolactone. He reports that after he went home, his health condition has not come back to his baseline. He still has generalized weakness. He started having new symptoms, including dizziness and lightheadedness. He does not have cough, chest pain, shortness of breath.   Patient denies fever, chills, abdominal pain, diarrhea, constipation, dysuria, urgency, frequency, hematuria, skin rashes or leg swelling. No unilateral weakness, numbness or tingling sensations. No vision change or hearing loss.  In ED, patient was found to have very dry mucus and membrane, INR 2.55, BNP 60.5, trop 0.05, mild leukocytosis with WBC 10.8, tachycardia, temperature normal. Patient is admitted to inpatient for further evaluation and treatment.  Review of Systems: As presented in the history of presenting illness, rest negative.  Where does patient live?  At home Can patient participate in ADLs? barely  Allergy:  Allergies  Allergen Reactions  . Ivp Dye [Iodinated Diagnostic Agents] Anaphylaxis    Past Medical History  Diagnosis Date  . Kidney stones   . Prostate cancer   . MI (myocardial infarction)   . Coronary artery disease   . Hyperlipidemia   . Hypertension    . Automatic implantable cardioverter-defibrillator in situ   . Hypothyroidism   . Pacemaker   . GERD (gastroesophageal reflux disease)   . Hard of hearing   . CHF (congestive heart failure)   . Dysrhythmia     atrial fib  . Cardiomegaly     Past Surgical History  Procedure Laterality Date  . Prostatectomy    . Pacemaker insertion    . Cardiac defibrillator placement    . Hernia repair    . Cystoscopy with litholapaxy N/A 01/24/2013    Procedure: CYSTOSCOPY WITH LITHOLAPAXY;  Surgeon: Ardis Hughs, MD;  Location: [facility];  Service: Urology;  Laterality: N/A;  . Holmium laser application N/A 1/95/0932    Procedure: HOLMIUM LASER APPLICATION;  Surgeon: Ardis Hughs, MD;  Location: [facility];  Service: Urology;  Laterality: N/A;    Social History:  reports that he has never smoked. He has never used smokeless tobacco. He reports that he does not drink alcohol or use illicit drugs.  Family History:  Family History  Problem Relation Age of Onset  . Heart failure Mother   . Cancer Father   . Heart failure Sister      Prior to Admission medications   Medication Sig Start Date End Date Taking? Authorizing Provider  aspirin EC 81 MG tablet Take 81 mg by mouth 3 (three) times a week. Mon, Wed, and Sunday.   Yes Historical Provider, MD  atorvastatin (LIPITOR) 10 MG tablet Take 10 mg by mouth every evening.    Yes Historical Provider, MD  digoxin (LANOXIN) 0.125 MG tablet Take 0.125 mg by mouth daily.   Yes Historical Provider, MD  furosemide (LASIX) 80 MG tablet Take 80  mg by mouth daily.   Yes Historical Provider, MD  levothyroxine (SYNTHROID, LEVOTHROID) 75 MCG tablet Take 75 mcg by mouth daily before breakfast.   Yes Historical Provider, MD  losartan (COZAAR) 50 MG tablet Take 50 mg by mouth daily.   Yes Historical Provider, MD  meclizine (ANTIVERT) 25 MG tablet Take 25 mg by mouth 2 (two) times daily.   Yes Historical Provider, MD  meloxicam (MOBIC) 7.5 MG tablet Take 7.5  mg by mouth daily.   Yes Historical Provider, MD  metFORMIN (GLUCOPHAGE) 1000 MG tablet Take 1,000 mg by mouth 2 (two) times daily.   Yes Historical Provider, MD  metoprolol succinate (TOPROL-XL) 50 MG 24 hr tablet Take 50 mg by mouth daily. Take with or immediately following a meal.   Yes Historical Provider, MD  omeprazole (PRILOSEC) 40 MG capsule Take 40 mg by mouth daily.   Yes Historical Provider, MD  spironolactone (ALDACTONE) 50 MG tablet Take 50 mg by mouth daily.   Yes Historical Provider, MD  warfarin (COUMADIN) 2 MG tablet Take 2 mg by mouth every other day.   Yes Historical Provider, MD  warfarin (COUMADIN) 2.5 MG tablet Take 2.5 mg by mouth every other day.   Yes Historical Provider, MD  docusate sodium (COLACE) 50 MG capsule Take 2 capsules (100 mg total) by mouth 2 (two) times daily as needed for constipation. Patient not taking: Reported on 06/12/2014 01/24/13   Ardis Hughs, MD  enoxaparin (LOVENOX) 100 MG/ML injection Inject 1 mL (100 mg total) into the skin daily. Resume in 48 hours if bleeding in catheter has cleared. Patient not taking: Reported on 06/12/2014 01/24/13   Ardis Hughs, MD  oxyCODONE (ROXICODONE) 5 MG immediate release tablet Take 1 tablet (5 mg total) by mouth every 4 (four) hours as needed for pain. Patient not taking: Reported on 06/12/2014 01/24/13   Ardis Hughs, MD    Physical Exam: Filed Vitals:   06/12/14 1741 06/12/14 1932 06/12/14 2155 06/12/14 2317  BP: 122/66 108/60 109/65 105/62  Pulse: 101 94 90 90  Temp: 97.5 F (36.4 C)     TempSrc: Oral     Resp: 20 18 20 20   SpO2: 99% 98% 95% 97%   General: Not in acute distress. Very dry mucosa and the membrane. HEENT:       Eyes: PERRL, EOMI, no scleral icterus       ENT: No discharge from the ears and nose, no pharynx injection, no tonsillar enlargement.        Neck: No JVD, no bruit, no mass felt. Cardiac: S1/S2, RRR, No murmurs, No gallops or rubs Pulm: Good air movement  bilaterally. Clear to auscultation bilaterally. No rales, wheezing, rhonchi or rubs. Abd: Soft, nondistended, nontender, no rebound pain, no organomegaly, BS present Ext: No edema bilaterally. 2+DP/PT pulse bilaterally Musculoskeletal: No joint deformities, erythema, or stiffness, ROM full Skin: No rashes.  Neuro: Alert and oriented X3, cranial nerves II-XII grossly intact, muscle strength 5/5 in all extremeties, sensation to light touch intact. Brachial reflex 2+ bilaterally. Knee reflex 1+ bilaterally. Negative Babinski's sign. Normal finger to nose test. Psych: Patient is not psychotic, no suicidal or hemocidal ideation.  Labs on Admission:  Basic Metabolic Panel:  Recent Labs Lab 06/12/14 1750  NA 139  K 4.7  CL 99  CO2 27  GLUCOSE 130*  BUN 25*  CREATININE 1.10  CALCIUM 9.8   Liver Function Tests: No results for input(s): AST, ALT, ALKPHOS, BILITOT, PROT, ALBUMIN in  the last 168 hours. No results for input(s): LIPASE, AMYLASE in the last 168 hours. No results for input(s): AMMONIA in the last 168 hours. CBC:  Recent Labs Lab 06/12/14 1750  WBC 10.8*  HGB 14.4  HCT 43.6  MCV 90.8  PLT 436*   Cardiac Enzymes:  Recent Labs Lab 06/12/14 1755 06/12/14 2102  TROPONINI 0.05* 0.06*    BNP (last 3 results)  Recent Labs  06/12/14 1750  BNP 60.5    ProBNP (last 3 results) No results for input(s): PROBNP in the last 8760 hours.  CBG: No results for input(s): GLUCAP in the last 168 hours.  Radiological Exams on Admission: Dg Chest 2 View  06/12/2014   CLINICAL DATA:  dizziness x 6 weeks and is getting worse. Chest pain. Initial encounter.  EXAM: CHEST  2 VIEW  COMPARISON:  [facility] 06/05/14 and earlier.  FINDINGS: The heart size and mediastinal contours are stable. Left chest AICD is stable. Both lungs are clear. The visualized skeletal structures are stable.  IMPRESSION: No active cardiopulmonary disease.   Electronically Signed   By: Genevie Ann M.D.   On:  06/12/2014 23:09    EKG: Independently reviewed. Paced rhythm  Assessment/Plan Principal Problem:   Elevated troponin Active Problems:   CAD (coronary artery disease)   AICD (automatic cardioverter/defibrillator) present   A-fib   Prostate cancer   Hypothyroidism   Essential hypertension   HLD (hyperlipidemia)   GERD (gastroesophageal reflux disease)  Dizziness and lightheadedness: It is most likely due to orthostatic status. Patient is on high-dose of Lasix which has been recently increased to 80 mg daily. Clinically patient is very dry on admission, with very dry mucous and membrane, and no any leg edema. Other differential diagnosis including ACS given slightly elevated troponin, arrhythmia given atrial fibrillation. Neuro examination is nonfocal, lesslikely to have TIA/stroke. Pulmonary embolism is very unlikely given therapeutic INR on Coumadin.  - Admit to telemetry bed -Check orthostatic status -Hold Lasix and spironolactone  -IV fluid: Normal saline 500 bolus, followed by 50 mL per hour -Repeat EKG in morning -Check digoxin level  -check 2-D echo  Atrial Fibrillation: CHA2DS2-VASc Score 5, need oral anticoagulation. Patient is on coumadin at home. INR is 2.55 on admission. Patient was slightly tachycardic on admission, with heart rate 100-110 -continue coumadin and Coumadin -check digoxin level -Continue metoprolol -on AICD  Mildly elevated troponin: 0.05-->0.06, likely due to demanding ischemia due to tachycardia. He does not have chest pain. -Troponin 3  -aspirin, Lipitor, nitroglycerin when necessary, morphine when necessary, metoprolol -Patient is on coumadin. INR is 2.55 on admission.  CHF: No 2-D echo on Epic record. Per patient's wife, his recent EF was 35% in [facility]. Patient is clinically dehydrated -Hold diuretics as above -continue aspirin, Toprol  Hypothyroidism, no TSH on record. Patient is on Synthroid at home. -Continue Synthroid -Check  TSH next  Hyperlipidemia: Patient is on Lipitor at home. No LDL on record. -Continue Lipitor -Check FLP  History of prostate cancer 5 years ago: Post status of surgery, did not have radiation or chemotherapy. -Check PSA  Hypertension: -Continue metoprolol and Cozaar  DVT ppx: on coumadin with INR=2.55 Code Status: DNR Family Communication: Yes, patient's  wife     at bed side Disposition Plan: Admit to inpatient   Date of Service 06/13/2014    Ivor Costa [name] Pager [phone]  If 7PM-7AM, please contact night-coverage [email or web address] Password [facility] 06/13/2014, 12:26 AM

## 2014-06-12 NOTE — ED Provider Notes (Signed)
CSN: [id number]     Arrival date & time 06/12/14  1733 History   First MD Initiated Contact with Patient 06/12/14 1806     Chief Complaint  Patient presents with  . Dizziness     (Consider location/radiation/quality/duration/timing/severity/associated sxs/prior Treatment) Patient is a 74 y.o. male presenting with dizziness.  Dizziness   Past Medical History  Diagnosis Date  . Kidney stones   . Prostate cancer   . MI (myocardial infarction)   . Coronary artery disease   . Hyperlipidemia   . Hypertension   . Automatic implantable cardioverter-defibrillator in situ   . Hypothyroidism   . Pacemaker   . GERD (gastroesophageal reflux disease)   . Hard of hearing   . CHF (congestive heart failure)   . Dysrhythmia     atrial fib  . Cardiomegaly    Past Surgical History  Procedure Laterality Date  . Prostatectomy    . Pacemaker insertion    . Cardiac defibrillator placement    . Hernia repair    . Cystoscopy with litholapaxy N/A 01/24/2013    Procedure: CYSTOSCOPY WITH LITHOLAPAXY;  Surgeon: Ardis Hughs, MD;  Location: [facility];  Service: Urology;  Laterality: N/A;  . Holmium laser application N/A 8/75/6433    Procedure: HOLMIUM LASER APPLICATION;  Surgeon: Ardis Hughs, MD;  Location: [facility];  Service: Urology;  Laterality: N/A;   Family History  Problem Relation Age of Onset  . Heart failure Mother   . Cancer Father   . Heart failure Sister    History  Substance Use Topics  . Smoking status: Never Smoker   . Smokeless tobacco: Never Used  . Alcohol Use: No    Review of Systems  Neurological: Positive for dizziness.  All other systems reviewed and are negative.     Allergies  Ivp dye  Home Medications   Prior to Admission medications   Medication Sig Start Date End Date Taking? Authorizing Provider  aspirin EC 81 MG tablet Take 81 mg by mouth 3 (three) times a week. Mon, Wed, and Sunday.   Yes Historical Provider, MD  atorvastatin (LIPITOR) 10  MG tablet Take 10 mg by mouth every evening.    Yes Historical Provider, MD  digoxin (LANOXIN) 0.125 MG tablet Take 0.125 mg by mouth daily.   Yes Historical Provider, MD  furosemide (LASIX) 80 MG tablet Take 80 mg by mouth daily.   Yes Historical Provider, MD  levothyroxine (SYNTHROID, LEVOTHROID) 75 MCG tablet Take 75 mcg by mouth daily before breakfast.   Yes Historical Provider, MD  losartan (COZAAR) 50 MG tablet Take 50 mg by mouth daily.   Yes Historical Provider, MD  meclizine (ANTIVERT) 25 MG tablet Take 25 mg by mouth 2 (two) times daily.   Yes Historical Provider, MD  meloxicam (MOBIC) 7.5 MG tablet Take 7.5 mg by mouth daily.   Yes Historical Provider, MD  metFORMIN (GLUCOPHAGE) 1000 MG tablet Take 1,000 mg by mouth 2 (two) times daily.   Yes Historical Provider, MD  metoprolol succinate (TOPROL-XL) 50 MG 24 hr tablet Take 50 mg by mouth daily. Take with or immediately following a meal.   Yes Historical Provider, MD  omeprazole (PRILOSEC) 40 MG capsule Take 40 mg by mouth daily.   Yes Historical Provider, MD  spironolactone (ALDACTONE) 50 MG tablet Take 50 mg by mouth daily.   Yes Historical Provider, MD  warfarin (COUMADIN) 2 MG tablet Take 2 mg by mouth every other day.   Yes Historical Provider,  MD  warfarin (COUMADIN) 2.5 MG tablet Take 2.5 mg by mouth every other day.   Yes Historical Provider, MD  docusate sodium (COLACE) 50 MG capsule Take 2 capsules (100 mg total) by mouth 2 (two) times daily as needed for constipation. Patient not taking: Reported on 06/12/2014 01/24/13   Ardis Hughs, MD  enoxaparin (LOVENOX) 100 MG/ML injection Inject 1 mL (100 mg total) into the skin daily. Resume in 48 hours if bleeding in catheter has cleared. Patient not taking: Reported on 06/12/2014 01/24/13   Ardis Hughs, MD  oxyCODONE (ROXICODONE) 5 MG immediate release tablet Take 1 tablet (5 mg total) by mouth every 4 (four) hours as needed for pain. Patient not taking: Reported on 06/12/2014  01/24/13   Ardis Hughs, MD   BP 122/66 mmHg  Pulse 101  Temp(Src) 97.5 F (36.4 C) (Oral)  Resp 20  SpO2 99% Physical Exam  Constitutional: He is oriented to person, place, and time. He appears well-developed and well-nourished.  Non-toxic appearance. No distress.  HENT:  Head: Normocephalic and atraumatic.  Eyes: Conjunctivae, EOM and lids are normal. Pupils are equal, round, and reactive to light.  Neck: Normal range of motion. Neck supple. No tracheal deviation present. No thyroid mass present.  Cardiovascular: Normal heart sounds.  An irregularly irregular rhythm present. Tachycardia present.  Exam reveals no gallop.   No murmur heard. Pulmonary/Chest: Effort normal and breath sounds normal. No stridor. No respiratory distress. He has no decreased breath sounds. He has no wheezes. He has no rhonchi. He has no rales.  Abdominal: Soft. Normal appearance and bowel sounds are normal. He exhibits no distension. There is no tenderness. There is no rebound and no CVA tenderness.  Musculoskeletal: Normal range of motion. He exhibits no edema or tenderness.  Neurological: He is alert and oriented to person, place, and time. He has normal strength. No cranial nerve deficit or sensory deficit. GCS eye subscore is 4. GCS verbal subscore is 5. GCS motor subscore is 6.  Skin: Skin is warm and dry. No abrasion and no rash noted.  Psychiatric: He has a normal mood and affect. His speech is normal and behavior is normal.  Nursing note and vitals reviewed.   ED Course  Procedures (including critical care time) Labs Review Labs Reviewed  CBC  BASIC METABOLIC PANEL  [place]ear, ED    Imaging Review No results found.   EKG Interpretation   Date/Time:  Friday June 12 2014 17:47:27 EST Ventricular Rate:  135 PR Interval:    QRS Duration: 120 QT Interval:  332 QTC Calculation: 498 R Axis:   -75 Text  Interpretation:  Atrial fibrillation Ventricular premature complex  Left anterior fascicular block Anteroseptal infarct, old Nonspecific T  abnormalities, lateral leads Baseline wander in lead(s) V2 rate increased  from prior Confirmed by Melainie Krinsky  MD, Adilene Areola ([id number]) on 06/12/2014 6:06:58 PM      MDM   Final diagnoses:  None    Patient with elevated troponin was repeated 3 hours after the first or and remains elevated. Patient's EKG does not show any signs of ischemia. His dizziness appears be somewhat vertiginous and he has had episodes of rapid ventricular rate response which could explain his elevated troponin. He will need to be admitted for serial markers. His INR is therapeutic    Leota Jacobsen, MD 06/12/14 2231

## 2014-06-12 NOTE — ED Notes (Signed)
Patient reports that he has had dizziness x 6 weeks and is getting worse. Patient was discharged from [facility] with diagnosis of CHF. Patient states he has been more tired  In the past few days.

## 2014-06-13 DIAGNOSIS — R531 Weakness: Secondary | ICD-10-CM

## 2014-06-13 DIAGNOSIS — E039 Hypothyroidism, unspecified: Secondary | ICD-10-CM

## 2014-06-13 LAB — TROPONIN I
TROPONIN I: 0.06 ng/mL — AB (ref <0.031)
Troponin I: 0.06 ng/mL — ABNORMAL HIGH (ref <0.031)
Troponin I: 0.06 ng/mL — ABNORMAL HIGH (ref <0.031)

## 2014-06-13 LAB — LIPID PANEL
Cholesterol: 131 mg/dL (ref 0–200)
HDL: 24 mg/dL — AB (ref >39)
LDL Cholesterol: 73 mg/dL (ref 0–99)
TRIGLYCERIDES: 168 mg/dL — AB (ref <150)
Total CHOL/HDL Ratio: 5.5 RATIO
VLDL: 34 mg/dL (ref 0–40)

## 2014-06-13 LAB — COMPREHENSIVE METABOLIC PANEL
ALT: 37 U/L (ref 0–53)
ANION GAP: 8 (ref 5–15)
AST: 27 U/L (ref 0–37)
Albumin: 3.1 g/dL — ABNORMAL LOW (ref 3.5–5.2)
Alkaline Phosphatase: 78 U/L (ref 39–117)
BUN: 27 mg/dL — ABNORMAL HIGH (ref 6–23)
CO2: 31 mmol/L (ref 19–32)
Calcium: 8.8 mg/dL (ref 8.4–10.5)
Chloride: 102 mmol/L (ref 96–112)
Creatinine, Ser: 0.99 mg/dL (ref 0.50–1.35)
GFR calc Af Amer: >90 mL/min (ref >90)
GFR calc non Af Amer: 79 mL/min — ABNORMAL LOW (ref >90)
Glucose, Bld: 117 mg/dL — ABNORMAL HIGH (ref 70–99)
POTASSIUM: 4.6 mmol/L (ref 3.5–5.1)
Sodium: 141 mmol/L (ref 135–145)
TOTAL PROTEIN: 6.6 g/dL (ref 6.0–8.3)
Total Bilirubin: 1.6 mg/dL — ABNORMAL HIGH (ref 0.3–1.2)

## 2014-06-13 LAB — GLUCOSE, CAPILLARY
GLUCOSE-CAPILLARY: 182 mg/dL — AB (ref 70–99)
Glucose-Capillary: 110 mg/dL — ABNORMAL HIGH (ref 70–99)
Glucose-Capillary: 127 mg/dL — ABNORMAL HIGH (ref 70–99)
Glucose-Capillary: 131 mg/dL — ABNORMAL HIGH (ref 70–99)

## 2014-06-13 LAB — CBC
HCT: 37.8 % — ABNORMAL LOW (ref 39.0–52.0)
Hemoglobin: 12.5 g/dL — ABNORMAL LOW (ref 13.0–17.0)
MCH: 30 pg (ref 26.0–34.0)
MCHC: 33.1 g/dL (ref 30.0–36.0)
MCV: 90.9 fL (ref 78.0–100.0)
Platelets: 369 10*3/uL (ref 150–400)
RBC: 4.16 MIL/uL — AB (ref 4.22–5.81)
RDW: 15.3 % (ref 11.5–15.5)
WBC: 10.6 10*3/uL — AB (ref 4.0–10.5)

## 2014-06-13 LAB — PROTIME-INR
INR: 2.88 — ABNORMAL HIGH (ref 0.00–1.49)
Prothrombin Time: 30.4 seconds — ABNORMAL HIGH (ref 11.6–15.2)

## 2014-06-13 LAB — TSH: TSH: 3.42 u[IU]/mL (ref 0.350–4.500)

## 2014-06-13 MED ORDER — LOSARTAN POTASSIUM 50 MG PO TABS
50.0000 mg | ORAL_TABLET | Freq: Every day | ORAL | Status: DC
  Administered 2014-06-13 – 2014-06-15 (×3): 50 mg via ORAL
  Filled 2014-06-13 (×3): qty 1

## 2014-06-13 MED ORDER — MECLIZINE HCL 25 MG PO TABS
25.0000 mg | ORAL_TABLET | Freq: Two times a day (BID) | ORAL | Status: DC
  Administered 2014-06-13 – 2014-06-15 (×6): 25 mg via ORAL
  Filled 2014-06-13 (×7): qty 1

## 2014-06-13 MED ORDER — ATORVASTATIN CALCIUM 10 MG PO TABS
10.0000 mg | ORAL_TABLET | Freq: Every evening | ORAL | Status: DC
  Administered 2014-06-13 – 2014-06-14 (×2): 10 mg via ORAL
  Filled 2014-06-13 (×2): qty 1

## 2014-06-13 MED ORDER — INSULIN ASPART 100 UNIT/ML ~~LOC~~ SOLN
0.0000 [IU] | Freq: Three times a day (TID) | SUBCUTANEOUS | Status: DC
  Administered 2014-06-13: 1 [IU] via SUBCUTANEOUS
  Administered 2014-06-13: 2 [IU] via SUBCUTANEOUS
  Administered 2014-06-14 – 2014-06-15 (×4): 1 [IU] via SUBCUTANEOUS

## 2014-06-13 MED ORDER — PANTOPRAZOLE SODIUM 40 MG PO TBEC
40.0000 mg | DELAYED_RELEASE_TABLET | Freq: Every day | ORAL | Status: DC
Start: 2014-06-13 — End: 2014-06-15
  Administered 2014-06-13 – 2014-06-15 (×3): 40 mg via ORAL
  Filled 2014-06-13 (×3): qty 1

## 2014-06-13 MED ORDER — SODIUM CHLORIDE 0.9 % IV BOLUS (SEPSIS): 500.0000 mL | Freq: Once | INTRAVENOUS | Status: DC

## 2014-06-13 MED ORDER — WARFARIN - PHARMACIST DOSING INPATIENT: Freq: Every day | Status: DC

## 2014-06-13 MED ORDER — ASPIRIN EC 81 MG PO TBEC
81.0000 mg | DELAYED_RELEASE_TABLET | ORAL | Status: DC
  Administered 2014-06-15: 81 mg via ORAL
  Filled 2014-06-13: qty 1

## 2014-06-13 MED ORDER — SODIUM CHLORIDE 0.9 % IV SOLN
INTRAVENOUS | Status: DC
  Administered 2014-06-14 – 2014-06-15 (×3): via INTRAVENOUS

## 2014-06-13 MED ORDER — SODIUM CHLORIDE 0.9 % IV BOLUS (SEPSIS)
500.0000 mL | Freq: Once | INTRAVENOUS | Status: AC
  Administered 2014-06-13: 500 mL via INTRAVENOUS

## 2014-06-13 MED ORDER — MORPHINE SULFATE 2 MG/ML IJ SOLN
2.0000 mg | INTRAMUSCULAR | Status: DC | PRN
Start: 2014-06-13 — End: 2014-06-15
  Administered 2014-06-13 – 2014-06-15 (×2): 2 mg via INTRAVENOUS
  Filled 2014-06-13 (×4): qty 1

## 2014-06-13 MED ORDER — MELOXICAM 7.5 MG PO TABS
7.5000 mg | ORAL_TABLET | Freq: Every day | ORAL | Status: DC
  Administered 2014-06-13 – 2014-06-15 (×3): 7.5 mg via ORAL
  Filled 2014-06-13 (×4): qty 1

## 2014-06-13 MED ORDER — WARFARIN 0.5 MG HALF TABLET
0.5000 mg | ORAL_TABLET | Freq: Once | ORAL | Status: AC
  Administered 2014-06-13: 0.5 mg via ORAL
  Filled 2014-06-13: qty 1

## 2014-06-13 MED ORDER — SODIUM CHLORIDE 0.9 % IJ SOLN
3.0000 mL | Freq: Two times a day (BID) | INTRAMUSCULAR | Status: DC
  Administered 2014-06-13 (×2): 3 mL via INTRAVENOUS

## 2014-06-13 MED ORDER — LEVOTHYROXINE SODIUM 25 MCG PO TABS
75.0000 ug | ORAL_TABLET | Freq: Every day | ORAL | Status: DC
  Administered 2014-06-13 – 2014-06-15 (×3): 75 ug via ORAL
  Filled 2014-06-13 (×3): qty 3

## 2014-06-13 MED ORDER — DIGOXIN 125 MCG PO TABS
0.1250 mg | ORAL_TABLET | Freq: Every day | ORAL | Status: DC
  Administered 2014-06-13 – 2014-06-15 (×3): 0.125 mg via ORAL
  Filled 2014-06-13 (×3): qty 1

## 2014-06-13 MED ORDER — METOPROLOL SUCCINATE ER 50 MG PO TB24
50.0000 mg | ORAL_TABLET | Freq: Every day | ORAL | Status: DC
  Administered 2014-06-13 – 2014-06-15 (×3): 50 mg via ORAL
  Filled 2014-06-13 (×3): qty 1

## 2014-06-13 MED ORDER — NITROGLYCERIN 0.4 MG SL SUBL: 0.4000 mg | SUBLINGUAL_TABLET | SUBLINGUAL | Status: DC | PRN

## 2014-06-13 NOTE — Progress Notes (Signed)
ANTICOAGULATION CONSULT NOTE - Initial Consult  Pharmacy Consult for Warfarin Indication: atrial fibrillation  Allergies  Allergen Reactions  . Ivp Dye [Iodinated Diagnostic Agents] Anaphylaxis    Patient Measurements: Height : Weight :  Vital Signs: Temp: 97.8 F (36.6 C) (02/13 0544) Temp Source: Oral (02/13 0544) BP: 105/56 mmHg (02/13 0544) Pulse Rate: 77 (02/13 0544)  Labs:  Recent Labs  06/12/14 1750 06/12/14 1755 06/12/14 2102 06/13/14 0004 06/13/14 0138  HGB 14.4  --   --   --  12.5*  HCT 43.6  --   --   --  37.8*  PLT 436*  --   --   --  369  LABPROT  --  27.6*  --   --  30.4*  INR  --  2.55*  --   --  2.88*  CREATININE 1.10  --   --   --  0.99  TROPONINI  --  0.05* 0.06* 0.06*  --     Estimated Creatinine Clearance: 57.9 mL/min (by C-G formula based on Cr of 0.99).   Medical History: Past Medical History  Diagnosis Date  . Kidney stones   . Prostate cancer   . MI (myocardial infarction)   . Coronary artery disease   . Hyperlipidemia   . Hypertension   . Automatic implantable cardioverter-defibrillator in situ   . Hypothyroidism   . Pacemaker   . GERD (gastroesophageal reflux disease)   . Hard of hearing   . CHF (congestive heart failure)   . Dysrhythmia     atrial fib  . Cardiomegaly     Medications:  Scheduled:  . [START ON 06/15/2014] aspirin EC  81 mg Oral Once per day on Mon Wed Fri  . atorvastatin  10 mg Oral QPM  . digoxin  0.125 mg Oral Daily  . insulin aspart  0-9 Units Subcutaneous TID WC  . levothyroxine  75 mcg Oral QAC breakfast  . losartan  50 mg Oral Daily  . meclizine  25 mg Oral BID  . meloxicam  7.5 mg Oral Daily  . metoprolol succinate  50 mg Oral Daily  . pantoprazole  40 mg Oral Daily  . sodium chloride  3 mL Intravenous Q12H  . Warfarin - Pharmacist Dosing Inpatient   Does not apply q1800   Assessment: 74 yr male to ED with c/o weakness and dizziness. On Warfarin PTA for AFib. PMH includes AICD placement, HTN,  CHF, h/o prostate cancer, CAD.   PTA Warfarin regimen is 2.5mg  alternating with 2mg  every other day.  Last dose warfarin taken on 2/12 (however pt cannot recall which strength he took on 2/12)  INR upon admission (2/12) = 2.55 (in therapeutic range)  Troponins slightly elevated, likely demand ischemia. Dig level 1.3, holding Lasix spironolactone.  No diet ordered  INR this am 2.88  Goal of Therapy:  INR 2-3   Plan:   Warfarin 0.5mg  tonight at 1800 (hesitate to hold Warfarin completely with decreased po intake)  Daily PT/INR  Minda Ditto PharmD Pager [phone] 06/13/2014, 7:41 AM

## 2014-06-13 NOTE — Progress Notes (Signed)
  Echocardiogram 2D Echocardiogram has been performed.  Mitchell Murray 06/13/2014, 12:36 PM

## 2014-06-13 NOTE — Progress Notes (Signed)
[name] PROGRESS NOTE  Mitchell Murray EYC:[id number] DOB: 1940/06/03 DOA: 06/12/2014 PCP: Gilford Rile, MD  Assessment/Plan: #1 generalized weakness/dizziness or lightheadedness Likely secondary to orthostasis. Patient's blood pressure is noted to be borderline/hypotensive. Patient was recently discharged from [facility] with high dose Lasix. Patient is clinically dry. No signs or symptoms of volume overload. Patient was slightly elevated troponin likely secondary to A. fib. Patient is asymptomatic. Continue to hold diuretics of Lasix and spironolactone. WIll give a bolus of normal saline 500 mL per hour and place on IV fluids. 2-D echo does have a EF of 35-40% with moderate diffuse hypokinesis. We do not have old 2-D echo now system and as such will try to obtain records from [facility]. Follow.  #2 elevated troponin Patient's troponins are mildly elevated likely due to demand ischemia secondary to tachycardia. Patient is currently asymptomatic. Continue aspirin, Lipitor, metoprolol. Continue Coumadin. INR is therapeutic. Follow.  #3 CHF Patient with history of CHF is. Patient states was recently at [facility] for CHF exacerbation. No 2-D echo on file on Epic. Per patient's wife his recent EF was 35% at [facility]. Patient is clinically dehydrated. Repeat 2-D echo with a EF of 35-40%. Will try to obtain records from [place]. Continue to hold diuretics. Continue aspirin and Toprol, Cozaar.  #4 hypothyroidism TSH at 3.420. Continue home dose Synthroid.  #5 hyperlipidemia LDL of 73. Continue Lipitor.  #6 history of prostate cancer 5 years ago Status post surgery. Patient did not have radiation or chemotherapy. Outpatient follow-up.  #7 hypertension Continue metoprolol and Cozaar.  #8 prophylaxis Coumadin for DVT prophylaxis.  Code Status: DO NOT RESUSCITATE Family Communication: Updated patient and wife at bedside. Disposition Plan:  Remain inpatient.   Consultants:  None  Procedures:  Chest x-ray 06/12/2014  2-D echo 06/13/2014  Antibiotics:  None  HPI/Subjective: Patient denies any shortness of breath. Patient denies any chest pain. Patient complaining of generalized weakness.  Objective: Filed Vitals:   06/13/14 0544  BP: 105/56  Pulse: 77  Temp: 97.8 F (36.6 C)  Resp: 18    Intake/Output Summary (Last 24 hours) at 06/13/14 1453 Last data filed at 06/13/14 0900  Gross per 24 hour  Intake    240 ml  Output    500 ml  Net   -260 ml   Filed Weights   06/13/14 0020 06/13/14 0616  Weight: 60.918 kg (134 lb 4.8 oz) 61.598 kg (135 lb 12.8 oz)    Exam:   General:  NAD  Cardiovascular: RRR  Respiratory: CTAB  Abdomen: Soft/ND/NT/ND/+BS  Musculoskeletal: No clubbing cyanosis or edema.  Data Reviewed: Basic Metabolic Panel:  Recent Labs Lab 06/12/14 1750 06/13/14 0138  NA 139 141  K 4.7 4.6  CL 99 102  CO2 27 31  GLUCOSE 130* 117*  BUN 25* 27*  CREATININE 1.10 0.99  CALCIUM 9.8 8.8   Liver Function Tests:  Recent Labs Lab 06/13/14 0138  AST 27  ALT 37  ALKPHOS 78  BILITOT 1.6*  PROT 6.6  ALBUMIN 3.1*   No results for input(s): LIPASE, AMYLASE in the last 168 hours. No results for input(s): AMMONIA in the last 168 hours. CBC:  Recent Labs Lab 06/12/14 1750 06/13/14 0138  WBC 10.8* 10.6*  HGB 14.4 12.5*  HCT 43.6 37.8*  MCV 90.8 90.9  PLT 436* 369   Cardiac Enzymes:  Recent Labs Lab 06/12/14 1755 06/12/14 2102 06/13/14 0004 06/13/14 0659 06/13/14 1240  TROPONINI 0.05* 0.06* 0.06* 0.06* 0.06*  BNP (last 3 results)  Recent Labs  06/12/14 1750  BNP 60.5    ProBNP (last 3 results) No results for input(s): PROBNP in the last 8760 hours.  CBG:  Recent Labs Lab 06/13/14 0750 06/13/14 1249  GLUCAP 110* 182*    No results found for this or any previous visit (from the past 240 hour(s)).   Studies: Dg Chest 2 View  06/12/2014    CLINICAL DATA:  dizziness x 6 weeks and is getting worse. Chest pain. Initial encounter.  EXAM: CHEST  2 VIEW  COMPARISON:  [facility] 06/05/14 and earlier.  FINDINGS: The heart size and mediastinal contours are stable. Left chest AICD is stable. Both lungs are clear. The visualized skeletal structures are stable.  IMPRESSION: No active cardiopulmonary disease.   Electronically Signed   By: Genevie Ann M.D.   On: 06/12/2014 23:09    Scheduled Meds: . [START ON 06/15/2014] aspirin EC  81 mg Oral Once per day on Mon Wed Fri  . atorvastatin  10 mg Oral QPM  . digoxin  0.125 mg Oral Daily  . insulin aspart  0-9 Units Subcutaneous TID WC  . levothyroxine  75 mcg Oral QAC breakfast  . losartan  50 mg Oral Daily  . meclizine  25 mg Oral BID  . meloxicam  7.5 mg Oral Daily  . metoprolol succinate  50 mg Oral Daily  . pantoprazole  40 mg Oral Daily  . sodium chloride  3 mL Intravenous Q12H  . warfarin  0.5 mg Oral ONCE-1800  . Warfarin - Pharmacist Dosing Inpatient   Does not apply q1800   Continuous Infusions:   Principal Problem:   Elevated troponin Active Problems:   CAD (coronary artery disease)   AICD (automatic cardioverter/defibrillator) present   A-fib   Prostate cancer   Hypothyroidism   Essential hypertension   HLD (hyperlipidemia)   GERD (gastroesophageal reflux disease)   Weakness generalized    Time spent: 8 mins    [facility] MD [name] Pager [phone]. If 7PM-7AM, please contact night-coverage at [email or web address], password [facility] 06/13/2014, 2:53 PM  LOS: 1 day

## 2014-06-13 NOTE — Progress Notes (Signed)
ANTICOAGULATION CONSULT NOTE - Initial Consult  Pharmacy Consult for Warfarin Indication: atrial fibrillation  Allergies  Allergen Reactions  . Ivp Dye [Iodinated Diagnostic Agents] Anaphylaxis    Patient Measurements: Height : Weight :  Vital Signs: Temp: 97.5 F (36.4 C) (02/12 1741) Temp Source: Oral (02/12 1741) BP: 105/62 mmHg (02/12 2317) Pulse Rate: 90 (02/12 2317)  Labs:  Recent Labs  06/12/14 1750 06/12/14 1755 06/12/14 2102  HGB 14.4  --   --   HCT 43.6  --   --   PLT 436*  --   --   LABPROT  --  27.6*  --   INR  --  2.55*  --   CREATININE 1.10  --   --   TROPONINI  --  0.05* 0.06*    CrCl cannot be calculated (Unknown ideal weight.).   Medical History: Past Medical History  Diagnosis Date  . Kidney stones   . Prostate cancer   . MI (myocardial infarction)   . Coronary artery disease   . Hyperlipidemia   . Hypertension   . Automatic implantable cardioverter-defibrillator in situ   . Hypothyroidism   . Pacemaker   . GERD (gastroesophageal reflux disease)   . Hard of hearing   . CHF (congestive heart failure)   . Dysrhythmia     atrial fib  . Cardiomegaly     Medications:  Scheduled:  . [START ON 06/15/2014] aspirin EC  81 mg Oral Once per day on Mon Wed Fri  . atorvastatin  10 mg Oral QPM  . digoxin  0.125 mg Oral Daily  . insulin aspart  0-9 Units Subcutaneous TID WC  . levothyroxine  75 mcg Oral QAC breakfast  . losartan  50 mg Oral Daily  . meclizine  25 mg Oral BID  . meloxicam  7.5 mg Oral Daily  . metoprolol succinate  50 mg Oral Daily  . pantoprazole  40 mg Oral Daily  . sodium chloride  500 mL Intravenous Once  . sodium chloride  3 mL Intravenous Q12H  . Warfarin - Pharmacist Dosing Inpatient   Does not apply q1800   Infusions:    [name] PMH includes AICD placement, HTN, CHF, h/o prostate cancer, CAD  Arrives in ED with c/o weakness and dizziness  PTA Warfarin regimen is  2.5mg  alternating with 2mg  every other day.  Last dose warfarin taken on 2/12 (however pt cannot recall which strength he took on 2/12)  INR upon admission (2/12) = 2.55 (therapeutic)  Goal of Therapy:  INR 2-3   Plan:   No further warfarin needed tonight, as INR therapeutic and patient took medicine on 2/12  Check daily PT/INR  F/U AM INR and dose accordingly on 2/13  [name], [name], PharmD 06/13/2014,12:16 AM

## 2014-06-14 DIAGNOSIS — I951 Orthostatic hypotension: Secondary | ICD-10-CM | POA: Insufficient documentation

## 2014-06-14 DIAGNOSIS — E119 Type 2 diabetes mellitus without complications: Secondary | ICD-10-CM

## 2014-06-14 LAB — BASIC METABOLIC PANEL
Anion gap: 4 — ABNORMAL LOW (ref 5–15)
BUN: 23 mg/dL (ref 6–23)
CO2: 30 mmol/L (ref 19–32)
Calcium: 8.8 mg/dL (ref 8.4–10.5)
Chloride: 106 mmol/L (ref 96–112)
Creatinine, Ser: 1.07 mg/dL (ref 0.50–1.35)
GFR calc non Af Amer: 67 mL/min — ABNORMAL LOW (ref >90)
GFR, EST AFRICAN AMERICAN: 77 mL/min — AB (ref >90)
Glucose, Bld: 143 mg/dL — ABNORMAL HIGH (ref 70–99)
Potassium: 5 mmol/L (ref 3.5–5.1)
SODIUM: 140 mmol/L (ref 135–145)

## 2014-06-14 LAB — GLUCOSE, CAPILLARY
GLUCOSE-CAPILLARY: 130 mg/dL — AB (ref 70–99)
GLUCOSE-CAPILLARY: 145 mg/dL — AB (ref 70–99)
Glucose-Capillary: 148 mg/dL — ABNORMAL HIGH (ref 70–99)
Glucose-Capillary: 153 mg/dL — ABNORMAL HIGH (ref 70–99)

## 2014-06-14 LAB — CBC
HCT: 36.3 % — ABNORMAL LOW (ref 39.0–52.0)
HEMOGLOBIN: 11.7 g/dL — AB (ref 13.0–17.0)
MCH: 29.5 pg (ref 26.0–34.0)
MCHC: 32.2 g/dL (ref 30.0–36.0)
MCV: 91.7 fL (ref 78.0–100.0)
PLATELETS: 333 10*3/uL (ref 150–400)
RBC: 3.96 MIL/uL — ABNORMAL LOW (ref 4.22–5.81)
RDW: 15 % (ref 11.5–15.5)
WBC: 8.3 10*3/uL (ref 4.0–10.5)

## 2014-06-14 LAB — PROTIME-INR
INR: 2.15 — ABNORMAL HIGH (ref 0.00–1.49)
PROTHROMBIN TIME: 24.2 s — AB (ref 11.6–15.2)

## 2014-06-14 MED ORDER — WARFARIN SODIUM 2.5 MG PO TABS
2.5000 mg | ORAL_TABLET | Freq: Once | ORAL | Status: AC
Start: 2014-06-14 — End: 2014-06-14
  Administered 2014-06-14: 2.5 mg via ORAL
  Filled 2014-06-14: qty 1

## 2014-06-14 MED ORDER — GLUCERNA SHAKE PO LIQD
237.0000 mL | Freq: Two times a day (BID) | ORAL | Status: DC
  Administered 2014-06-14 – 2014-06-15 (×2): 237 mL via ORAL
  Filled 2014-06-14 (×3): qty 237

## 2014-06-14 NOTE — Progress Notes (Signed)
ANTICOAGULATION CONSULT NOTE - Initial Consult  Pharmacy Consult for Warfarin Indication: atrial fibrillation  Allergies  Allergen Reactions  . Ivp Dye [Iodinated Diagnostic Agents] Anaphylaxis    Patient Measurements: Height : Weight :  Vital Signs: Temp: 97.5 F (36.4 C) (02/14 0500) Temp Source: Oral (02/14 0500) BP: 104/64 mmHg (02/14 0500) Pulse Rate: 76 (02/14 0500)  Labs:  Recent Labs  06/12/14 1750 06/12/14 1755  06/13/14 0004 06/13/14 0138 06/13/14 0659 06/13/14 1240 06/14/14 0521  HGB 14.4  --   --   --  12.5*  --   --  11.7*  HCT 43.6  --   --   --  37.8*  --   --  36.3*  PLT 436*  --   --   --  369  --   --  333  LABPROT  --  27.6*  --   --  30.4*  --   --  24.2*  INR  --  2.55*  --   --  2.88*  --   --  2.15*  CREATININE 1.10  --   --   --  0.99  --   --  1.07  TROPONINI  --  0.05*  < > 0.06*  --  0.06* 0.06*  --   < > = values in this interval not displayed.  Estimated Creatinine Clearance: 54.3 mL/min (by C-G formula based on Cr of 1.07).   Medical History: Past Medical History  Diagnosis Date  . Kidney stones   . Prostate cancer   . MI (myocardial infarction)   . Coronary artery disease   . Hyperlipidemia   . Hypertension   . Automatic implantable cardioverter-defibrillator in situ   . Hypothyroidism   . Pacemaker   . GERD (gastroesophageal reflux disease)   . Hard of hearing   . CHF (congestive heart failure)   . Dysrhythmia     atrial fib  . Cardiomegaly     Medications:  Scheduled:  . [START ON 06/15/2014] aspirin EC  81 mg Oral Once per day on Mon Wed Fri  . atorvastatin  10 mg Oral QPM  . digoxin  0.125 mg Oral Daily  . insulin aspart  0-9 Units Subcutaneous TID WC  . levothyroxine  75 mcg Oral QAC breakfast  . losartan  50 mg Oral Daily  . meclizine  25 mg Oral BID  . meloxicam  7.5 mg Oral Daily  . metoprolol succinate  50 mg Oral Daily  . pantoprazole  40 mg Oral Daily  . sodium chloride  500 mL Intravenous Once  .  sodium chloride  3 mL Intravenous Q12H  . Warfarin - Pharmacist Dosing Inpatient   Does not apply q1800   Assessment: 74 yr male to ED with c/o weakness and dizziness. On Warfarin PTA for AFib. PMH includes AICD placement, HTN, CHF, h/o prostate cancer, CAD.   PTA Warfarin regimen is 2.5mg  alternating with 2mg  every other day.  Last dose warfarin taken on 2/12 (however pt cannot recall which strength he took on 2/12)  INR upon admission (2/12) = 2.55 (in therapeutic range)  Troponins slightly elevated, likely demand ischemia. Dig level 1.3, holding Lasix spironolactone. 2D echo: EF 35-40%  INR this am = 2.15  Goal of Therapy:  INR 2-3   Plan:   Warfarin 2.5mg  today, give earlier than usual 1800 schedule  Daily PT/INR  Minda Ditto PharmD Pager [phone] 06/14/2014, 8:12 AM

## 2014-06-14 NOTE — Progress Notes (Signed)
[name] PROGRESS NOTE  [name] Mitchell Murray OBS:[id number] DOB: 01/01/1941 DOA: 06/12/2014 PCP: Gilford Rile, MD  Assessment/Plan: #1 generalized weakness/dizziness or lightheadedness/orthostasis Likely secondary to orthostasis. Patient's blood pressure is noted to be borderline/hypotensive. Clinical improvement. Patient was recently discharged from [facility] with high dose Lasix. Patient is clinically dry. No signs or symptoms of volume overload. Patient was slightly elevated troponin likely secondary to A. fib. Patient is asymptomatic. Continue to hold diuretics of Lasix and spironolactone. Continue gentle hydration. 2-D echo does have a EF of 35-40% with moderate diffuse hypokinesis. 2-D echo from outside hospital results with similar ejection fraction with no significant change. Follow.  #2 elevated troponin Patient's troponins are mildly elevated likely due to demand ischemia secondary to tachycardia. Patient is currently asymptomatic. Continue aspirin, Lipitor, metoprolol. Continue Coumadin. INR is therapeutic. Follow.  #3 CHF Patient with history of CHF is. Patient states was recently at [facility] for CHF exacerbation. No 2-D echo on file on Epic. Per patient's wife his recent EF was 35% at [facility] Patient is clinically dehydrated. Repeat 2-D echo with a EF of 35-40%. Will try to obtain records from [facility]. Continue to hold diuretics. Continue aspirin and Toprol, Cozaar.  #4 hypothyroidism TSH at 3.420. Continue home dose Synthroid.  #5 hyperlipidemia LDL of 73. Continue Lipitor.  #6 history of prostate cancer 5 years ago Status post surgery. Patient did not have radiation or chemotherapy. Outpatient follow-up.  #7 hypertension Continue metoprolol and Cozaar.  #8 diabetes mellitus Sliding scale insulin.  #9 prophylaxis Coumadin for DVT prophylaxis.  Code Status: DO NOT RESUSCITATE Family Communication: Updated patient and wife at  bedside. Disposition Plan: Remain inpatient.   Consultants:  None  Procedures:  Chest x-ray 06/12/2014  2-D echo 06/13/2014  Antibiotics:  None  HPI/Subjective: Patient denies any shortness of breath. Patient denies any chest pain. Patient states he's feeling better.  Objective: Filed Vitals:   06/14/14 0500  BP: 104/64  Pulse: 76  Temp: 97.5 F (36.4 C)  Resp: 18    Intake/Output Summary (Last 24 hours) at 06/14/14 1242 Last data filed at 06/14/14 0855  Gross per 24 hour  Intake   1340 ml  Output    975 ml  Net    365 ml   Filed Weights   06/13/14 0020 06/13/14 0616 06/14/14 0500  Weight: 60.918 kg (134 lb 4.8 oz) 61.598 kg (135 lb 12.8 oz) 62.4 kg (137 lb 9.1 oz)    Exam:   General:  NAD  Cardiovascular: RRR  Respiratory: CTAB  Abdomen: Soft/ND/NT/ND/+BS  Musculoskeletal: No clubbing cyanosis or edema.  Data Reviewed: Basic Metabolic Panel:  Recent Labs Lab 06/12/14 1750 06/13/14 0138 06/14/14 0521  NA 139 141 140  K 4.7 4.6 5.0  CL 99 102 106  CO2 27 31 30   GLUCOSE 130* 117* 143*  BUN 25* 27* 23  CREATININE 1.10 0.99 1.07  CALCIUM 9.8 8.8 8.8   Liver Function Tests:  Recent Labs Lab 06/13/14 0138  AST 27  ALT 37  ALKPHOS 78  BILITOT 1.6*  PROT 6.6  ALBUMIN 3.1*   No results for input(s): LIPASE, AMYLASE in the last 168 hours. No results for input(s): AMMONIA in the last 168 hours. CBC:  Recent Labs Lab 06/12/14 1750 06/13/14 0138 06/14/14 0521  WBC 10.8* 10.6* 8.3  HGB 14.4 12.5* 11.7*  HCT 43.6 37.8* 36.3*  MCV 90.8 90.9 91.7  PLT 436* 369 333   Cardiac Enzymes:  Recent Labs Lab 06/12/14 1755 06/12/14  2102 06/13/14 0004 06/13/14 0659 06/13/14 1240  TROPONINI 0.05* 0.06* 0.06* 0.06* 0.06*   BNP (last 3 results)  Recent Labs  06/12/14 1750  BNP 60.5    ProBNP (last 3 results) No results for input(s): PROBNP in the last 8760 hours.  CBG:  Recent Labs Lab 06/13/14 1249 06/13/14 1752  06/13/14 2228 06/14/14 0743 06/14/14 1144  GLUCAP 182* 127* 131* 130* 148*    No results found for this or any previous visit (from the past 240 hour(s)).   Studies: Dg Chest 2 View  06/12/2014   CLINICAL DATA:  dizziness x 6 weeks and is getting worse. Chest pain. Initial encounter.  EXAM: CHEST  2 VIEW  COMPARISON:  [facility] 06/05/14 and earlier.  FINDINGS: The heart size and mediastinal contours are stable. Left chest AICD is stable. Both lungs are clear. The visualized skeletal structures are stable.  IMPRESSION: No active cardiopulmonary disease.   Electronically Signed   By: Genevie Ann M.D.   On: 06/12/2014 23:09    Scheduled Meds: . [START ON 06/15/2014] aspirin EC  81 mg Oral Once per day on Mon Wed Fri  . atorvastatin  10 mg Oral QPM  . digoxin  0.125 mg Oral Daily  . insulin aspart  0-9 Units Subcutaneous TID WC  . levothyroxine  75 mcg Oral QAC breakfast  . losartan  50 mg Oral Daily  . meclizine  25 mg Oral BID  . meloxicam  7.5 mg Oral Daily  . metoprolol succinate  50 mg Oral Daily  . pantoprazole  40 mg Oral Daily  . sodium chloride  500 mL Intravenous Once  . sodium chloride  3 mL Intravenous Q12H  . Warfarin - Pharmacist Dosing Inpatient   Does not apply q1800   Continuous Infusions: . sodium chloride 75 mL/hr at 06/14/14 0840    Principal Problem:   Elevated troponin Active Problems:   CAD (coronary artery disease)   AICD (automatic cardioverter/defibrillator) present   A-fib   Prostate cancer   Hypothyroidism   Essential hypertension   HLD (hyperlipidemia)   GERD (gastroesophageal reflux disease)   Weakness generalized    Time spent: 108 mins    [facility] MD [name] Pager [phone]. If 7PM-7AM, please contact night-coverage at [email or web address], password [facility] 06/14/2014, 12:42 PM  LOS: 2 days

## 2014-06-14 NOTE — Progress Notes (Signed)
INITIAL NUTRITION ASSESSMENT  DOCUMENTATION CODES Per approved criteria  -Not Applicable   INTERVENTION: Provide Glucerna Shake po BID, each supplement provides 220 kcal and 10 grams of protein Follow-up at a later date to provide DM diet education  NUTRITION DIAGNOSIS: Inadequate oral intake related to decreased appetite as evidenced by family report.   Goal: Pt to meet >/= 90% of their estimated nutrition needs   Monitor:  PO and supplemental intake, weight, labs, I/O's  Reason for Assessment: Pt identified as at nutrition risk on the Malnutrition Screen Tool  Admitting Dx: Elevated troponin  ASSESSMENT: 74 y.o. male with past medical history of AICD placement, hypertension, hyperlipidemia, hypothyroidism, GERD, congestive heart failure, atrial fibrillation on Coumadin, history of prostate cancer, coronary artery disease, who presents with a generalized weakness, and dizziness.  Pt asleep in room with wife at bedside. Per wife, pt's appetite has decreased, pt may eat 1-2 meals a day. Pt gets full very quickly.  PO intake: 100% Per wife, pt was just diagnosed with diabetes. Wife requests diabetes diet education at a later time when pt is awake. RD to follow-up. Due to weight loss, pt may benefit from nutritional supplement, RD to order.  Labs reviewed: WNL  Height: Ht Readings from Last 1 Encounters:  06/13/14 5\' 10"  (1.778 m)    Weight: Wt Readings from Last 1 Encounters:  06/14/14 137 lb 9.1 oz (62.4 kg)    Ideal Body Weight: 166 lb  % Ideal Body Weight: 83%  Wt Readings from Last 10 Encounters:  06/14/14 137 lb 9.1 oz (62.4 kg)  12/02/12 154 lb (69.854 kg)    Usual Body Weight: 150 lb -per pt's wife  % Usual Body Weight: 91%  BMI:  Body mass index is 19.74 kg/(m^2).  Estimated Nutritional Needs: Kcal: 1600-1800 Protein: 75-85 Fluid: 1.6L/day  Skin: intact  Diet Order: Diet Heart  EDUCATION NEEDS: -Education not appropriate at this  time   Intake/Output Summary (Last 24 hours) at 06/14/14 1249 Last data filed at 06/14/14 0855  Gross per 24 hour  Intake   1340 ml  Output    975 ml  Net    365 ml    Last BM: 2/13   Labs:   Recent Labs Lab 06/12/14 1750 06/13/14 0138 06/14/14 0521  NA 139 141 140  K 4.7 4.6 5.0  CL 99 102 106  CO2 27 31 30   BUN 25* 27* 23  CREATININE 1.10 0.99 1.07  CALCIUM 9.8 8.8 8.8  GLUCOSE 130* 117* 143*    CBG (last 3)   Recent Labs  06/13/14 2228 06/14/14 0743 06/14/14 1144  GLUCAP 131* 130* 148*    Scheduled Meds: . [START ON 06/15/2014] aspirin EC  81 mg Oral Once per day on Mon Wed Fri  . atorvastatin  10 mg Oral QPM  . digoxin  0.125 mg Oral Daily  . insulin aspart  0-9 Units Subcutaneous TID WC  . levothyroxine  75 mcg Oral QAC breakfast  . losartan  50 mg Oral Daily  . meclizine  25 mg Oral BID  . meloxicam  7.5 mg Oral Daily  . metoprolol succinate  50 mg Oral Daily  . pantoprazole  40 mg Oral Daily  . sodium chloride  500 mL Intravenous Once  . sodium chloride  3 mL Intravenous Q12H  . Warfarin - Pharmacist Dosing Inpatient   Does not apply q1800    Continuous Infusions: . sodium chloride 75 mL/hr at 06/14/14 0840    Past Medical  History  Diagnosis Date  . Kidney stones   . Prostate cancer   . MI (myocardial infarction)   . Coronary artery disease   . Hyperlipidemia   . Hypertension   . Automatic implantable cardioverter-defibrillator in situ   . Hypothyroidism   . Pacemaker   . GERD (gastroesophageal reflux disease)   . Hard of hearing   . CHF (congestive heart failure)   . Dysrhythmia     atrial fib  . Cardiomegaly     Past Surgical History  Procedure Laterality Date  . Prostatectomy    . Pacemaker insertion    . Cardiac defibrillator placement    . Hernia repair    . Cystoscopy with litholapaxy N/A 01/24/2013    Procedure: CYSTOSCOPY WITH LITHOLAPAXY;  Surgeon: Ardis Hughs, MD;  Location: [facility];  Service: Urology;   Laterality: N/A;  . Holmium laser application N/A 09/22/8183    Procedure: HOLMIUM LASER APPLICATION;  Surgeon: Ardis Hughs, MD;  Location: [facility];  Service: Urology;  Laterality: N/A;    Clayton Bibles, MS, RD, LDN Pager: [phone] After Hours Pager: [phone]

## 2014-06-15 DIAGNOSIS — I5022 Chronic systolic (congestive) heart failure: Secondary | ICD-10-CM

## 2014-06-15 LAB — PROTIME-INR
INR: 2.12 — ABNORMAL HIGH (ref 0.00–1.49)
Prothrombin Time: 23.9 seconds — ABNORMAL HIGH (ref 11.6–15.2)

## 2014-06-15 LAB — BASIC METABOLIC PANEL
Anion gap: 2 — ABNORMAL LOW (ref 5–15)
BUN: 12 mg/dL (ref 6–23)
CALCIUM: 8.1 mg/dL — AB (ref 8.4–10.5)
CO2: 26 mmol/L (ref 19–32)
CREATININE: 0.75 mg/dL (ref 0.50–1.35)
Chloride: 110 mmol/L (ref 96–112)
GFR calc Af Amer: >90 mL/min (ref >90)
GFR calc non Af Amer: 89 mL/min — ABNORMAL LOW (ref >90)
Glucose, Bld: 115 mg/dL — ABNORMAL HIGH (ref 70–99)
POTASSIUM: 3.8 mmol/L (ref 3.5–5.1)
SODIUM: 138 mmol/L (ref 135–145)

## 2014-06-15 LAB — CBC
HEMATOCRIT: 35.2 % — AB (ref 39.0–52.0)
HEMOGLOBIN: 11.3 g/dL — AB (ref 13.0–17.0)
MCH: 29.8 pg (ref 26.0–34.0)
MCHC: 32.1 g/dL (ref 30.0–36.0)
MCV: 92.9 fL (ref 78.0–100.0)
Platelets: 311 10*3/uL (ref 150–400)
RBC: 3.79 MIL/uL — ABNORMAL LOW (ref 4.22–5.81)
RDW: 15.3 % (ref 11.5–15.5)
WBC: 8.1 10*3/uL (ref 4.0–10.5)

## 2014-06-15 LAB — GLUCOSE, CAPILLARY
GLUCOSE-CAPILLARY: 96 mg/dL (ref 70–99)
Glucose-Capillary: 133 mg/dL — ABNORMAL HIGH (ref 70–99)

## 2014-06-15 LAB — PSA: PSA: <0.01 ng/mL (ref <=4.00)

## 2014-06-15 MED ORDER — GLUCERNA SHAKE PO LIQD: 237.0000 mL | Freq: Two times a day (BID) | ORAL | Status: DC

## 2014-06-15 MED ORDER — FUROSEMIDE 40 MG PO TABS: 40.0000 mg | ORAL_TABLET | Freq: Every day | ORAL | Status: DC

## 2014-06-15 MED ORDER — WARFARIN SODIUM 2 MG PO TABS
2.0000 mg | ORAL_TABLET | Freq: Once | ORAL | Status: DC
  Filled 2014-06-15: qty 1

## 2014-06-15 MED ORDER — FUROSEMIDE 80 MG PO TABS
40.0000 mg | ORAL_TABLET | Freq: Every day | ORAL | Status: DC
Start: 2014-06-17 — End: 2014-06-15

## 2014-06-15 NOTE — Discharge Summary (Addendum)
Physician Discharge Summary  LYALL FACIANE GHW:[id number] DOB: 12-26-1940 DOA: 06/12/2014  PCP: Gilford Rile, MD  Admit date: 06/12/2014 Discharge date: 06/15/2014  Time spent: 70 minutes  Recommendations for Outpatient Follow-up:  1. Follow-up with Gilford Rile, MD in 1 week. On follow-up patient's diabetes will need to be reassessed and it would need to be determined as to whether patient is to be placed on insulin as at outside hospital his A1c was 11.7 and patient had been discharged on metformin. Basic metabolic profile new to be obtained to follow-up on patient's electrolytes and renal function. May be a repeat A1c might need to be obtained. 2. Follow-up with his cardiologist Dr. Lowella Petties, [facility], [place], in 1 week/as scheduled. On follow-up patient's diuretics will need to be reassessed as he's been discharged on a lower dose of Lasix 40 mg daily. Spironolactone which was started outside hospital has been discontinued. Patient needs a basic metabolic profile done to follow-up on electrolytes and renal function.  Discharge Diagnoses:  Principal Problem:   Elevated troponin Active Problems:   CAD (coronary artery disease)   AICD (automatic cardioverter/defibrillator) present   A-fib   Prostate cancer   Hypothyroidism   Essential hypertension   HLD (hyperlipidemia)   GERD (gastroesophageal reflux disease)   Weakness generalized   DM (diabetes mellitus)   Orthostatic hypotension   Chronic systolic congestive heart failure   Discharge Condition: Stable and improved  Diet recommendation: Heart healthy  Filed Weights   06/13/14 0020 06/13/14 0616 06/14/14 0500  Weight: 60.918 kg (134 lb 4.8 oz) 61.598 kg (135 lb 12.8 oz) 62.4 kg (137 lb 9.1 oz)    History of present illness:  Mitchell Murray is a 74 y.o. male with past medical history of AICD placement, hypertension, hyperlipidemia, hypothyroidism, GERD, congestive heart failure, atrial  fibrillation on Coumadin, history of prostate cancer, coronary artery disease, who presented with a generalized weakness, and dizziness.  Patient was recently hospitalized in [facility] because of CHF exacerbation. He stayed in hospital for 2 days and was discharged home on Sunday. His Lasix dosage was increased to 80 mg daily, he is also started on spironolactone. He reported that after he went home, his health condition has not come back to his baseline. He still has generalized weakness. He started having new symptoms, including dizziness and lightheadedness. He did not have cough, chest pain, shortness of breath.   Patient denied fever, chills, abdominal pain, diarrhea, constipation, dysuria, urgency, frequency, hematuria, skin rashes or leg swelling. No unilateral weakness, numbness or tingling sensations. No vision change or hearing loss.  In ED, patient was found to have very dry mucus and membrane, INR 2.55, BNP 60.5, trop 0.05, mild leukocytosis with WBC 10.8, tachycardia, temperature normal. Patient was admitted to inpatient for further evaluation and treatment.  Hospital Course:  #1 generalized weakness/dizziness or lightheadedness/orthostasis Likely secondary to orthostasis. Patient's blood pressure is noted to be borderline/hypotensive with some systolics in the 78L. Patient was recently discharged from [facility] with high dose Lasix 80 mg daily in addition to 50 mg of spironolactone. On admission patient looked clinically dehydrated with no signs or symptoms of volume overload.Patient was slightly elevated troponin likely secondary to A. fib. Patient's diuretics were held and patient was hydrated gently with IV fluids. Patient improved clinically and was back to his baseline by day of discharge. 2-D echo does have a EF of 35-40% with moderate diffuse hypokinesis. 2-D echo from outside hospital results with similar ejection fraction with  no significant change. Patient will be  discharged on a decreased dose of Lasix 40 mg daily and is to follow-up with his cardiologist as outpatient.   #2 elevated troponin Patient's troponins are mildly elevated likely due to demand ischemia secondary to tachycardia. Patient remained asymptomatic. 2-D echo was obtained which showed an EF of 35-40% with moderate diffuse hypokinesis. Patient was maintained on his home regimen of aspirin, Lipitor, metoprolol, Cozaar. His home dose of Coumadin was continued. His INR remained therapeutic. Outpatient follow-up with his cardiologist.   #3 CHF Patient with history of CHF is. Patient stated was recently at [facility] for CHF exacerbation 06/05/2014 2 06/07/2014. No 2-D echo on file on Epic. Per patient's wife his recent EF was 35% at [facility]. Patient on admission was clinically dehydrated. Repeat 2-D echo with a EF of 35-40%. Per records from outside hospital patient was admitted with CHF exacerbation and discharged on higher dose of his Lasix at 80 mg daily as well as addition of spironolactone at 50 mg daily. Patient's diuretics were held secondary to orthostasis. Patient was maintained on home regimen of aspirin, Toprol, Cozaar. Patient improved clinically and was compensated. Patient will be discharged on Lasix 40 mg daily to resume in 2 days and is to follow-up with his cardiologist in 1 week for further medication titration.   #4 hypothyroidism TSH at 3.420. Continued on home dose Synthroid.  #5 hyperlipidemia LDL of 73. Continued on Lipitor.  #6 history of prostate cancer 5 years ago Status post surgery. Patient did not have radiation or chemotherapy. Outpatient follow-up.  #7 hypertension Continued on home regimen of metoprolol and Cozaar.  #8 diabetes mellitus Patient recently diagnosed diabetes mellitus at outside hospital around of hospital and had been started on metformin. A1c at outside hospital was 11.7. On admission patient's oral hypoglycemic agents were held.  Patient was maintained on a sliding scale insulin and CBGs were well controlled. Outpatient follow-up.   Procedures:  Chest x-ray 06/12/2014  2-D echo 06/13/2014  Consultations:  None  Discharge Exam: Filed Vitals:   06/15/14 1014  BP: 119/67  Pulse: 75  Temp:   Resp:     General: NAD Cardiovascular: RRR Respiratory: CTAB  Discharge Instructions   Discharge Instructions    Diet - low sodium heart healthy    Complete by:  As directed      Discharge instructions    Complete by:  As directed   Follow up with Gilford Rile, MD in 1 week. Follow up with cardiologist, Dr Joycelyn Rua in 1 week.     Increase activity slowly    Complete by:  As directed           Current Discharge Medication List    START taking these medications   Details  feeding supplement, GLUCERNA SHAKE, (GLUCERNA SHAKE) LIQD Take 237 mLs by mouth 2 (two) times daily between meals. Refills: 0      CONTINUE these medications which have CHANGED   Details  furosemide (LASIX) 40 MG tablet Take 1 tablet (40 mg total) by mouth daily. Qty: 30 tablet, Refills: 0      CONTINUE these medications which have NOT CHANGED   Details  aspirin EC 81 MG tablet Take 81 mg by mouth 3 (three) times a week. Mon, Wed, and Sunday.    atorvastatin (LIPITOR) 10 MG tablet Take 10 mg by mouth every evening.     digoxin (LANOXIN) 0.125 MG tablet Take 0.125 mg by mouth daily.    levothyroxine (SYNTHROID, LEVOTHROID)  75 MCG tablet Take 75 mcg by mouth daily before breakfast.    losartan (COZAAR) 50 MG tablet Take 50 mg by mouth daily.    meclizine (ANTIVERT) 25 MG tablet Take 25 mg by mouth 2 (two) times daily.    meloxicam (MOBIC) 7.5 MG tablet Take 7.5 mg by mouth daily.    metFORMIN (GLUCOPHAGE) 1000 MG tablet Take 1,000 mg by mouth 2 (two) times daily.    metoprolol succinate (TOPROL-XL) 50 MG 24 hr tablet Take 50 mg by mouth daily. Take with or immediately following a meal.    omeprazole (PRILOSEC) 40 MG  capsule Take 40 mg by mouth daily.    !! warfarin (COUMADIN) 2 MG tablet Take 2 mg by mouth every other day.    !! warfarin (COUMADIN) 2.5 MG tablet Take 2.5 mg by mouth every other day.    docusate sodium (COLACE) 50 MG capsule Take 2 capsules (100 mg total) by mouth 2 (two) times daily as needed for constipation. Qty: 10 capsule, Refills: 0    enoxaparin (LOVENOX) 100 MG/ML injection Inject 1 mL (100 mg total) into the skin daily. Resume in 48 hours if bleeding in catheter has cleared. Qty: 0 Syringe    oxyCODONE (ROXICODONE) 5 MG immediate release tablet Take 1 tablet (5 mg total) by mouth every 4 (four) hours as needed for pain. Qty: 15 tablet, Refills: 0     !! - Potential duplicate medications found. Please discuss with provider.    STOP taking these medications     spironolactone (ALDACTONE) 50 MG tablet        Allergies  Allergen Reactions  . Ivp Dye [Iodinated Diagnostic Agents] Anaphylaxis   Follow-up Information    Follow up with Gilford Rile, MD.   Specialty:  Internal Medicine   Contact information:   [place] [phone]       Schedule an appointment as soon as possible for a visit in 1 week to follow up.   Why:  F/U with cardiologist in1 week, as scheduled.       The results of significant diagnostics from this hospitalization (including imaging, microbiology, ancillary and laboratory) are listed below for reference.    Significant Diagnostic Studies: Dg Chest 2 View  06/12/2014   CLINICAL DATA:  dizziness x 6 weeks and is getting worse. Chest pain. Initial encounter.  EXAM: CHEST  2 VIEW  COMPARISON:  [facility] 06/05/14 and earlier.  FINDINGS: The heart size and mediastinal contours are stable. Left chest AICD is stable. Both lungs are clear. The visualized skeletal structures are stable.  IMPRESSION: No active cardiopulmonary disease.   Electronically Signed   By: Genevie Ann M.D.   On: 06/12/2014 23:09     Microbiology: No results found for this or any previous visit (from the past 240 hour(s)).   Labs: Basic Metabolic Panel:  Recent Labs Lab 06/12/14 1750 06/13/14 0138 06/14/14 0521 06/15/14 0425  NA 139 141 140 138  K 4.7 4.6 5.0 3.8  CL 99 102 106 110  CO2 27 31 30 26   GLUCOSE 130* 117* 143* 115*  BUN 25* 27* 23 12  CREATININE 1.10 0.99 1.07 0.75  CALCIUM 9.8 8.8 8.8 8.1*   Liver Function Tests:  Recent Labs Lab 06/13/14 0138  AST 27  ALT 37  ALKPHOS 78  BILITOT 1.6*  PROT 6.6  ALBUMIN 3.1*   No results for input(s): LIPASE, AMYLASE in the last 168 hours. No results for input(s): AMMONIA in  the last 168 hours. CBC:  Recent Labs Lab 06/12/14 1750 06/13/14 0138 06/14/14 0521 06/15/14 0425  WBC 10.8* 10.6* 8.3 8.1  HGB 14.4 12.5* 11.7* 11.3*  HCT 43.6 37.8* 36.3* 35.2*  MCV 90.8 90.9 91.7 92.9  PLT 436* 369 333 311   Cardiac Enzymes:  Recent Labs Lab 06/12/14 1755 06/12/14 2102 06/13/14 0004 06/13/14 0659 06/13/14 1240  TROPONINI 0.05* 0.06* 0.06* 0.06* 0.06*   BNP: BNP (last 3 results)  Recent Labs  06/12/14 1750  BNP 60.5    ProBNP (last 3 results) No results for input(s): PROBNP in the last 8760 hours.  CBG:  Recent Labs Lab 06/14/14 1144 06/14/14 1652 06/14/14 2339 06/15/14 0743 06/15/14 1203  GLUCAP 148* 145* 153* 96 133*       Signed:  Croix Presley MD [name] 06/15/2014, 1:19 PM

## 2014-06-15 NOTE — Progress Notes (Signed)
[facility] for Warfarin Indication: atrial fibrillation  Allergies  Allergen Reactions  . Ivp Dye [Iodinated Diagnostic Agents] Anaphylaxis    Patient Measurements: Height : Weight :  Vital Signs: Temp: 98.3 F (36.8 C) (02/14 2332) Temp Source: Oral (02/14 2332) BP: 123/65 mmHg (02/14 2332) Pulse Rate: 64 (02/14 2332)  Labs:  Recent Labs  06/12/14 1750  06/13/14 0004 06/13/14 0138 06/13/14 0659 06/13/14 1240 06/14/14 0521 06/15/14 0425  HGB 14.4  --   --  12.5*  --   --  11.7*  --   HCT 43.6  --   --  37.8*  --   --  36.3*  --   PLT 436*  --   --  369  --   --  333  --   LABPROT  --   < >  --  30.4*  --   --  24.2* 23.9*  INR  --   < >  --  2.88*  --   --  2.15* 2.12*  CREATININE 1.10  --   --  0.99  --   --  1.07  --   TROPONINI  --   < > 0.06*  --  0.06* 0.06*  --   --   < > = values in this interval not displayed.  Estimated Creatinine Clearance: 54.3 mL/min (by C-G formula based on Cr of 1.07).  Assessment: 74 yr male to ED with c/o weakness and dizziness. On Warfarin PTA for AFib. PMH includes AICD placement, HTN, CHF, h/o prostate cancer, CAD.   PTA Warfarin regimen is 2.5mg  alternating with 2mg  every other day.  Last dose warfarin taken on 2/12 (however pt cannot recall which strength he took on 2/12)  INR upon admission (2/12) = 2.55 (in therapeutic range)  Troponins slightly elevated, likely demand ischemia. Dig level 1.3, holding Lasix spironolactone. 2D echo: EF 35-40%  Today, 06/15/2014:  INR = 2.12 (therapeutic)  CBC: 2/14 - Hgb = 11.7, pltc WNL  Cardiac diet  Drug interactions: No acute interactions (on levothyroxine prior to admission), Home meds ASA and meloxicam also continued  Goal of Therapy:  INR 2-3   Plan:   Warfarin 2 mg today x 1 (attempt home dosing regimen)  Daily PT/INR  Doreene Eland, PharmD, BCPS.   Pager: [phone] 06/15/2014, 8:26 AM

## 2014-06-15 NOTE — Evaluation (Signed)
Occupational Therapy Evaluation Patient Details Name: KERBY BORNER MRN: [id number] DOB: 02-Jun-1940 Today's Date: 06/15/2014    History of Present Illness 74 y.o. male with past medical history of AICD placement, hypertension, hyperlipidemia, hypothyroidism, GERD, congestive heart failure, atrial fibrillation on Coumadin, history of prostate cancer, coronary artery disease, admitted with a generalized weakness and dizziness after recent admission to [facility] for CHF exacerbation   Clinical Impression   No further OT indicated    Follow Up Recommendations  No OT follow up    Equipment Recommendations  None recommended by OT    Recommendations for Other Services       Precautions / Restrictions Precautions Precautions: None      Mobility Bed Mobility Overal bed mobility: Modified Independent                Transfers Overall transfer level: Modified independent                    Balance Overall balance assessment: No apparent balance deficits (not formally assessed)                                          ADL Overall ADL's : At baseline                                                       Pertinent Vitals/Pain Pain Assessment: No/denies pain     Hand Dominance     Extremity/Trunk Assessment Upper Extremity Assessment Upper Extremity Assessment: Overall WFL for tasks assessed   Lower Extremity Assessment Lower Extremity Assessment: Overall WFL for tasks assessed   Cervical / Trunk Assessment Cervical / Trunk Assessment: Normal   Communication Communication Communication: No difficulties   Cognition Arousal/Alertness: Awake/alert Behavior During Therapy: WFL for tasks assessed/performed Overall Cognitive Status: Within Functional Limits for tasks assessed                     General Comments       Exercises       Shoulder Instructions      Home Living Family/patient  expects to be discharged to:: Private residence Living Arrangements: Spouse/significant other   Type of Home: House       Home Layout: One level     Bathroom Shower/Tub: Occupational psychologist: Standard     Home Equipment: None          Prior Functioning/Environment Level of Independence: Independent             OT Diagnosis:     OT Problem List:     OT Treatment/Interventions:      OT Goals(Current goals can be found in the care plan section) Acute Rehab OT Goals Patient Stated Goal: home today  OT Frequency:     Barriers to D/C:            Co-evaluation              End of Session Nurse Communication: Mobility status  Activity Tolerance: Patient tolerated treatment well Patient left: in chair;with call bell/phone within reach;with chair alarm set   Time: 3893-7342 OT Time Calculation (min): 12 min Charges:  OT General Charges $OT Visit:  1 Procedure OT Evaluation $Initial OT Evaluation Tier I: 1 Procedure G-Codes:    Betsy Pries 07-04-14, 12:35 PM

## 2014-06-15 NOTE — Evaluation (Signed)
Physical Therapy One Time Evaluation Patient Details Name: Mitchell Murray MRN: [id number] DOB: 1941/01/13 Today's Date: 06/15/2014   History of Present Illness  74 y.o. male with past medical history of AICD placement, hypertension, hyperlipidemia, hypothyroidism, GERD, congestive heart failure, atrial fibrillation on Coumadin, history of prostate cancer, coronary artery disease, admitted with a generalized weakness and dizziness after recent admission to [facility] for CHF exacerbation  Clinical Impression  Patient evaluated by Physical Therapy with no further acute PT needs identified. All education has been completed and the patient has no further questions.  Pt ambulated around unit without difficulty and denies any symptoms other then headache. No follow-up Physical Therapy or equipment needs identified at this time. PT is signing off. Thank you for this referral.     Follow Up Recommendations No PT follow up    Equipment Recommendations  None recommended by PT    Recommendations for Other Services       Precautions / Restrictions Precautions Precautions: Fall Restrictions Weight Bearing Restrictions: No      Mobility  Bed Mobility Overal bed mobility: Modified Independent                Transfers Overall transfer level: Modified independent                  Ambulation/Gait Ambulation/Gait assistance: Supervision;Modified independent (Device/Increase time) Ambulation Distance (Feet): 400 Feet Assistive device: None Gait Pattern/deviations: WFL(Within Functional Limits)     General Gait Details: pt denies dizziness, SpO2 mid90s on room air during ambulation, reports mild headache after ambulation (see vitals)  Stairs            Wheelchair Mobility    Modified Rankin (Stroke Patients Only)       Balance Overall balance assessment: No apparent balance deficits (not formally assessed)                                           Pertinent Vitals/Pain Pain Assessment: No/denies pain  SpO2 room air stayed around 95% during gait Post ambulation vitals: 145/72 mmHg, 95% SpO2 room air, 75 bpm    Home Living Family/patient expects to be discharged to:: Private residence Living Arrangements: Spouse/significant other   Type of Home: House       Home Layout: One level Home Equipment: None      Prior Function Level of Independence: Independent               Hand Dominance        Extremity/Trunk Assessment               Lower Extremity Assessment: Overall WFL for tasks assessed      Cervical / Trunk Assessment: Normal  Communication   Communication: No difficulties  Cognition Arousal/Alertness: Awake/alert Behavior During Therapy: WFL for tasks assessed/performed Overall Cognitive Status: Within Functional Limits for tasks assessed                      General Comments      Exercises        Assessment/Plan    PT Assessment Patent does not need any further PT services  PT Diagnosis     PT Problem List    PT Treatment Interventions     PT Goals (Current goals can be found in the Care Plan section) Acute Rehab PT Goals PT Goal Formulation:  All assessment and education complete, DC therapy    Frequency     Barriers to discharge        Co-evaluation               End of Session   Activity Tolerance: Patient tolerated treatment well Patient left: in chair;with call bell/phone within reach Nurse Communication: Patient requests pain meds (for headache)         Time: 9295-7473 PT Time Calculation (min) (ACUTE ONLY): 13 min   Charges:   PT Evaluation $Initial PT Evaluation Tier I: 1 Procedure     PT G Codes:        Oluwadamilola Deliz,KATHrine E 06/15/2014, 10:32 AM Carmelia Bake, PT, DPT 06/15/2014 Pager: [phone]

## 2015-02-01 DIAGNOSIS — C61 Malignant neoplasm of prostate: Secondary | ICD-10-CM | POA: Diagnosis not present

## 2015-02-01 DIAGNOSIS — I420 Dilated cardiomyopathy: Secondary | ICD-10-CM | POA: Diagnosis not present

## 2015-02-01 DIAGNOSIS — Z23 Encounter for immunization: Secondary | ICD-10-CM | POA: Diagnosis not present

## 2015-02-01 DIAGNOSIS — E119 Type 2 diabetes mellitus without complications: Secondary | ICD-10-CM | POA: Diagnosis not present

## 2015-02-01 DIAGNOSIS — I10 Essential (primary) hypertension: Secondary | ICD-10-CM | POA: Diagnosis not present

## 2015-02-04 DIAGNOSIS — Z5181 Encounter for therapeutic drug level monitoring: Secondary | ICD-10-CM | POA: Diagnosis not present

## 2015-02-04 DIAGNOSIS — Z7901 Long term (current) use of anticoagulants: Secondary | ICD-10-CM | POA: Diagnosis not present

## 2015-02-24 DIAGNOSIS — I498 Other specified cardiac arrhythmias: Secondary | ICD-10-CM | POA: Diagnosis not present

## 2015-02-24 DIAGNOSIS — Z4502 Encounter for adjustment and management of automatic implantable cardiac defibrillator: Secondary | ICD-10-CM | POA: Diagnosis not present

## 2015-03-26 DIAGNOSIS — I25118 Atherosclerotic heart disease of native coronary artery with other forms of angina pectoris: Secondary | ICD-10-CM | POA: Diagnosis not present

## 2015-03-26 DIAGNOSIS — Z7901 Long term (current) use of anticoagulants: Secondary | ICD-10-CM | POA: Diagnosis not present

## 2015-04-06 DIAGNOSIS — D485 Neoplasm of uncertain behavior of skin: Secondary | ICD-10-CM | POA: Diagnosis not present

## 2015-04-06 DIAGNOSIS — L570 Actinic keratosis: Secondary | ICD-10-CM | POA: Diagnosis not present

## 2015-04-06 DIAGNOSIS — L308 Other specified dermatitis: Secondary | ICD-10-CM | POA: Diagnosis not present

## 2015-04-06 DIAGNOSIS — H61002 Unspecified perichondritis of left external ear: Secondary | ICD-10-CM | POA: Diagnosis not present

## 2015-04-08 DIAGNOSIS — Z4502 Encounter for adjustment and management of automatic implantable cardiac defibrillator: Secondary | ICD-10-CM | POA: Diagnosis not present

## 2015-04-08 DIAGNOSIS — I429 Cardiomyopathy, unspecified: Secondary | ICD-10-CM | POA: Diagnosis not present

## 2015-05-04 DIAGNOSIS — E038 Other specified hypothyroidism: Secondary | ICD-10-CM | POA: Diagnosis not present

## 2015-05-04 DIAGNOSIS — C61 Malignant neoplasm of prostate: Secondary | ICD-10-CM | POA: Diagnosis not present

## 2015-05-04 DIAGNOSIS — I2510 Atherosclerotic heart disease of native coronary artery without angina pectoris: Secondary | ICD-10-CM | POA: Diagnosis not present

## 2015-05-04 DIAGNOSIS — I420 Dilated cardiomyopathy: Secondary | ICD-10-CM | POA: Diagnosis not present

## 2015-05-04 DIAGNOSIS — I25110 Atherosclerotic heart disease of native coronary artery with unstable angina pectoris: Secondary | ICD-10-CM | POA: Diagnosis not present

## 2015-05-04 DIAGNOSIS — E784 Other hyperlipidemia: Secondary | ICD-10-CM | POA: Diagnosis not present

## 2015-05-04 DIAGNOSIS — E119 Type 2 diabetes mellitus without complications: Secondary | ICD-10-CM | POA: Diagnosis not present

## 2015-05-20 DIAGNOSIS — Z77122 Contact with and (suspected) exposure to noise: Secondary | ICD-10-CM | POA: Diagnosis not present

## 2015-05-20 DIAGNOSIS — H9193 Unspecified hearing loss, bilateral: Secondary | ICD-10-CM | POA: Diagnosis not present

## 2015-05-20 DIAGNOSIS — J342 Deviated nasal septum: Secondary | ICD-10-CM | POA: Diagnosis not present

## 2015-05-20 DIAGNOSIS — H9313 Tinnitus, bilateral: Secondary | ICD-10-CM | POA: Diagnosis not present

## 2015-05-21 DIAGNOSIS — I498 Other specified cardiac arrhythmias: Secondary | ICD-10-CM | POA: Diagnosis not present

## 2015-05-21 DIAGNOSIS — Z4502 Encounter for adjustment and management of automatic implantable cardiac defibrillator: Secondary | ICD-10-CM | POA: Diagnosis not present

## 2015-06-21 DIAGNOSIS — I4891 Unspecified atrial fibrillation: Secondary | ICD-10-CM | POA: Diagnosis not present

## 2015-07-08 DIAGNOSIS — Z950 Presence of cardiac pacemaker: Secondary | ICD-10-CM | POA: Diagnosis not present

## 2015-08-02 DIAGNOSIS — Z6822 Body mass index (BMI) 22.0-22.9, adult: Secondary | ICD-10-CM | POA: Diagnosis not present

## 2015-08-02 DIAGNOSIS — E784 Other hyperlipidemia: Secondary | ICD-10-CM | POA: Diagnosis not present

## 2015-08-02 DIAGNOSIS — E038 Other specified hypothyroidism: Secondary | ICD-10-CM | POA: Diagnosis not present

## 2015-08-02 DIAGNOSIS — E119 Type 2 diabetes mellitus without complications: Secondary | ICD-10-CM | POA: Diagnosis not present

## 2015-08-02 DIAGNOSIS — J301 Allergic rhinitis due to pollen: Secondary | ICD-10-CM | POA: Diagnosis not present

## 2015-08-02 DIAGNOSIS — E1140 Type 2 diabetes mellitus with diabetic neuropathy, unspecified: Secondary | ICD-10-CM | POA: Diagnosis not present

## 2015-08-05 DIAGNOSIS — I429 Cardiomyopathy, unspecified: Secondary | ICD-10-CM | POA: Diagnosis not present

## 2015-08-05 DIAGNOSIS — I2510 Atherosclerotic heart disease of native coronary artery without angina pectoris: Secondary | ICD-10-CM | POA: Diagnosis not present

## 2015-08-05 DIAGNOSIS — I482 Chronic atrial fibrillation: Secondary | ICD-10-CM | POA: Diagnosis not present

## 2015-08-05 DIAGNOSIS — I479 Paroxysmal tachycardia, unspecified: Secondary | ICD-10-CM | POA: Diagnosis not present

## 2015-08-05 DIAGNOSIS — Z4502 Encounter for adjustment and management of automatic implantable cardiac defibrillator: Secondary | ICD-10-CM | POA: Diagnosis not present

## 2015-08-05 DIAGNOSIS — I10 Essential (primary) hypertension: Secondary | ICD-10-CM | POA: Diagnosis not present

## 2015-08-18 DIAGNOSIS — Z7901 Long term (current) use of anticoagulants: Secondary | ICD-10-CM | POA: Diagnosis not present

## 2015-08-18 DIAGNOSIS — I4891 Unspecified atrial fibrillation: Secondary | ICD-10-CM | POA: Diagnosis not present

## 2015-08-18 DIAGNOSIS — L570 Actinic keratosis: Secondary | ICD-10-CM | POA: Diagnosis not present

## 2015-08-18 DIAGNOSIS — L821 Other seborrheic keratosis: Secondary | ICD-10-CM | POA: Diagnosis not present

## 2015-08-18 DIAGNOSIS — L578 Other skin changes due to chronic exposure to nonionizing radiation: Secondary | ICD-10-CM | POA: Diagnosis not present

## 2015-08-20 DIAGNOSIS — Z4502 Encounter for adjustment and management of automatic implantable cardiac defibrillator: Secondary | ICD-10-CM | POA: Diagnosis not present

## 2015-08-20 DIAGNOSIS — I479 Paroxysmal tachycardia, unspecified: Secondary | ICD-10-CM | POA: Diagnosis not present

## 2015-08-26 DIAGNOSIS — I482 Chronic atrial fibrillation: Secondary | ICD-10-CM | POA: Diagnosis not present

## 2015-11-01 DIAGNOSIS — Z6821 Body mass index (BMI) 21.0-21.9, adult: Secondary | ICD-10-CM | POA: Diagnosis not present

## 2015-11-01 DIAGNOSIS — I4891 Unspecified atrial fibrillation: Secondary | ICD-10-CM | POA: Diagnosis not present

## 2015-11-01 DIAGNOSIS — E119 Type 2 diabetes mellitus without complications: Secondary | ICD-10-CM | POA: Diagnosis not present

## 2015-11-01 DIAGNOSIS — E784 Other hyperlipidemia: Secondary | ICD-10-CM | POA: Diagnosis not present

## 2015-11-01 DIAGNOSIS — Z0000 Encounter for general adult medical examination without abnormal findings: Secondary | ICD-10-CM | POA: Diagnosis not present

## 2015-11-01 DIAGNOSIS — Z9181 History of falling: Secondary | ICD-10-CM | POA: Diagnosis not present

## 2015-11-01 DIAGNOSIS — J301 Allergic rhinitis due to pollen: Secondary | ICD-10-CM | POA: Diagnosis not present

## 2015-11-01 DIAGNOSIS — Z1389 Encounter for screening for other disorder: Secondary | ICD-10-CM | POA: Diagnosis not present

## 2015-11-09 DIAGNOSIS — I4891 Unspecified atrial fibrillation: Secondary | ICD-10-CM | POA: Diagnosis not present

## 2015-11-09 DIAGNOSIS — Z6821 Body mass index (BMI) 21.0-21.9, adult: Secondary | ICD-10-CM | POA: Diagnosis not present

## 2015-11-15 DIAGNOSIS — I4891 Unspecified atrial fibrillation: Secondary | ICD-10-CM | POA: Diagnosis not present

## 2015-11-19 DIAGNOSIS — Z95810 Presence of automatic (implantable) cardiac defibrillator: Secondary | ICD-10-CM | POA: Diagnosis not present

## 2015-12-02 DIAGNOSIS — I10 Essential (primary) hypertension: Secondary | ICD-10-CM | POA: Diagnosis not present

## 2015-12-02 DIAGNOSIS — I420 Dilated cardiomyopathy: Secondary | ICD-10-CM | POA: Diagnosis not present

## 2015-12-02 DIAGNOSIS — Z6821 Body mass index (BMI) 21.0-21.9, adult: Secondary | ICD-10-CM | POA: Diagnosis not present

## 2015-12-02 DIAGNOSIS — E079 Disorder of thyroid, unspecified: Secondary | ICD-10-CM | POA: Diagnosis not present

## 2015-12-02 DIAGNOSIS — I4891 Unspecified atrial fibrillation: Secondary | ICD-10-CM | POA: Diagnosis not present

## 2015-12-16 DIAGNOSIS — I4891 Unspecified atrial fibrillation: Secondary | ICD-10-CM | POA: Diagnosis not present

## 2015-12-22 DIAGNOSIS — N5201 Erectile dysfunction due to arterial insufficiency: Secondary | ICD-10-CM | POA: Diagnosis not present

## 2015-12-22 DIAGNOSIS — Z87442 Personal history of urinary calculi: Secondary | ICD-10-CM | POA: Diagnosis not present

## 2015-12-22 DIAGNOSIS — Z8546 Personal history of malignant neoplasm of prostate: Secondary | ICD-10-CM | POA: Diagnosis not present

## 2015-12-27 DIAGNOSIS — I429 Cardiomyopathy, unspecified: Secondary | ICD-10-CM | POA: Diagnosis not present

## 2015-12-27 DIAGNOSIS — E782 Mixed hyperlipidemia: Secondary | ICD-10-CM | POA: Diagnosis not present

## 2015-12-27 DIAGNOSIS — I4891 Unspecified atrial fibrillation: Secondary | ICD-10-CM | POA: Diagnosis not present

## 2015-12-27 DIAGNOSIS — I480 Paroxysmal atrial fibrillation: Secondary | ICD-10-CM | POA: Diagnosis not present

## 2015-12-27 DIAGNOSIS — I10 Essential (primary) hypertension: Secondary | ICD-10-CM | POA: Diagnosis not present

## 2015-12-27 DIAGNOSIS — I2510 Atherosclerotic heart disease of native coronary artery without angina pectoris: Secondary | ICD-10-CM | POA: Diagnosis not present

## 2015-12-27 DIAGNOSIS — Z7901 Long term (current) use of anticoagulants: Secondary | ICD-10-CM | POA: Diagnosis not present

## 2016-01-11 DIAGNOSIS — I2510 Atherosclerotic heart disease of native coronary artery without angina pectoris: Secondary | ICD-10-CM | POA: Diagnosis not present

## 2016-01-11 DIAGNOSIS — I429 Cardiomyopathy, unspecified: Secondary | ICD-10-CM | POA: Diagnosis not present

## 2016-01-31 DIAGNOSIS — I4891 Unspecified atrial fibrillation: Secondary | ICD-10-CM | POA: Diagnosis not present

## 2016-02-01 DIAGNOSIS — Z23 Encounter for immunization: Secondary | ICD-10-CM | POA: Diagnosis not present

## 2016-02-01 DIAGNOSIS — E784 Other hyperlipidemia: Secondary | ICD-10-CM | POA: Diagnosis not present

## 2016-02-01 DIAGNOSIS — E119 Type 2 diabetes mellitus without complications: Secondary | ICD-10-CM | POA: Diagnosis not present

## 2016-02-01 DIAGNOSIS — E038 Other specified hypothyroidism: Secondary | ICD-10-CM | POA: Diagnosis not present

## 2016-02-01 DIAGNOSIS — I420 Dilated cardiomyopathy: Secondary | ICD-10-CM | POA: Diagnosis not present

## 2016-02-01 DIAGNOSIS — Z6822 Body mass index (BMI) 22.0-22.9, adult: Secondary | ICD-10-CM | POA: Diagnosis not present

## 2016-02-01 DIAGNOSIS — I10 Essential (primary) hypertension: Secondary | ICD-10-CM | POA: Diagnosis not present

## 2016-02-21 DIAGNOSIS — Z95810 Presence of automatic (implantable) cardiac defibrillator: Secondary | ICD-10-CM | POA: Diagnosis not present

## 2016-02-28 DIAGNOSIS — Z7901 Long term (current) use of anticoagulants: Secondary | ICD-10-CM | POA: Diagnosis not present

## 2016-03-07 DIAGNOSIS — H25813 Combined forms of age-related cataract, bilateral: Secondary | ICD-10-CM | POA: Diagnosis not present

## 2016-03-07 DIAGNOSIS — E119 Type 2 diabetes mellitus without complications: Secondary | ICD-10-CM | POA: Diagnosis not present

## 2016-03-31 DIAGNOSIS — I4891 Unspecified atrial fibrillation: Secondary | ICD-10-CM | POA: Diagnosis not present

## 2016-04-13 DIAGNOSIS — J019 Acute sinusitis, unspecified: Secondary | ICD-10-CM | POA: Diagnosis not present

## 2016-04-13 DIAGNOSIS — J301 Allergic rhinitis due to pollen: Secondary | ICD-10-CM | POA: Diagnosis not present

## 2016-04-27 DIAGNOSIS — Z4502 Encounter for adjustment and management of automatic implantable cardiac defibrillator: Secondary | ICD-10-CM | POA: Diagnosis not present

## 2016-04-27 DIAGNOSIS — I429 Cardiomyopathy, unspecified: Secondary | ICD-10-CM | POA: Diagnosis not present

## 2016-05-02 DIAGNOSIS — I480 Paroxysmal atrial fibrillation: Secondary | ICD-10-CM | POA: Diagnosis not present

## 2016-05-03 DIAGNOSIS — J301 Allergic rhinitis due to pollen: Secondary | ICD-10-CM | POA: Diagnosis not present

## 2016-05-03 DIAGNOSIS — Z6822 Body mass index (BMI) 22.0-22.9, adult: Secondary | ICD-10-CM | POA: Diagnosis not present

## 2016-05-03 DIAGNOSIS — I4891 Unspecified atrial fibrillation: Secondary | ICD-10-CM | POA: Diagnosis not present

## 2016-05-03 DIAGNOSIS — I420 Dilated cardiomyopathy: Secondary | ICD-10-CM | POA: Diagnosis not present

## 2016-05-03 DIAGNOSIS — I10 Essential (primary) hypertension: Secondary | ICD-10-CM | POA: Diagnosis not present

## 2016-05-03 DIAGNOSIS — E119 Type 2 diabetes mellitus without complications: Secondary | ICD-10-CM | POA: Diagnosis not present

## 2016-05-03 DIAGNOSIS — E784 Other hyperlipidemia: Secondary | ICD-10-CM | POA: Diagnosis not present

## 2016-05-30 DIAGNOSIS — I4891 Unspecified atrial fibrillation: Secondary | ICD-10-CM | POA: Diagnosis not present

## 2016-06-19 DIAGNOSIS — E782 Mixed hyperlipidemia: Secondary | ICD-10-CM | POA: Diagnosis not present

## 2016-06-19 DIAGNOSIS — I2510 Atherosclerotic heart disease of native coronary artery without angina pectoris: Secondary | ICD-10-CM | POA: Diagnosis not present

## 2016-06-19 DIAGNOSIS — I10 Essential (primary) hypertension: Secondary | ICD-10-CM | POA: Diagnosis not present

## 2016-06-19 DIAGNOSIS — I255 Ischemic cardiomyopathy: Secondary | ICD-10-CM | POA: Diagnosis not present

## 2016-06-19 DIAGNOSIS — I480 Paroxysmal atrial fibrillation: Secondary | ICD-10-CM | POA: Diagnosis not present

## 2016-07-03 DIAGNOSIS — I4891 Unspecified atrial fibrillation: Secondary | ICD-10-CM | POA: Diagnosis not present

## 2016-10-16 DIAGNOSIS — C61 Malignant neoplasm of prostate: Secondary | ICD-10-CM | POA: Diagnosis not present

## 2016-10-16 DIAGNOSIS — E784 Other hyperlipidemia: Secondary | ICD-10-CM | POA: Diagnosis not present

## 2016-10-16 DIAGNOSIS — M1990 Unspecified osteoarthritis, unspecified site: Secondary | ICD-10-CM | POA: Diagnosis not present

## 2016-10-16 DIAGNOSIS — E063 Autoimmune thyroiditis: Secondary | ICD-10-CM | POA: Diagnosis not present

## 2016-10-16 DIAGNOSIS — R791 Abnormal coagulation profile: Secondary | ICD-10-CM | POA: Diagnosis not present

## 2016-10-16 DIAGNOSIS — I10 Essential (primary) hypertension: Secondary | ICD-10-CM | POA: Diagnosis not present

## 2016-10-16 DIAGNOSIS — E119 Type 2 diabetes mellitus without complications: Secondary | ICD-10-CM | POA: Diagnosis not present

## 2016-10-24 DIAGNOSIS — R791 Abnormal coagulation profile: Secondary | ICD-10-CM | POA: Diagnosis not present

## 2016-10-24 DIAGNOSIS — Z6822 Body mass index (BMI) 22.0-22.9, adult: Secondary | ICD-10-CM | POA: Diagnosis not present

## 2016-10-24 DIAGNOSIS — I4891 Unspecified atrial fibrillation: Secondary | ICD-10-CM | POA: Diagnosis not present

## 2016-11-07 DIAGNOSIS — Z7901 Long term (current) use of anticoagulants: Secondary | ICD-10-CM | POA: Diagnosis not present

## 2016-11-07 DIAGNOSIS — Z9181 History of falling: Secondary | ICD-10-CM | POA: Diagnosis not present

## 2016-11-07 DIAGNOSIS — Z6822 Body mass index (BMI) 22.0-22.9, adult: Secondary | ICD-10-CM | POA: Diagnosis not present

## 2016-11-07 DIAGNOSIS — I4891 Unspecified atrial fibrillation: Secondary | ICD-10-CM | POA: Diagnosis not present

## 2016-11-07 DIAGNOSIS — Z1389 Encounter for screening for other disorder: Secondary | ICD-10-CM | POA: Diagnosis not present

## 2016-12-14 DIAGNOSIS — Z6822 Body mass index (BMI) 22.0-22.9, adult: Secondary | ICD-10-CM | POA: Diagnosis not present

## 2016-12-14 DIAGNOSIS — I4891 Unspecified atrial fibrillation: Secondary | ICD-10-CM | POA: Diagnosis not present

## 2016-12-14 DIAGNOSIS — R791 Abnormal coagulation profile: Secondary | ICD-10-CM | POA: Diagnosis not present

## 2017-01-04 DIAGNOSIS — R791 Abnormal coagulation profile: Secondary | ICD-10-CM | POA: Diagnosis not present

## 2017-01-04 DIAGNOSIS — Z6821 Body mass index (BMI) 21.0-21.9, adult: Secondary | ICD-10-CM | POA: Diagnosis not present

## 2017-01-04 DIAGNOSIS — I4891 Unspecified atrial fibrillation: Secondary | ICD-10-CM | POA: Diagnosis not present

## 2017-01-10 DIAGNOSIS — I2510 Atherosclerotic heart disease of native coronary artery without angina pectoris: Secondary | ICD-10-CM | POA: Diagnosis not present

## 2017-01-10 DIAGNOSIS — I5022 Chronic systolic (congestive) heart failure: Secondary | ICD-10-CM | POA: Diagnosis not present

## 2017-01-10 DIAGNOSIS — I480 Paroxysmal atrial fibrillation: Secondary | ICD-10-CM | POA: Diagnosis not present

## 2017-01-10 DIAGNOSIS — I420 Dilated cardiomyopathy: Secondary | ICD-10-CM | POA: Diagnosis not present

## 2017-01-10 DIAGNOSIS — Z95810 Presence of automatic (implantable) cardiac defibrillator: Secondary | ICD-10-CM | POA: Diagnosis not present

## 2017-01-18 DIAGNOSIS — Z6822 Body mass index (BMI) 22.0-22.9, adult: Secondary | ICD-10-CM | POA: Diagnosis not present

## 2017-01-18 DIAGNOSIS — E784 Other hyperlipidemia: Secondary | ICD-10-CM | POA: Diagnosis not present

## 2017-01-18 DIAGNOSIS — R791 Abnormal coagulation profile: Secondary | ICD-10-CM | POA: Diagnosis not present

## 2017-01-18 DIAGNOSIS — I10 Essential (primary) hypertension: Secondary | ICD-10-CM | POA: Diagnosis not present

## 2017-01-18 DIAGNOSIS — E119 Type 2 diabetes mellitus without complications: Secondary | ICD-10-CM | POA: Diagnosis not present

## 2017-01-18 DIAGNOSIS — I4891 Unspecified atrial fibrillation: Secondary | ICD-10-CM | POA: Diagnosis not present

## 2017-01-18 DIAGNOSIS — E063 Autoimmune thyroiditis: Secondary | ICD-10-CM | POA: Diagnosis not present

## 2017-01-31 DIAGNOSIS — L570 Actinic keratosis: Secondary | ICD-10-CM | POA: Diagnosis not present

## 2017-02-08 DIAGNOSIS — Z23 Encounter for immunization: Secondary | ICD-10-CM | POA: Diagnosis not present

## 2017-02-08 DIAGNOSIS — I4891 Unspecified atrial fibrillation: Secondary | ICD-10-CM | POA: Diagnosis not present

## 2017-02-08 DIAGNOSIS — R791 Abnormal coagulation profile: Secondary | ICD-10-CM | POA: Diagnosis not present

## 2017-03-12 DIAGNOSIS — Z7901 Long term (current) use of anticoagulants: Secondary | ICD-10-CM | POA: Diagnosis not present

## 2017-03-12 DIAGNOSIS — Z6822 Body mass index (BMI) 22.0-22.9, adult: Secondary | ICD-10-CM | POA: Diagnosis not present

## 2017-03-12 DIAGNOSIS — I4891 Unspecified atrial fibrillation: Secondary | ICD-10-CM | POA: Diagnosis not present

## 2017-03-12 DIAGNOSIS — R251 Tremor, unspecified: Secondary | ICD-10-CM | POA: Diagnosis not present

## 2017-03-14 ENCOUNTER — Encounter: Payer: Self-pay | Admitting: Neurology

## 2017-03-30 NOTE — Progress Notes (Signed)
Subjective:   Mitchell Murray was seen in consultation in the movement disorder clinic at the request of Dr. Wende Neighbors.    This patient is accompanied in the office by his spouse who supplements the history.  The evaluation is for tremor.  The records that were made available to me were reviewed.  Tremor started approximately 7-8 years ago and involves the bilateral UE and LE.  When it first started it started in the R leg and then the R hand and then the L leg and now the L arm.   Tremor is most noticeable when he tries to use a screwdriver (he is a retired Clinical biochemist).  His wife notes it when holding a songbook in church.  He doesn't note it when eating.   There is no family hx of tremor.  I was able to review records from 2016 from [facility] physicians.  He tried gabapentin.  Records report that it was helpful at only 100 mg.  The patient states that it was not helpful.  Affected by caffeine:  No. (1 cup coffee but it is decaf/day; coke occasionally) Affected by alcohol:  Doesn't drink any Affected by stress:  No. Affected by fatigue:  No. Spills soup if on spoon:  May or may not (has to put spoon in the L hand even though is R hand dominant) Spills glass of liquid if full:  May or may not Affects ADL's (tying shoes, brushing teeth, etc):  No. (does have to use both hands to shave)  Other specific symptoms: Voice: thinks he talks louder because he is losing his hearing Sleep: sleeps well  Vivid Dreams:  May or may not  Acting out dreams:  No. Wet Pillows: No. Postural symptoms:  Yes.    Falls?  No. Bradykinesia symptoms: no bradykinesia noted Loss of smell:  No. Loss of taste:  No. Urinary Incontinence:  No. Difficulty Swallowing:  No. Handwriting, micrographia: No. Depression:  No. Memory changes:  No. Hallucinations:  No.  visual distortions: No. N/V:  No. Lightheaded:  No.  Syncope: No. Diplopia:  No. Dyskinesia:  No.   Reports that he had a stroke about 6-7 years  ago "in the back of head" but has had no residual sx's .  States that he was seen at [facility] but I don't see any neuroimaging in the system.  I do see records from [facility] regional from 2016 that he had a PCA infarct in the past on the L   Outside reports reviewed: historical medical records, lab reports, office notes and referral letter/letters.  Allergies  Allergen Reactions  . Ivp Dye [Iodinated Diagnostic Agents] Anaphylaxis    Outpatient Encounter Medications as of 04/02/2017  Medication Sig  . aspirin EC 81 MG tablet Take 81 mg by mouth 3 (three) times a week. Mon, Wed, and Sunday.  Marland Kitchen atorvastatin (LIPITOR) 10 MG tablet Take 10 mg by mouth every evening.   . digoxin (LANOXIN) 0.125 MG tablet Take 0.125 mg by mouth daily.  Marland Kitchen docusate sodium (COLACE) 50 MG capsule Take 2 capsules (100 mg total) by mouth 2 (two) times daily as needed for constipation.  . gabapentin (NEURONTIN) 100 MG capsule Take 100 mg by mouth at bedtime.  Marland Kitchen levothyroxine (SYNTHROID, LEVOTHROID) 75 MCG tablet Take 75 mcg by mouth daily before breakfast.  . meclizine (ANTIVERT) 25 MG tablet Take 25 mg by mouth 2 (two) times daily.  . metFORMIN (GLUCOPHAGE) 1000 MG tablet Take 1,000 mg by mouth 2 (  two) times daily.  . metoprolol succinate (TOPROL-XL) 50 MG 24 hr tablet Take 50 mg by mouth daily. Take with or immediately following a meal.  . omeprazole (PRILOSEC) 40 MG capsule Take 40 mg by mouth daily.  Marland Kitchen spironolactone (ALDACTONE) 25 MG tablet Take 25 mg by mouth daily.  Marland Kitchen warfarin (COUMADIN) 2 MG tablet Take 2 mg by mouth every other day.  . warfarin (COUMADIN) 2.5 MG tablet Take 2.5 mg by mouth every other day.  . [DISCONTINUED] enoxaparin (LOVENOX) 100 MG/ML injection Inject 1 mL (100 mg total) into the skin daily. Resume in 48 hours if bleeding in catheter has cleared. (Patient not taking: Reported on 06/12/2014)  . [DISCONTINUED] feeding supplement, GLUCERNA SHAKE, (GLUCERNA SHAKE) LIQD Take 237 mLs by mouth 2 (two)  times daily between meals.  . [DISCONTINUED] furosemide (LASIX) 40 MG tablet Take 1 tablet (40 mg total) by mouth daily.  . [DISCONTINUED] losartan (COZAAR) 50 MG tablet Take 50 mg by mouth daily.  . [DISCONTINUED] meloxicam (MOBIC) 7.5 MG tablet Take 7.5 mg by mouth daily.  . [DISCONTINUED] oxyCODONE (ROXICODONE) 5 MG immediate release tablet Take 1 tablet (5 mg total) by mouth every 4 (four) hours as needed for pain. (Patient not taking: Reported on 06/12/2014)   No facility-administered encounter medications on file as of 04/02/2017.     Past Medical History:  Diagnosis Date  . Automatic implantable cardioverter-defibrillator in situ   . Cardiomegaly   . CHF (congestive heart failure) ([place])   . Coronary artery disease   . Dysrhythmia    atrial fib  . GERD (gastroesophageal reflux disease)   . Hard of hearing   . Hyperlipidemia   . Hypertension   . Hypothyroidism   . Kidney stones   . MI (myocardial infarction) ([place])   . Pacemaker   . Prostate cancer [facility])     Past Surgical History:  Procedure Laterality Date  . CARDIAC DEFIBRILLATOR PLACEMENT    . CYSTOSCOPY WITH LITHOLAPAXY N/A 01/24/2013   Procedure: CYSTOSCOPY WITH LITHOLAPAXY;  Surgeon: Ardis Hughs, MD;  Location: [facility];  Service: Urology;  Laterality: N/A;  . HERNIA REPAIR    . HOLMIUM LASER APPLICATION N/A 7/67/3419   Procedure: HOLMIUM LASER APPLICATION;  Surgeon: Ardis Hughs, MD;  Location: [facility];  Service: Urology;  Laterality: N/A;  . PACEMAKER INSERTION    . PROSTATECTOMY      Social History   Socioeconomic History  . Marital status: Married    Spouse name: Not on file  . Number of children: Not on file  . Years of education: Not on file  . Highest education level: Not on file  Social Needs  . Financial resource strain: Not on file  . Food insecurity - worry: Not on file  . Food insecurity - inability: Not on file  . Transportation needs - medical: Not on file  . Transportation needs -  non-medical: Not on file  Occupational History  . Not on file  Tobacco Use  . Smoking status: Never Smoker  . Smokeless tobacco: Never Used  Substance and Sexual Activity  . Alcohol use: No  . Drug use: No  . Sexual activity: No  Other Topics Concern  . Not on file  Social History Narrative  . Not on file    Family Status  Relation Name Status  . Mother  Deceased  . Father  Deceased  . Sister 3 Deceased  . Brother  Deceased    Review of Systems Some  DOE.  A complete 10 system ROS was obtained and was negative apart from what is mentioned.   Objective:   VITALS:   Vitals:   04/02/17 0917  BP: 138/66  Pulse: 74  SpO2: 98%  Weight: 157 lb (71.2 kg)  Height: 5\' 10"  (1.778 m)   Gen:  Appears stated age and in NAD. HEENT:  Normocephalic, atraumatic. The mucous membranes are moist. The superficial temporal arteries are without ropiness or tenderness. Cardiovascular: Regular rate and rhythm. Lungs: Clear to auscultation bilaterally. Neck: There are no carotid bruits noted bilaterally.  NEUROLOGICAL:  Orientation:  The patient is alert and oriented x 3.  Recent and remote memory are intact.  Attention span and concentration are normal.  Able to name objects and repeat without trouble.  Fund of knowledge is appropriate Cranial nerves: There is good facial symmetry. The pupils are equal round and reactive to light bilaterally. Fundoscopic exam reveals clear disc margins bilaterally. Extraocular muscles are intact and visual fields are full to confrontational testing. Speech is fluent and clear. Soft palate rises symmetrically and there is no tongue deviation. Hearing is intact to conversational tone (has in hearing aids). Tone: Tone is good throughout. Sensation: Sensation is intact to light touch and pinprick throughout (facial, trunk, extremities). Vibration is intact at the bilateral big toe but it is decreased. There is no extinction with double simultaneous stimulation.  There is no sensory dermatomal level identified. Coordination:  The patient has no real decremation with rapid alternating movements, but tremor is so severe on the right arm and hand that he has trouble performing rapid alternating movements there.  He does better in the left hand and feet. Motor: Strength is 5/5 in the bilateral upper and lower extremities.  Shoulder shrug is equal bilaterally.  There is no pronator drift.  There are no fasciculations noted. DTR's: Deep tendon reflexes are 2/4 at the bilateral biceps, triceps, brachioradialis, patella and achilles.  Plantar responses are downgoing bilaterally. Gait and Station: The patient is able to ambulate without difficulty. The patient is able to heel toe walk without any difficulty.  There is a right upper extremity resting tremor with ambulation.  The patient is able to ambulate in a tandem fashion. The patient is able to stand in the Romberg position.   MOVEMENT EXAM: Tremor:  There is RUE rest tremor that greatly increases with distraction.  There is severe intention tremor on the right and less so on the L.  There is chin tremor that is mild.  He has significant trouble with Archimedes spirals and cannot get the right hand even on the tablet to attempt to draw the spirals.  Labs:  The most recent lab work that I have from the patient was dated October 16, 2016.  His hemoglobin A1c was 6.0.  His INR on that date was elevated at 4.2.  I queried care everywhere and there was not a more recent INR.  His goal INR is 2-3.  Sodium was 141, potassium 4.3, chloride 103, CO2 23, BUN 15, creatinine 0.  98, TSH was low at 0.027.  White blood cells were 7.0, hemoglobin 13.2, hematocrit 40.2, platelets 252.     Assessment/Plan:   1.  Tremor.  -This is likely very severe essential tremor, that has now developed a rest tremor because of severity.   We discussed medication therapy as well as surgical therapy.  I do not think that medication therapy is going  to help him significantly given the very severe  nature of this.  I talked to him about this in detail.  I told him that we could try primidone, but that I am not sure he would help significantly, and that it interacts with his Coumadin.  We could use it, but he would need to have his INR checked more frequently.  The biggest issue is that I do not think it will be helpful for this degree of tremor.  I think the only thing that would be helpful would be surgical intervention.  However, he has a number of different medical problems and he would need cardiac clearance.  He is on Coumadin and would need to be off for that.  This certainly provides increased risk around the perioperative timeframe.  In addition, he is diabetic which provides increased risk.  He and I talked about all of these risks in detail and his wife was present.  They asked multiple questions.  The patient is very much in favor of surgical intervention, but I told him to go home and think about it.  He certainly would need cardiac clearance.  If we end up proceeding, I would favor left VIM only given that he is fairly high risk.  In the meantime, we decided to go ahead and get a DaTscan given the strong resting component, although I think that this is likely just present because of the severity of the essential tremor.  He was shown patient videos that were HIPAA compliant of patients that were pre-and postop.  -The patient does have a history of hypothyroidism.  His last TSH that I have was quite low, however, which could exacerbate tremor.  This is being followed by his primary care physician.  I doubt that is significantly affecting his tremor today.  -will schedule the DaT scan.  If negative, will talk about neurocognitive testing.    2.  Peripheral neuropathy, likely due to DM  -The patient has clinical examination evidence of a diffuse peripheral neuropathy, which certainly can affect gait and balance.  We discussed safety associated with  peripheral neuropathy.    3.  We will call him following the DaT scan and decide how he would like to proceed.  Much greater than 50% of this visit was spent in counseling and coordinating care.  Total face to face time:  60 min   CC:  Ocie Doyne., MD

## 2017-04-02 ENCOUNTER — Encounter: Payer: Self-pay | Admitting: Neurology

## 2017-04-02 ENCOUNTER — Ambulatory Visit: Payer: Medicare Other | Admitting: Neurology

## 2017-04-02 VITALS — BP 138/66 | HR 74 | Ht 70.0 in | Wt 157.0 lb

## 2017-04-02 DIAGNOSIS — R251 Tremor, unspecified: Secondary | ICD-10-CM

## 2017-04-02 NOTE — Patient Instructions (Signed)
1. We have sent a referral to [facility] for your DAT scan and they will call you directly to schedule your appt.. They are located at [address] If you need to contact them directly please call [phone].  

## 2017-04-11 DIAGNOSIS — R791 Abnormal coagulation profile: Secondary | ICD-10-CM | POA: Diagnosis not present

## 2017-04-11 DIAGNOSIS — M25552 Pain in left hip: Secondary | ICD-10-CM | POA: Diagnosis not present

## 2017-04-11 DIAGNOSIS — M25559 Pain in unspecified hip: Secondary | ICD-10-CM | POA: Diagnosis not present

## 2017-04-11 DIAGNOSIS — I4891 Unspecified atrial fibrillation: Secondary | ICD-10-CM | POA: Diagnosis not present

## 2017-04-11 DIAGNOSIS — Z6823 Body mass index (BMI) 23.0-23.9, adult: Secondary | ICD-10-CM | POA: Diagnosis not present

## 2017-04-11 DIAGNOSIS — Z87442 Personal history of urinary calculi: Secondary | ICD-10-CM | POA: Diagnosis not present

## 2017-05-02 ENCOUNTER — Telehealth: Payer: Self-pay | Admitting: Neurology

## 2017-05-02 NOTE — Telephone Encounter (Signed)
Melanie from pre service left a voicemail message asking for a call back regarding pre cert for Pt's MRI CB# [phone]

## 2017-05-03 ENCOUNTER — Encounter (HOSPITAL_COMMUNITY): Payer: Medicare Other

## 2017-05-03 ENCOUNTER — Other Ambulatory Visit (HOSPITAL_COMMUNITY): Payer: Medicare Other

## 2017-05-17 DIAGNOSIS — Z6822 Body mass index (BMI) 22.0-22.9, adult: Secondary | ICD-10-CM | POA: Diagnosis not present

## 2017-05-17 DIAGNOSIS — I4891 Unspecified atrial fibrillation: Secondary | ICD-10-CM | POA: Diagnosis not present

## 2017-05-17 DIAGNOSIS — Z7901 Long term (current) use of anticoagulants: Secondary | ICD-10-CM | POA: Diagnosis not present

## 2017-05-17 DIAGNOSIS — L089 Local infection of the skin and subcutaneous tissue, unspecified: Secondary | ICD-10-CM | POA: Diagnosis not present

## 2017-05-24 ENCOUNTER — Encounter (HOSPITAL_COMMUNITY)
Admission: RE | Admit: 2017-05-24 | Discharge: 2017-05-24 | Disposition: A | Discharge status: Home / Self Care | Payer: Medicare Other | Source: Ambulatory Visit | Attending: Neurology | Admitting: Neurology

## 2017-05-24 DIAGNOSIS — R251 Tremor, unspecified: Secondary | ICD-10-CM | POA: Diagnosis not present

## 2017-05-24 MED ORDER — IOFLUPANE I 123 185 MBQ/2.5ML IV SOLN
4.9000 | Freq: Once | INTRAVENOUS | Status: AC
  Administered 2017-05-24: 4.9 via INTRAVENOUS

## 2017-05-24 MED ORDER — IODINE STRONG (LUGOLS) 5 % PO SOLN
0.8000 mL | Freq: Once | ORAL | Status: AC
  Administered 2017-05-24: 0.8 mL via ORAL
  Filled 2017-05-24: qty 0.8

## 2017-05-25 ENCOUNTER — Telehealth: Payer: Self-pay | Admitting: Neurology

## 2017-05-25 DIAGNOSIS — R251 Tremor, unspecified: Secondary | ICD-10-CM

## 2017-05-25 NOTE — Telephone Encounter (Signed)
-----   Message from [place], DO sent at 05/24/2017  5:18 PM EST ----- Reviewed.  Let pt know that DaT is normal, as we suspected.  No evidence of PD and this is just severe ET.  Has he thought about how he would like to proceed, if at all?

## 2017-05-25 NOTE — Telephone Encounter (Signed)
Left message on machine for patient to call back.

## 2017-05-29 NOTE — Telephone Encounter (Signed)
Pt returned call. Advised him no evidence of Parkinsons and that is essential tremor. Pt wishes to proceed with a therapy to reduce tremors/shaking. He request to call back on call# [phone], and if/when calling home number to leave message, please speak slow and loud, as he is hard of hearing.

## 2017-05-29 NOTE — Telephone Encounter (Signed)
Next step would be neurocognitive testing to see if memory is prepared to handle that type of surgery.  Please let him know and schedule if agreeable.

## 2017-05-30 NOTE — Telephone Encounter (Signed)
Patient made aware.   Note sent to front desk to schedule patient.

## 2017-05-30 NOTE — Addendum Note (Signed)
Addended byAnnamaria Helling on: 05/30/2017 11:28 AM   Modules accepted: Orders

## 2017-06-04 ENCOUNTER — Telehealth: Payer: Self-pay | Admitting: Neurology

## 2017-06-04 NOTE — Telephone Encounter (Signed)
I'm not sure what to what he is referring.  As far as I know (could be wrong), he wasn't seen for this at [facility] but I also know that they usually do this testing in this age group.  Regardless, I am happy to help him get another opinion if he is angry about this

## 2017-06-04 NOTE — Telephone Encounter (Signed)
Patient wants to know why Dr Tat wants patient to have something done. He is not sure why or what

## 2017-06-04 NOTE — Telephone Encounter (Signed)
Spoke with patient. He is confused about why he needs neurocognitive testing prior to DBS surgery. He states that he did not have to go through this at [facility] and doesn't understand. He asked to speak to Dr. Carles Collet directly.   I explained to him that Neurocognitive testing is important to make sure his memory can handle the stress of brain surgery. I offered to make an appt for him to sit down and discuss with Dr. Carles Collet.   He states he may get a second opinion and will let us know if he would like to proceed.   Dr. Carles Collet Juluis Rainier.

## 2017-06-07 DIAGNOSIS — Z7901 Long term (current) use of anticoagulants: Secondary | ICD-10-CM | POA: Diagnosis not present

## 2017-06-07 DIAGNOSIS — I4891 Unspecified atrial fibrillation: Secondary | ICD-10-CM | POA: Diagnosis not present

## 2017-06-18 ENCOUNTER — Telehealth: Payer: Self-pay | Admitting: Neurology

## 2017-06-18 NOTE — Telephone Encounter (Signed)
A staffe message was sent to [place] on 05-30-17 to have [place] sch patient with [place]. Patient wants to talk to you about why he needs this appt please call

## 2017-06-18 NOTE — Telephone Encounter (Signed)
Spoke with patient. He has decided to get another opinion. We don't need to contact him about scheduling appts with Dr. Si Raider. He will call us back if he changes his mind. Rejeana Brock.

## 2017-06-18 NOTE — Telephone Encounter (Signed)
Tried to call patient back but could not hear him (phone issues). He will call back.

## 2017-07-10 DIAGNOSIS — Z7901 Long term (current) use of anticoagulants: Secondary | ICD-10-CM | POA: Diagnosis not present

## 2017-07-10 DIAGNOSIS — I4891 Unspecified atrial fibrillation: Secondary | ICD-10-CM | POA: Diagnosis not present

## 2017-07-10 DIAGNOSIS — E119 Type 2 diabetes mellitus without complications: Secondary | ICD-10-CM | POA: Diagnosis not present

## 2017-07-10 DIAGNOSIS — I420 Dilated cardiomyopathy: Secondary | ICD-10-CM | POA: Diagnosis not present

## 2017-07-10 DIAGNOSIS — E063 Autoimmune thyroiditis: Secondary | ICD-10-CM | POA: Diagnosis not present

## 2017-07-10 DIAGNOSIS — E785 Hyperlipidemia, unspecified: Secondary | ICD-10-CM | POA: Diagnosis not present

## 2017-07-10 DIAGNOSIS — I10 Essential (primary) hypertension: Secondary | ICD-10-CM | POA: Diagnosis not present

## 2017-07-10 DIAGNOSIS — J301 Allergic rhinitis due to pollen: Secondary | ICD-10-CM | POA: Diagnosis not present

## 2017-07-24 DIAGNOSIS — Z7901 Long term (current) use of anticoagulants: Secondary | ICD-10-CM | POA: Diagnosis not present

## 2017-07-24 DIAGNOSIS — I4891 Unspecified atrial fibrillation: Secondary | ICD-10-CM | POA: Diagnosis not present

## 2017-07-24 DIAGNOSIS — Z6822 Body mass index (BMI) 22.0-22.9, adult: Secondary | ICD-10-CM | POA: Diagnosis not present

## 2017-07-26 DIAGNOSIS — Z4502 Encounter for adjustment and management of automatic implantable cardiac defibrillator: Secondary | ICD-10-CM | POA: Diagnosis not present

## 2017-07-26 DIAGNOSIS — I420 Dilated cardiomyopathy: Secondary | ICD-10-CM | POA: Diagnosis not present

## 2017-07-27 DIAGNOSIS — I420 Dilated cardiomyopathy: Secondary | ICD-10-CM | POA: Diagnosis not present

## 2017-07-27 DIAGNOSIS — I480 Paroxysmal atrial fibrillation: Secondary | ICD-10-CM | POA: Diagnosis not present

## 2017-07-27 DIAGNOSIS — I110 Hypertensive heart disease with heart failure: Secondary | ICD-10-CM | POA: Diagnosis not present

## 2017-07-27 DIAGNOSIS — I2510 Atherosclerotic heart disease of native coronary artery without angina pectoris: Secondary | ICD-10-CM | POA: Diagnosis not present

## 2017-07-27 DIAGNOSIS — I255 Ischemic cardiomyopathy: Secondary | ICD-10-CM | POA: Diagnosis not present

## 2017-08-25 ENCOUNTER — Encounter (HOSPITAL_COMMUNITY): Payer: Self-pay | Admitting: Emergency Medicine

## 2017-08-25 ENCOUNTER — Emergency Department (HOSPITAL_COMMUNITY)
Admission: EM | Admit: 2017-08-25 | Discharge: 2017-08-26 | Disposition: A | Discharge status: Home / Self Care | Payer: Medicare Other | Attending: Emergency Medicine | Admitting: Emergency Medicine

## 2017-08-25 ENCOUNTER — Emergency Department (HOSPITAL_COMMUNITY): Payer: Medicare Other

## 2017-08-25 DIAGNOSIS — Z7984 Long term (current) use of oral hypoglycemic drugs: Secondary | ICD-10-CM | POA: Insufficient documentation

## 2017-08-25 DIAGNOSIS — I2510 Atherosclerotic heart disease of native coronary artery without angina pectoris: Secondary | ICD-10-CM | POA: Diagnosis not present

## 2017-08-25 DIAGNOSIS — E039 Hypothyroidism, unspecified: Secondary | ICD-10-CM | POA: Diagnosis not present

## 2017-08-25 DIAGNOSIS — Z95810 Presence of automatic (implantable) cardiac defibrillator: Secondary | ICD-10-CM | POA: Diagnosis not present

## 2017-08-25 DIAGNOSIS — I5022 Chronic systolic (congestive) heart failure: Secondary | ICD-10-CM | POA: Diagnosis not present

## 2017-08-25 DIAGNOSIS — E119 Type 2 diabetes mellitus without complications: Secondary | ICD-10-CM | POA: Diagnosis not present

## 2017-08-25 DIAGNOSIS — Z7901 Long term (current) use of anticoagulants: Secondary | ICD-10-CM | POA: Diagnosis not present

## 2017-08-25 DIAGNOSIS — N23 Unspecified renal colic: Secondary | ICD-10-CM | POA: Insufficient documentation

## 2017-08-25 DIAGNOSIS — I252 Old myocardial infarction: Secondary | ICD-10-CM | POA: Diagnosis not present

## 2017-08-25 DIAGNOSIS — Z79899 Other long term (current) drug therapy: Secondary | ICD-10-CM | POA: Insufficient documentation

## 2017-08-25 DIAGNOSIS — R1032 Left lower quadrant pain: Secondary | ICD-10-CM | POA: Diagnosis not present

## 2017-08-25 DIAGNOSIS — Z8546 Personal history of malignant neoplasm of prostate: Secondary | ICD-10-CM | POA: Insufficient documentation

## 2017-08-25 DIAGNOSIS — I110 Hypertensive heart disease with heart failure: Secondary | ICD-10-CM | POA: Diagnosis not present

## 2017-08-25 DIAGNOSIS — R109 Unspecified abdominal pain: Secondary | ICD-10-CM | POA: Diagnosis not present

## 2017-08-25 LAB — CBC
HCT: 40.4 % (ref 39.0–52.0)
Hemoglobin: 13.7 g/dL (ref 13.0–17.0)
MCH: 30.6 pg (ref 26.0–34.0)
MCHC: 33.9 g/dL (ref 30.0–36.0)
MCV: 90.4 fL (ref 78.0–100.0)
PLATELETS: 281 10*3/uL (ref 150–400)
RBC: 4.47 MIL/uL (ref 4.22–5.81)
RDW: 12.5 % (ref 11.5–15.5)
WBC: 11.9 10*3/uL — AB (ref 4.0–10.5)

## 2017-08-25 LAB — BASIC METABOLIC PANEL
ANION GAP: 16 — AB (ref 5–15)
BUN: 26 mg/dL — AB (ref 6–20)
CALCIUM: 10.2 mg/dL (ref 8.9–10.3)
CO2: 18 mmol/L — AB (ref 22–32)
Chloride: 107 mmol/L (ref 101–111)
Creatinine, Ser: 1.27 mg/dL — ABNORMAL HIGH (ref 0.61–1.24)
GFR calc Af Amer: >60 mL/min (ref >60)
GFR, EST NON AFRICAN AMERICAN: 53 mL/min — AB (ref >60)
Glucose, Bld: 225 mg/dL — ABNORMAL HIGH (ref 65–99)
POTASSIUM: 4.7 mmol/L (ref 3.5–5.1)
Sodium: 141 mmol/L (ref 135–145)

## 2017-08-25 MED ORDER — DICYCLOMINE HCL 10 MG/ML IM SOLN
20.0000 mg | Freq: Once | INTRAMUSCULAR | Status: AC
  Administered 2017-08-25: 20 mg via INTRAMUSCULAR
  Filled 2017-08-25: qty 2

## 2017-08-25 MED ORDER — FENTANYL CITRATE (PF) 100 MCG/2ML IJ SOLN
50.0000 ug | INTRAMUSCULAR | Status: DC | PRN
  Administered 2017-08-25: 50 ug via INTRAVENOUS
  Filled 2017-08-25: qty 2

## 2017-08-25 MED ORDER — KETOROLAC TROMETHAMINE 30 MG/ML IJ SOLN
15.0000 mg | Freq: Once | INTRAMUSCULAR | Status: AC
  Administered 2017-08-25: 15 mg via INTRAVENOUS
  Filled 2017-08-25: qty 1

## 2017-08-25 NOTE — ED Notes (Signed)
Pt made aware UA is needed. Urinal left at bedside. Not able to void. Will check back later

## 2017-08-25 NOTE — ED Provider Notes (Signed)
[facility] DEPT Provider Note   CSN: [id number] Arrival date & time: 08/25/17  2151     History   Chief Complaint Chief Complaint  Patient presents with  . Flank Pain    HPI Mitchell Murray is a 77 y.o. male.  The history is provided by the patient.  Flank Pain  This is a recurrent problem. The current episode started 3 to 5 hours ago. The problem occurs constantly. The problem has not changed since onset.Pertinent negatives include no chest pain, no abdominal pain, no headaches and no shortness of breath. Nothing aggravates the symptoms. Nothing relieves the symptoms. He has tried nothing for the symptoms. The treatment provided no relief.  Has had 5 kidney stones in the past and started having left flank pain.  Denies vomiting and hematuria.  No other symptoms.    Past Medical History:  Diagnosis Date  . Automatic implantable cardioverter-defibrillator in situ   . Cardiomegaly   . CHF (congestive heart failure) ([place])   . Coronary artery disease   . Dysrhythmia    atrial fib  . GERD (gastroesophageal reflux disease)   . Hard of hearing   . Hyperlipidemia   . Hypertension   . Hypothyroidism   . Kidney stones   . MI (myocardial infarction) ([place])   . Pacemaker   . Prostate cancer [facility])     Patient Active Problem List   Diagnosis Date Noted  . Chronic systolic congestive heart failure ([place])   . DM (diabetes mellitus) ([place]) 06/14/2014  . Orthostatic hypotension   . Weakness generalized 06/13/2014  . Elevated troponin 06/12/2014  . Essential hypertension 06/12/2014  . HLD (hyperlipidemia) 06/12/2014  . GERD (gastroesophageal reflux disease) 06/12/2014  . Hypothyroidism 12/03/2012  . Hypokalemia 12/03/2012  . Hyperglycemia 12/03/2012  . Right flank pain 12/02/2012  . CAD (coronary artery disease) 12/02/2012  . AICD (automatic cardioverter/defibrillator) present 12/02/2012  . A-fib ([place]) 12/02/2012  . Prostate cancer ([place])  12/02/2012  . Pyelonephritis 12/02/2012    Past Surgical History:  Procedure Laterality Date  . CARDIAC DEFIBRILLATOR PLACEMENT    . CYSTOSCOPY WITH LITHOLAPAXY N/A 01/24/2013   Procedure: CYSTOSCOPY WITH LITHOLAPAXY;  Surgeon: Ardis Hughs, MD;  Location: [facility];  Service: Urology;  Laterality: N/A;  . HERNIA REPAIR    . HOLMIUM LASER APPLICATION N/A 10/05/3708   Procedure: HOLMIUM LASER APPLICATION;  Surgeon: Ardis Hughs, MD;  Location: [facility];  Service: Urology;  Laterality: N/A;  . PACEMAKER INSERTION    . PROSTATECTOMY          Home Medications    Prior to Admission medications   Medication Sig Start Date End Date Taking? Authorizing Provider  atorvastatin (LIPITOR) 10 MG tablet Take 10 mg by mouth every evening.    Yes [provider]  digoxin (LANOXIN) 0.125 MG tablet Take 0.125 mg by mouth daily.   Yes [provider]  gabapentin (NEURONTIN) 100 MG capsule Take 100 mg by mouth at bedtime.   Yes [provider]  levothyroxine (SYNTHROID, LEVOTHROID) 75 MCG tablet Take 75 mcg by mouth daily before breakfast.   Yes [provider]  meclizine (ANTIVERT) 25 MG tablet Take 12.5 mg by mouth 3 (three) times daily as needed for dizziness.    Yes [provider]  metFORMIN (GLUCOPHAGE) 1000 MG tablet Take 1,000 mg by mouth 2 (two) times daily.   Yes [provider]  metoprolol succinate (TOPROL-XL) 50 MG 24 hr tablet Take 75 mg by  mouth daily. Take with or immediately following a meal.    Yes [provider]  omeprazole (PRILOSEC) 40 MG capsule Take 40 mg by mouth daily.   Yes [provider]  spironolactone (ALDACTONE) 25 MG tablet Take 25 mg by mouth daily. 03/26/17  Yes [provider]  warfarin (COUMADIN) 2.5 MG tablet Take 2.5-5 mg by mouth See admin instructions. Take 2.5 mg daily except Monday and Wednesday take 5 mg   Yes [provider]  docusate sodium (COLACE) 50 MG capsule  Take 2 capsules (100 mg total) by mouth 2 (two) times daily as needed for constipation. Patient not taking: Reported on 08/25/2017 01/24/13   Ardis Hughs, MD    Family History Family History  Problem Relation Age of Onset  . Heart failure Mother   . Cancer Father   . Heart failure Sister     Social History Social History   Tobacco Use  . Smoking status: Never Smoker  . Smokeless tobacco: Never Used  Substance Use Topics  . Alcohol use: No  . Drug use: No     Allergies   Ivp dye [iodinated diagnostic agents] and Metrizamide   Review of Systems Review of Systems  Constitutional: Negative for fever.  Respiratory: Negative for shortness of breath.   Cardiovascular: Negative for chest pain.  Gastrointestinal: Negative for abdominal pain and vomiting.  Genitourinary: Positive for flank pain. Negative for dysuria and hematuria.  Neurological: Negative for headaches.  All other systems reviewed and are negative.    Physical Exam Updated Vital Signs BP (!) 169/98   Pulse 92   Temp 97.6 F (36.4 C) (Oral)   Resp (!) 21   SpO2 100%   Physical Exam  Constitutional: He is oriented to person, place, and time. He appears well-developed and well-nourished. No distress.  HENT:  Head: Normocephalic and atraumatic.  Mouth/Throat: No oropharyngeal exudate.  Eyes: Pupils are equal, round, and reactive to light. Conjunctivae are normal.  Neck: Normal range of motion. Neck supple.  Cardiovascular: Normal rate, regular rhythm, normal heart sounds and intact distal pulses.  Pulmonary/Chest: Effort normal and breath sounds normal. No respiratory distress.  Abdominal: Soft. Bowel sounds are normal. He exhibits no mass. There is no tenderness. There is no rebound and no guarding.  Musculoskeletal: Normal range of motion.  Neurological: He is alert and oriented to person, place, and time.  Skin: Skin is warm and dry. Capillary refill takes less than 2 seconds.  Psychiatric: He  has a normal mood and affect.     ED Treatments / Results  Labs (all labs ordered are listed, but only abnormal results are displayed) Labs Reviewed  URINALYSIS, [place] PANEL  CBC  PROTIME-INR    EKG None  Radiology No results found.  Procedures Procedures (including critical care time)  Medications Ordered in ED Medications  fentaNYL (SUBLIMAZE) injection 50 mcg (50 mcg Intravenous Given 08/25/17 2252)  dicyclomine (BENTYL) injection 20 mg (20 mg Intramuscular Given 08/25/17 2345)  ketorolac (TORADOL) 30 MG/ML injection 15 mg (15 mg Intravenous Given 08/25/17 2344)  tamsulosin (FLOMAX) capsule 0.4 mg (0.4 mg Oral Given 08/26/17 0110)  oxyCODONE-acetaminophen (PERCOCET/ROXICET) 5-325 MG per tablet 1 tablet (1 tablet Oral Given 08/26/17 0122)     Final Clinical Impressions(s) / ED Diagnoses  Markedly improved post medication.  RX for flomax and percocet and zofran.  Strain all urine follow up with Dr. Jeffie Pollock (patient's urologist)  for ongoing care.  Return for  weakness, numbness, changes in vision or speech, fevers >100.4 unrelieved by medication, shortness of breath, intractable vomiting, or diarrhea, abdominal pain, Inability to tolerate liquids or food, cough, altered mental status or any concerns. No signs of systemic illness or infection. The patient is nontoxic-appearing on exam and vital signs are within normal limits.   I have reviewed the triage vital signs and the nursing notes. Pertinent labs &imaging results that were available during my care of the patient were reviewed by me and considered in my medical decision making (see chart for details).  After history, exam, and medical workup I feel the patient has been appropriately medically screened and is safe for discharge home. Pertinent diagnoses were discussed with the patient. Patient was given return precautions.   Schylar Allard, MD 08/26/17 [phone]

## 2017-08-25 NOTE — ED Triage Notes (Signed)
Patient c/o left flank pain x2 hours. Hx kidney stones.

## 2017-08-26 LAB — PROTIME-INR
INR: 2.35
PROTHROMBIN TIME: 25.6 s — AB (ref 11.4–15.2)

## 2017-08-26 MED ORDER — ONDANSETRON HCL 8 MG PO TABS: 8.0000 mg | ORAL_TABLET | Freq: Two times a day (BID) | ORAL | 0 refills | Status: DC | PRN

## 2017-08-26 MED ORDER — TAMSULOSIN HCL 0.4 MG PO CAPS: 0.4000 mg | ORAL_CAPSULE | Freq: Every day | ORAL | 0 refills | Status: DC

## 2017-08-26 MED ORDER — TAMSULOSIN HCL 0.4 MG PO CAPS
0.4000 mg | ORAL_CAPSULE | ORAL | Status: AC
  Administered 2017-08-26: 0.4 mg via ORAL
  Filled 2017-08-26: qty 1

## 2017-08-26 MED ORDER — OXYCODONE-ACETAMINOPHEN 5-325 MG PO TABS: 1.0000 | ORAL_TABLET | Freq: Four times a day (QID) | ORAL | 0 refills | Status: DC | PRN

## 2017-08-26 MED ORDER — OXYCODONE-ACETAMINOPHEN 5-325 MG PO TABS
1.0000 | ORAL_TABLET | Freq: Once | ORAL | Status: AC
Start: 2017-08-26 — End: 2017-08-26
  Administered 2017-08-26: 1 via ORAL
  Filled 2017-08-26: qty 1

## 2017-08-26 NOTE — ED Notes (Signed)
Pt states that he is unable to void at all. Will check back

## 2017-08-27 DIAGNOSIS — N200 Calculus of kidney: Secondary | ICD-10-CM | POA: Diagnosis not present

## 2017-08-27 DIAGNOSIS — Z6822 Body mass index (BMI) 22.0-22.9, adult: Secondary | ICD-10-CM | POA: Diagnosis not present

## 2017-08-27 DIAGNOSIS — Z7901 Long term (current) use of anticoagulants: Secondary | ICD-10-CM | POA: Diagnosis not present

## 2017-08-27 DIAGNOSIS — I4891 Unspecified atrial fibrillation: Secondary | ICD-10-CM | POA: Diagnosis not present

## 2017-09-10 DIAGNOSIS — J189 Pneumonia, unspecified organism: Secondary | ICD-10-CM | POA: Diagnosis not present

## 2017-09-20 DIAGNOSIS — Z6821 Body mass index (BMI) 21.0-21.9, adult: Secondary | ICD-10-CM | POA: Diagnosis not present

## 2017-09-20 DIAGNOSIS — R791 Abnormal coagulation profile: Secondary | ICD-10-CM | POA: Diagnosis not present

## 2017-09-20 DIAGNOSIS — I4891 Unspecified atrial fibrillation: Secondary | ICD-10-CM | POA: Diagnosis not present

## 2017-09-20 DIAGNOSIS — R05 Cough: Secondary | ICD-10-CM | POA: Diagnosis not present

## 2017-09-26 DIAGNOSIS — I4891 Unspecified atrial fibrillation: Secondary | ICD-10-CM | POA: Diagnosis not present

## 2017-09-26 DIAGNOSIS — R791 Abnormal coagulation profile: Secondary | ICD-10-CM | POA: Diagnosis not present

## 2017-09-26 DIAGNOSIS — Z6821 Body mass index (BMI) 21.0-21.9, adult: Secondary | ICD-10-CM | POA: Diagnosis not present

## 2017-10-03 ENCOUNTER — Encounter: Payer: Self-pay | Admitting: Psychology

## 2017-10-03 DIAGNOSIS — I4891 Unspecified atrial fibrillation: Secondary | ICD-10-CM | POA: Diagnosis not present

## 2017-10-03 DIAGNOSIS — S30860A Insect bite (nonvenomous) of lower back and pelvis, initial encounter: Secondary | ICD-10-CM | POA: Diagnosis not present

## 2017-10-03 DIAGNOSIS — R791 Abnormal coagulation profile: Secondary | ICD-10-CM | POA: Diagnosis not present

## 2017-10-03 DIAGNOSIS — Z6821 Body mass index (BMI) 21.0-21.9, adult: Secondary | ICD-10-CM | POA: Diagnosis not present

## 2017-10-17 DIAGNOSIS — D1801 Hemangioma of skin and subcutaneous tissue: Secondary | ICD-10-CM | POA: Diagnosis not present

## 2017-10-17 DIAGNOSIS — C44529 Squamous cell carcinoma of skin of other part of trunk: Secondary | ICD-10-CM | POA: Diagnosis not present

## 2017-10-17 DIAGNOSIS — Z6821 Body mass index (BMI) 21.0-21.9, adult: Secondary | ICD-10-CM | POA: Diagnosis not present

## 2017-10-17 DIAGNOSIS — L570 Actinic keratosis: Secondary | ICD-10-CM | POA: Diagnosis not present

## 2017-10-17 DIAGNOSIS — L821 Other seborrheic keratosis: Secondary | ICD-10-CM | POA: Diagnosis not present

## 2017-10-17 DIAGNOSIS — L578 Other skin changes due to chronic exposure to nonionizing radiation: Secondary | ICD-10-CM | POA: Diagnosis not present

## 2017-10-17 DIAGNOSIS — Z7901 Long term (current) use of anticoagulants: Secondary | ICD-10-CM | POA: Diagnosis not present

## 2017-10-17 DIAGNOSIS — I4891 Unspecified atrial fibrillation: Secondary | ICD-10-CM | POA: Diagnosis not present

## 2017-11-07 DIAGNOSIS — R791 Abnormal coagulation profile: Secondary | ICD-10-CM | POA: Diagnosis not present

## 2017-11-07 DIAGNOSIS — Z1339 Encounter for screening examination for other mental health and behavioral disorders: Secondary | ICD-10-CM | POA: Diagnosis not present

## 2017-11-07 DIAGNOSIS — I4891 Unspecified atrial fibrillation: Secondary | ICD-10-CM | POA: Diagnosis not present

## 2017-11-07 DIAGNOSIS — Z6821 Body mass index (BMI) 21.0-21.9, adult: Secondary | ICD-10-CM | POA: Diagnosis not present

## 2017-11-07 DIAGNOSIS — Z1331 Encounter for screening for depression: Secondary | ICD-10-CM | POA: Diagnosis not present

## 2017-11-09 DIAGNOSIS — L820 Inflamed seborrheic keratosis: Secondary | ICD-10-CM | POA: Diagnosis not present

## 2017-11-09 DIAGNOSIS — L570 Actinic keratosis: Secondary | ICD-10-CM | POA: Diagnosis not present

## 2017-11-09 DIAGNOSIS — C44529 Squamous cell carcinoma of skin of other part of trunk: Secondary | ICD-10-CM | POA: Diagnosis not present

## 2017-12-13 DIAGNOSIS — Z6821 Body mass index (BMI) 21.0-21.9, adult: Secondary | ICD-10-CM | POA: Diagnosis not present

## 2017-12-13 DIAGNOSIS — I4891 Unspecified atrial fibrillation: Secondary | ICD-10-CM | POA: Diagnosis not present

## 2017-12-13 DIAGNOSIS — R791 Abnormal coagulation profile: Secondary | ICD-10-CM | POA: Diagnosis not present

## 2017-12-13 DIAGNOSIS — M1990 Unspecified osteoarthritis, unspecified site: Secondary | ICD-10-CM | POA: Diagnosis not present

## 2017-12-27 DIAGNOSIS — R791 Abnormal coagulation profile: Secondary | ICD-10-CM | POA: Diagnosis not present

## 2017-12-27 DIAGNOSIS — I4891 Unspecified atrial fibrillation: Secondary | ICD-10-CM | POA: Diagnosis not present

## 2017-12-27 DIAGNOSIS — Z6821 Body mass index (BMI) 21.0-21.9, adult: Secondary | ICD-10-CM | POA: Diagnosis not present

## 2018-01-21 ENCOUNTER — Emergency Department (HOSPITAL_COMMUNITY)
Admission: EM | Admit: 2018-01-21 | Discharge: 2018-01-21 | Disposition: A | Discharge status: Home / Self Care | Payer: Medicare Other | Attending: Emergency Medicine | Admitting: Emergency Medicine

## 2018-01-21 ENCOUNTER — Emergency Department (HOSPITAL_COMMUNITY): Payer: Medicare Other

## 2018-01-21 ENCOUNTER — Encounter (HOSPITAL_COMMUNITY): Payer: Self-pay | Admitting: Emergency Medicine

## 2018-01-21 DIAGNOSIS — I2510 Atherosclerotic heart disease of native coronary artery without angina pectoris: Secondary | ICD-10-CM | POA: Diagnosis not present

## 2018-01-21 DIAGNOSIS — R109 Unspecified abdominal pain: Secondary | ICD-10-CM | POA: Diagnosis not present

## 2018-01-21 DIAGNOSIS — Z8546 Personal history of malignant neoplasm of prostate: Secondary | ICD-10-CM | POA: Diagnosis not present

## 2018-01-21 DIAGNOSIS — I110 Hypertensive heart disease with heart failure: Secondary | ICD-10-CM | POA: Diagnosis not present

## 2018-01-21 DIAGNOSIS — N200 Calculus of kidney: Secondary | ICD-10-CM | POA: Insufficient documentation

## 2018-01-21 DIAGNOSIS — Z6821 Body mass index (BMI) 21.0-21.9, adult: Secondary | ICD-10-CM | POA: Diagnosis not present

## 2018-01-21 DIAGNOSIS — I509 Heart failure, unspecified: Secondary | ICD-10-CM | POA: Insufficient documentation

## 2018-01-21 DIAGNOSIS — N132 Hydronephrosis with renal and ureteral calculous obstruction: Secondary | ICD-10-CM | POA: Diagnosis not present

## 2018-01-21 DIAGNOSIS — R1031 Right lower quadrant pain: Secondary | ICD-10-CM | POA: Diagnosis not present

## 2018-01-21 DIAGNOSIS — E039 Hypothyroidism, unspecified: Secondary | ICD-10-CM | POA: Diagnosis not present

## 2018-01-21 DIAGNOSIS — Z950 Presence of cardiac pacemaker: Secondary | ICD-10-CM | POA: Diagnosis not present

## 2018-01-21 DIAGNOSIS — I4891 Unspecified atrial fibrillation: Secondary | ICD-10-CM | POA: Diagnosis not present

## 2018-01-21 DIAGNOSIS — R319 Hematuria, unspecified: Secondary | ICD-10-CM | POA: Diagnosis not present

## 2018-01-21 LAB — HEPATIC FUNCTION PANEL
ALT: 38 U/L (ref 0–44)
AST: 28 U/L (ref 15–41)
Albumin: 4.2 g/dL (ref 3.5–5.0)
Alkaline Phosphatase: 45 U/L (ref 38–126)
BILIRUBIN DIRECT: 0.1 mg/dL (ref 0.0–0.2)
BILIRUBIN INDIRECT: 0.9 mg/dL (ref 0.3–0.9)
Total Bilirubin: 1 mg/dL (ref 0.3–1.2)
Total Protein: 8.1 g/dL (ref 6.5–8.1)

## 2018-01-21 LAB — BASIC METABOLIC PANEL
Anion gap: 11 (ref 5–15)
BUN: 18 mg/dL (ref 8–23)
CALCIUM: 10.3 mg/dL (ref 8.9–10.3)
CO2: 24 mmol/L (ref 22–32)
CREATININE: 0.86 mg/dL (ref 0.61–1.24)
Chloride: 106 mmol/L (ref 98–111)
GFR calc Af Amer: >60 mL/min (ref >60)
GLUCOSE: 102 mg/dL — AB (ref 70–99)
Potassium: 5.3 mmol/L — ABNORMAL HIGH (ref 3.5–5.1)
Sodium: 141 mmol/L (ref 135–145)

## 2018-01-21 LAB — URINALYSIS, ROUTINE W REFLEX MICROSCOPIC
Bilirubin Urine: NEGATIVE
Glucose, UA: NEGATIVE mg/dL
Ketones, ur: NEGATIVE mg/dL
Leukocytes, UA: NEGATIVE
Nitrite: NEGATIVE
Protein, ur: NEGATIVE mg/dL
SPECIFIC GRAVITY, URINE: 1.008 (ref 1.005–1.030)
pH: 5 (ref 5.0–8.0)

## 2018-01-21 LAB — CBC
HCT: 41.7 % (ref 39.0–52.0)
Hemoglobin: 14.3 g/dL (ref 13.0–17.0)
MCH: 31 pg (ref 26.0–34.0)
MCHC: 34.3 g/dL (ref 30.0–36.0)
MCV: 90.3 fL (ref 78.0–100.0)
Platelets: 314 10*3/uL (ref 150–400)
RBC: 4.62 MIL/uL (ref 4.22–5.81)
RDW: 13.3 % (ref 11.5–15.5)
WBC: 8.3 10*3/uL (ref 4.0–10.5)

## 2018-01-21 LAB — LIPASE, BLOOD: LIPASE: 45 U/L (ref 11–51)

## 2018-01-21 MED ORDER — SODIUM CHLORIDE 0.9 % IV BOLUS
500.0000 mL | Freq: Once | INTRAVENOUS | Status: AC
  Administered 2018-01-21: 500 mL via INTRAVENOUS

## 2018-01-21 MED ORDER — MORPHINE SULFATE (PF) 4 MG/ML IV SOLN
4.0000 mg | Freq: Once | INTRAVENOUS | Status: AC
  Administered 2018-01-21: 4 mg via INTRAVENOUS
  Filled 2018-01-21: qty 1

## 2018-01-21 MED ORDER — HYDROMORPHONE HCL 1 MG/ML IJ SOLN
0.5000 mg | Freq: Once | INTRAMUSCULAR | Status: AC
  Administered 2018-01-21: 0.5 mg via INTRAVENOUS
  Filled 2018-01-21: qty 1

## 2018-01-21 MED ORDER — OXYCODONE HCL 5 MG PO TABS: 5.0000 mg | ORAL_TABLET | ORAL | 0 refills | Status: DC | PRN

## 2018-01-21 NOTE — Discharge Instructions (Signed)
You were evaluated in the Emergency Department and after careful evaluation, we did not find any emergent condition requiring admission or further testing in the hospital.  Your symptoms today seem to be due to kidney stone.  He is medication provided as needed for pain and follow-up with your urology doctor.  Please return to the Emergency Department if you experience any worsening of your condition.  We encourage you to follow up with a primary care provider.  Thank you for allowing [place] to be a part of your care.

## 2018-01-21 NOTE — ED Notes (Signed)
Lab called to add on blood to blood in lab

## 2018-01-21 NOTE — ED Provider Notes (Signed)
[facility] Emergency Department Provider Note MRN:  [id number]  Arrival date & time: 01/21/18     Chief Complaint   Flank Pain   History of Present Illness   Mitchell Murray is a 77 y.o. year-old male with a history of CAD, CHF, kidney stones presenting to the ED with chief complaint of flank pain.  Pain is located in the right flank, nonradiating.  Pain began suddenly yesterday afternoon, has been constant.  Feels very similar to prior kidney stone.  Pain is 8 out of 10 in severity, dull achy pain.  Associated with hematuria, no dysuria, no other associated symptoms.  Denies fevers, no chest pain or shortness of breath, no abdominal pain.  Sent here by PCP for further evaluation.  Review of Systems  A complete 10 system review of systems was obtained and all systems are negative except as noted in the HPI and PMH.   Patient's Health History    Past Medical History:  Diagnosis Date  . Automatic implantable cardioverter-defibrillator in situ   . Cardiomegaly   . CHF (congestive heart failure) ([place])   . Coronary artery disease   . Dysrhythmia    atrial fib  . GERD (gastroesophageal reflux disease)   . Hard of hearing   . Hyperlipidemia   . Hypertension   . Hypothyroidism   . Kidney stones   . MI (myocardial infarction) ([place])   . Pacemaker   . Prostate cancer [facility])     Past Surgical History:  Procedure Laterality Date  . CARDIAC DEFIBRILLATOR PLACEMENT    . CYSTOSCOPY WITH LITHOLAPAXY N/A 01/24/2013   Procedure: CYSTOSCOPY WITH LITHOLAPAXY;  Surgeon: Ardis Hughs, MD;  Location: [facility] ORS;  Service: Urology;  Laterality: N/A;  . HERNIA REPAIR    . HOLMIUM LASER APPLICATION N/A 7/67/2094   Procedure: HOLMIUM LASER APPLICATION;  Surgeon: Ardis Hughs, MD;  Location: [facility];  Service: Urology;  Laterality: N/A;  . PACEMAKER INSERTION    . PROSTATECTOMY      Family History  Problem Relation Age of Onset  . Heart failure Mother   . Cancer  Father   . Heart failure Sister     Social History   Socioeconomic History  . Marital status: Married    Spouse name: Not on file  . Number of children: Not on file  . Years of education: Not on file  . Highest education level: Not on file  Occupational History  . Occupation: retired    Comment: Arboriculturist  . Financial resource strain: Not on file  . Food insecurity:    Worry: Not on file    Inability: Not on file  . Transportation needs:    Medical: Not on file    Non-medical: Not on file  Tobacco Use  . Smoking status: Never Smoker  . Smokeless tobacco: Never Used  Substance and Sexual Activity  . Alcohol use: No  . Drug use: No  . Sexual activity: Never  Lifestyle  . Physical activity:    Days per week: Not on file    Minutes per session: Not on file  . Stress: Not on file  Relationships  . Social connections:    Talks on phone: Not on file    Gets together: Not on file    Attends religious service: Not on file    Active member of club or organization: Not on file    Attends meetings of clubs or organizations: Not on file  Relationship status: Not on file  . Intimate partner violence:    Fear of current or ex partner: Not on file    Emotionally abused: Not on file    Physically abused: Not on file    Forced sexual activity: Not on file  Other Topics Concern  . Not on file  Social History Narrative  . Not on file     Physical Exam  Vital Signs and Nursing Notes reviewed Vitals:   01/21/18 1809 01/21/18 1821  BP: (!) 143/76   Pulse: 85 76  Resp: 18   Temp:    SpO2: 99% 98%    CONSTITUTIONAL: Well-appearing, NAD NEURO:  Alert and oriented x 3, no focal deficits EYES:  eyes equal and reactive ENT/NECK:  no LAD, no JVD CARDIO: Regular rate, well-perfused, normal S1 and S2 PULM:  CTAB no wheezing or rhonchi GI/GU:  normal bowel sounds, non-distended, non-tender, right CVA tenderness MSK/SPINE:  No gross deformities, no edema SKIN:   no rash, atraumatic PSYCH:  Appropriate speech and behavior  Diagnostic and Interventional Summary    EKG Interpretation  Date/Time:    Ventricular Rate:    PR Interval:    QRS Duration:   QT Interval:    QTC Calculation:   R Axis:     Text Interpretation:        Labs Reviewed  URINALYSIS, ROUTINE W REFLEX MICROSCOPIC - Abnormal; Notable for the following components:      Result Value   Hgb urine dipstick LARGE (*)    RBC / HPF >50 (*)    Bacteria, UA RARE (*)    All other components within normal limits  BASIC METABOLIC PANEL - Abnormal; Notable for the following components:   Potassium 5.3 (*)    Glucose, Bld 102 (*)    All other components within normal limits  CBC  LIPASE, BLOOD  HEPATIC FUNCTION PANEL    CT Renal Stone Study  Final Result      Medications  sodium chloride 0.9 % bolus 500 mL (0 mLs Intravenous Stopped 01/21/18 1750)  HYDROmorphone (DILAUDID) injection 0.5 mg (0.5 mg Intravenous Given 01/21/18 1645)  morphine 4 MG/ML injection 4 mg (4 mg Intravenous Given 01/21/18 1803)     Procedures Critical Care  ED Course and Medical Decision Making  I have reviewed the triage vital signs and the nursing notes.  Pertinent labs & imaging results that were available during my care of the patient were reviewed by me and considered in my medical decision making (see below for details).    Well-appearing 77 year old male with likely recurrent kidney stone.  Will CT to confirm, attempt pain control here in the ED.  CT confirms 3 mm right-sided kidney stone.  UA with no evidence of infection, labs reveal normal kidney function.  Patient is feeling much better, ready for discharge.  Prescription for short course oxycodone, patient will follow up with his own urologist.  After the discussed management above, the patient was determined to be safe for discharge.  The patient was in agreement with this plan and all questions regarding their care were answered.  ED  return precautions were discussed and the patient will return to the ED with any significant worsening of condition.  Barth Kirks. Sedonia Small, MD [place] [email or web address]  Final Clinical Impressions(s) / ED Diagnoses     ICD-10-CM   1. Flank pain R10.9   2. Kidney stone N20.0     ED Discharge Orders  Ordered    oxyCODONE (ROXICODONE) 5 MG immediate release tablet  Every 4 hours PRN     01/21/18 1855             Maudie Flakes, MD 01/21/18 [phone]

## 2018-01-21 NOTE — ED Notes (Signed)
Pt still reporting pain. MD made aware

## 2018-01-21 NOTE — ED Triage Notes (Signed)
Per pt, states right flank pain that started yesterday-last kidney stone was 2 months ago-saw PCP and they told him to come here-no N/V-no difficulty urinating although blood in urine

## 2018-01-23 DIAGNOSIS — I4891 Unspecified atrial fibrillation: Secondary | ICD-10-CM | POA: Diagnosis not present

## 2018-01-23 DIAGNOSIS — N200 Calculus of kidney: Secondary | ICD-10-CM | POA: Diagnosis not present

## 2018-01-23 DIAGNOSIS — R791 Abnormal coagulation profile: Secondary | ICD-10-CM | POA: Diagnosis not present

## 2018-01-23 DIAGNOSIS — I420 Dilated cardiomyopathy: Secondary | ICD-10-CM | POA: Diagnosis not present

## 2018-01-23 DIAGNOSIS — Z6821 Body mass index (BMI) 21.0-21.9, adult: Secondary | ICD-10-CM | POA: Diagnosis not present

## 2018-01-24 DIAGNOSIS — N201 Calculus of ureter: Secondary | ICD-10-CM | POA: Diagnosis not present

## 2018-01-28 DIAGNOSIS — I2510 Atherosclerotic heart disease of native coronary artery without angina pectoris: Secondary | ICD-10-CM | POA: Diagnosis not present

## 2018-01-28 DIAGNOSIS — I5022 Chronic systolic (congestive) heart failure: Secondary | ICD-10-CM | POA: Diagnosis not present

## 2018-01-28 DIAGNOSIS — I420 Dilated cardiomyopathy: Secondary | ICD-10-CM | POA: Diagnosis not present

## 2018-01-28 DIAGNOSIS — I480 Paroxysmal atrial fibrillation: Secondary | ICD-10-CM | POA: Diagnosis not present

## 2018-01-28 DIAGNOSIS — I110 Hypertensive heart disease with heart failure: Secondary | ICD-10-CM | POA: Diagnosis not present

## 2018-02-01 DIAGNOSIS — I420 Dilated cardiomyopathy: Secondary | ICD-10-CM | POA: Diagnosis not present

## 2018-02-01 DIAGNOSIS — I5022 Chronic systolic (congestive) heart failure: Secondary | ICD-10-CM | POA: Diagnosis not present

## 2018-02-06 ENCOUNTER — Other Ambulatory Visit: Payer: Self-pay

## 2018-02-06 DIAGNOSIS — Z79899 Other long term (current) drug therapy: Secondary | ICD-10-CM | POA: Diagnosis not present

## 2018-02-06 DIAGNOSIS — I420 Dilated cardiomyopathy: Secondary | ICD-10-CM | POA: Diagnosis not present

## 2018-02-06 DIAGNOSIS — E118 Type 2 diabetes mellitus with unspecified complications: Secondary | ICD-10-CM | POA: Diagnosis not present

## 2018-02-06 DIAGNOSIS — Z23 Encounter for immunization: Secondary | ICD-10-CM | POA: Diagnosis not present

## 2018-02-06 DIAGNOSIS — R791 Abnormal coagulation profile: Secondary | ICD-10-CM | POA: Diagnosis not present

## 2018-02-06 DIAGNOSIS — I4891 Unspecified atrial fibrillation: Secondary | ICD-10-CM | POA: Diagnosis not present

## 2018-02-06 DIAGNOSIS — E063 Autoimmune thyroiditis: Secondary | ICD-10-CM | POA: Diagnosis not present

## 2018-02-06 DIAGNOSIS — E785 Hyperlipidemia, unspecified: Secondary | ICD-10-CM | POA: Diagnosis not present

## 2018-02-06 DIAGNOSIS — D649 Anemia, unspecified: Secondary | ICD-10-CM | POA: Diagnosis not present

## 2018-02-06 NOTE — Patient Outreach (Signed)
[facility]) Care Management  02/06/2018  HORRACE HANAK 31-May-1940 [id number]   Medication Adherence call to Mr. [name] patient is due on Atorvastatin 10 mg spoke with patient he said he has medication and doctor fill medication when he is due. Mr. Cerone is showing past due under [place].   [place] Management Direct Dial [phone]  Fax [phone] [email or web address]

## 2018-02-11 DIAGNOSIS — L570 Actinic keratosis: Secondary | ICD-10-CM | POA: Diagnosis not present

## 2018-02-11 DIAGNOSIS — L821 Other seborrheic keratosis: Secondary | ICD-10-CM | POA: Diagnosis not present

## 2018-02-11 DIAGNOSIS — C44529 Squamous cell carcinoma of skin of other part of trunk: Secondary | ICD-10-CM | POA: Diagnosis not present

## 2018-02-11 DIAGNOSIS — L578 Other skin changes due to chronic exposure to nonionizing radiation: Secondary | ICD-10-CM | POA: Diagnosis not present

## 2018-02-11 DIAGNOSIS — L820 Inflamed seborrheic keratosis: Secondary | ICD-10-CM | POA: Diagnosis not present

## 2018-03-07 ENCOUNTER — Encounter: Payer: Self-pay | Admitting: Psychology

## 2018-03-07 ENCOUNTER — Ambulatory Visit (INDEPENDENT_AMBULATORY_CARE_PROVIDER_SITE_OTHER): Payer: Medicare Other | Admitting: Psychology

## 2018-03-07 ENCOUNTER — Ambulatory Visit: Payer: Medicare Other | Admitting: Psychology

## 2018-03-07 DIAGNOSIS — R251 Tremor, unspecified: Secondary | ICD-10-CM | POA: Diagnosis not present

## 2018-03-07 DIAGNOSIS — G3184 Mild cognitive impairment, so stated: Secondary | ICD-10-CM

## 2018-03-07 NOTE — Progress Notes (Signed)
   Neuropsychology Note  Mitchell Murray completed 60 minutes of neuropsychological testing with technician, Milana Kidney, BS, under the supervision of Dr. Macarthur Critchley, Licensed Psychologist. The patient did not appear overtly distressed by the testing session, per behavioral observation or via self-report to the technician. Rest breaks were offered.   Clinical Decision Making: In considering the patient's current level of functioning, level of presumed impairment, nature of symptoms, emotional and behavioral responses during the interview, level of literacy, and observed level of motivation/effort, a battery of tests was selected and communicated to the psychometrician.  Communication between the psychologist and technician was ongoing throughout the testing session and changes were made as deemed necessary based on patient performance on testing, technician observations and additional pertinent factors such as those listed above.  Mitchell Murray will return within approximately 2 weeks for an interactive feedback session with Dr. Si Raider at which time his test performances, clinical impressions and treatment recommendations will be reviewed in detail. The patient understands he can contact our office should he require our assistance before this time.  35 minutes spent performing neuropsychological evaluation services/clinical decision making (psychologist). [CPT 35456] 60 minutes spent face-to-face with patient administering standardized tests, 30 minutes spent scoring (technician). [CPT Y8200648, 25638]  Full report to follow.
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
## 2018-03-11 ENCOUNTER — Encounter: Payer: Self-pay | Admitting: Psychology

## 2018-03-11 DIAGNOSIS — Z7901 Long term (current) use of anticoagulants: Secondary | ICD-10-CM | POA: Diagnosis not present

## 2018-03-11 DIAGNOSIS — I4891 Unspecified atrial fibrillation: Secondary | ICD-10-CM | POA: Diagnosis not present
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
## 2018-03-12 ENCOUNTER — Telehealth: Payer: Self-pay | Admitting: Neurology

## 2018-03-12 NOTE — Telephone Encounter (Signed)
Mitchell Murray, you can make a follow up with me for the patient to go over results of neurocognitive testing but, in brief, you can tell him/wife that Dr. Si Raider did not think that he would be a good surgical candidate

## 2018-03-12 NOTE — Telephone Encounter (Signed)
-----   Message from [place], DO sent at 03/12/2018  7:44 AM EST -----   ----- Message ----- From: Kandis Nab, PsyD Sent: 03/11/2018   4:22 PM EST To: Eustace Quail Tat, DO

## 2018-03-12 NOTE — Progress Notes (Signed)
Mitchell Murray, you can make a follow up with me for the patient to go over results of neurocognitive testing but, in brief, you can tell him/wife that Dr. Si Raider did not think that he would be a good surgical candidate

## 2018-03-12 NOTE — Telephone Encounter (Signed)
Left message on machine for patient to call back.

## 2018-03-13 NOTE — Telephone Encounter (Signed)
Left another message on machine for patient to call back.

## 2018-03-14 ENCOUNTER — Encounter: Payer: Medicare Other | Admitting: Psychology

## 2018-03-18 DIAGNOSIS — J301 Allergic rhinitis due to pollen: Secondary | ICD-10-CM | POA: Diagnosis not present

## 2018-03-18 DIAGNOSIS — Z6821 Body mass index (BMI) 21.0-21.9, adult: Secondary | ICD-10-CM | POA: Diagnosis not present

## 2018-03-18 DIAGNOSIS — R251 Tremor, unspecified: Secondary | ICD-10-CM | POA: Diagnosis not present

## 2018-04-07 DIAGNOSIS — Z95810 Presence of automatic (implantable) cardiac defibrillator: Secondary | ICD-10-CM | POA: Diagnosis not present

## 2018-04-07 DIAGNOSIS — I4891 Unspecified atrial fibrillation: Secondary | ICD-10-CM | POA: Insufficient documentation

## 2018-04-07 DIAGNOSIS — E039 Hypothyroidism, unspecified: Secondary | ICD-10-CM | POA: Insufficient documentation

## 2018-04-07 DIAGNOSIS — Z7901 Long term (current) use of anticoagulants: Secondary | ICD-10-CM | POA: Insufficient documentation

## 2018-04-07 DIAGNOSIS — I252 Old myocardial infarction: Secondary | ICD-10-CM | POA: Diagnosis not present

## 2018-04-07 DIAGNOSIS — Z79899 Other long term (current) drug therapy: Secondary | ICD-10-CM | POA: Insufficient documentation

## 2018-04-07 DIAGNOSIS — E119 Type 2 diabetes mellitus without complications: Secondary | ICD-10-CM | POA: Insufficient documentation

## 2018-04-07 DIAGNOSIS — I110 Hypertensive heart disease with heart failure: Secondary | ICD-10-CM | POA: Insufficient documentation

## 2018-04-07 DIAGNOSIS — I5022 Chronic systolic (congestive) heart failure: Secondary | ICD-10-CM | POA: Diagnosis not present

## 2018-04-07 DIAGNOSIS — R109 Unspecified abdominal pain: Secondary | ICD-10-CM | POA: Diagnosis not present

## 2018-04-07 DIAGNOSIS — I2510 Atherosclerotic heart disease of native coronary artery without angina pectoris: Secondary | ICD-10-CM | POA: Insufficient documentation

## 2018-04-07 DIAGNOSIS — R1032 Left lower quadrant pain: Secondary | ICD-10-CM | POA: Insufficient documentation

## 2018-04-07 DIAGNOSIS — N200 Calculus of kidney: Secondary | ICD-10-CM | POA: Diagnosis not present

## 2018-04-07 NOTE — ED Notes (Signed)
CALLED PT FOR TRIAGE X1 NO RESPONSE

## 2018-04-08 ENCOUNTER — Emergency Department (HOSPITAL_COMMUNITY)
Admission: EM | Admit: 2018-04-08 | Discharge: 2018-04-08 | Disposition: A | Discharge status: Home / Self Care | Payer: Medicare Other | Attending: Emergency Medicine | Admitting: Emergency Medicine

## 2018-04-08 ENCOUNTER — Encounter (HOSPITAL_COMMUNITY): Payer: Self-pay

## 2018-04-08 ENCOUNTER — Other Ambulatory Visit: Payer: Self-pay

## 2018-04-08 DIAGNOSIS — R109 Unspecified abdominal pain: Secondary | ICD-10-CM

## 2018-04-08 DIAGNOSIS — N200 Calculus of kidney: Secondary | ICD-10-CM

## 2018-04-08 LAB — URINALYSIS, ROUTINE W REFLEX MICROSCOPIC
BILIRUBIN URINE: NEGATIVE
Glucose, UA: NEGATIVE mg/dL
KETONES UR: NEGATIVE mg/dL
LEUKOCYTES UA: NEGATIVE
Nitrite: NEGATIVE
PROTEIN: 100 mg/dL — AB
Specific Gravity, Urine: 1.019 (ref 1.005–1.030)
pH: 5 (ref 5.0–8.0)

## 2018-04-08 LAB — I-STAT CHEM 8, ED
BUN: 27 mg/dL — ABNORMAL HIGH (ref 8–23)
CREATININE: 1 mg/dL (ref 0.61–1.24)
Calcium, Ion: 1.33 mmol/L (ref 1.15–1.40)
Chloride: 109 mmol/L (ref 98–111)
Glucose, Bld: 114 mg/dL — ABNORMAL HIGH (ref 70–99)
HEMATOCRIT: 43 % (ref 39.0–52.0)
Hemoglobin: 14.6 g/dL (ref 13.0–17.0)
Potassium: 4.9 mmol/L (ref 3.5–5.1)
SODIUM: 142 mmol/L (ref 135–145)
TCO2: 26 mmol/L (ref 22–32)

## 2018-04-08 MED ORDER — OXYCODONE-ACETAMINOPHEN 5-325 MG PO TABS: 1.0000 | ORAL_TABLET | ORAL | 0 refills | Status: DC | PRN

## 2018-04-08 MED ORDER — MORPHINE SULFATE (PF) 4 MG/ML IV SOLN
4.0000 mg | Freq: Once | INTRAVENOUS | Status: AC
  Administered 2018-04-08: 4 mg via INTRAVENOUS
  Filled 2018-04-08: qty 1

## 2018-04-08 MED ORDER — ONDANSETRON HCL 4 MG/2ML IJ SOLN
4.0000 mg | Freq: Once | INTRAMUSCULAR | Status: AC
  Administered 2018-04-08: 4 mg via INTRAVENOUS
  Filled 2018-04-08: qty 2

## 2018-04-08 MED ORDER — OXYCODONE-ACETAMINOPHEN 5-325 MG PO TABS
1.0000 | ORAL_TABLET | Freq: Once | ORAL | Status: AC
  Administered 2018-04-08: 1 via ORAL
  Filled 2018-04-08: qty 1

## 2018-04-08 NOTE — ED Provider Notes (Signed)
[place] DEPT Provider Note   CSN: [id number] Arrival date & time: 04/07/18  2257     History   Chief Complaint Chief Complaint  Patient presents with  . Flank Pain    HPI Mitchell Murray is a 77 y.o. male.  The history is provided by the patient and the spouse.  Flank Pain  This is a new problem. The current episode started 3 to 5 hours ago. The problem occurs constantly. The problem has been gradually worsening. Associated symptoms include abdominal pain. Pertinent negatives include no chest pain and no shortness of breath. Nothing aggravates the symptoms. Nothing relieves the symptoms.  Patient with history of CHF, CAD, ICD in place presents with flank pain.  He also has a previous history of kidney stones.  He reports this pain feels similar to prior episodes, this time it is left flank.  He also reports abdominal pain.  No fevers or vomiting.  No other acute complaint  Past Medical History:  Diagnosis Date  . Automatic implantable cardioverter-defibrillator in situ   . Cardiomegaly   . CHF (congestive heart failure) ([place])   . Coronary artery disease   . Dysrhythmia    atrial fib  . GERD (gastroesophageal reflux disease)   . Hard of hearing   . Hyperlipidemia   . Hypertension   . Hypothyroidism   . Kidney stones   . MI (myocardial infarction) ([place])   . Pacemaker   . Prostate cancer [facility])     Patient Active Problem List   Diagnosis Date Noted  . Chronic systolic congestive heart failure ([place])   . DM (diabetes mellitus) ([place]) 06/14/2014  . Orthostatic hypotension   . Weakness generalized 06/13/2014  . Elevated troponin 06/12/2014  . Essential hypertension 06/12/2014  . HLD (hyperlipidemia) 06/12/2014  . GERD (gastroesophageal reflux disease) 06/12/2014  . Hypothyroidism 12/03/2012  . Hypokalemia 12/03/2012  . Hyperglycemia 12/03/2012  . Right flank pain 12/02/2012  . CAD (coronary artery disease) 12/02/2012  . AICD  (automatic cardioverter/defibrillator) present 12/02/2012  . A-fib ([place]) 12/02/2012  . Prostate cancer ([place]) 12/02/2012  . Pyelonephritis 12/02/2012    Past Surgical History:  Procedure Laterality Date  . CARDIAC DEFIBRILLATOR PLACEMENT    . CYSTOSCOPY WITH LITHOLAPAXY N/A 01/24/2013   Procedure: CYSTOSCOPY WITH LITHOLAPAXY;  Surgeon: Ardis Hughs, MD;  Location: [facility] ORS;  Service: Urology;  Laterality: N/A;  . HERNIA REPAIR    . HOLMIUM LASER APPLICATION N/A 2/59/5638   Procedure: HOLMIUM LASER APPLICATION;  Surgeon: Ardis Hughs, MD;  Location: [facility]S;  Service: Urology;  Laterality: N/A;  . PACEMAKER INSERTION    . PROSTATECTOMY          Home Medications    Prior to Admission medications   Medication Sig Start Date End Date Taking? Authorizing Provider  atorvastatin (LIPITOR) 10 MG tablet Take 10 mg by mouth every evening.    Yes [provider]  digoxin (LANOXIN) 0.125 MG tablet Take 0.125 mg by mouth daily.   Yes [provider]  gabapentin (NEURONTIN) 100 MG capsule Take 100 mg by mouth at bedtime.   Yes [provider]  levothyroxine (SYNTHROID, LEVOTHROID) 50 MCG tablet Take 50 mcg by mouth daily before breakfast.   Yes [provider]  meclizine (ANTIVERT) 25 MG tablet Take 12.5 mg by mouth 3 (three) times daily as needed for dizziness.    Yes [provider]  metFORMIN (GLUCOPHAGE) 1000 MG tablet Take 1,000 mg by mouth 2 (two)  times daily.   Yes [provider]  metoprolol succinate (TOPROL-XL) 50 MG 24 hr tablet Take 75 mg by mouth daily. Take with or immediately following a meal.    Yes [provider]  Omega-3 1000 MG CAPS Take 1,000 mg by mouth daily.    Yes [provider]  omeprazole (PRILOSEC) 40 MG capsule Take 40 mg by mouth daily as needed (Heart Burn).    Yes [provider]  sertraline (ZOLOFT) 25 MG tablet Take 25 mg by mouth daily as needed. Depression 11/12/17  Yes  [provider]  spironolactone (ALDACTONE) 25 MG tablet Take 25 mg by mouth daily. 03/26/17  Yes [provider]  warfarin (COUMADIN) 2.5 MG tablet Take 2.5 mg by mouth every evening.    Yes [provider]  docusate sodium (COLACE) 50 MG capsule Take 2 capsules (100 mg total) by mouth 2 (two) times daily as needed for constipation. Patient not taking: Reported on 08/25/2017 01/24/13   Ardis Hughs, MD  ondansetron (ZOFRAN) 8 MG tablet Take 1 tablet (8 mg total) by mouth every 12 (twelve) hours as needed for nausea. Patient not taking: Reported on 01/21/2018 08/26/17   Palumbo, April, MD  oxyCODONE (ROXICODONE) 5 MG immediate release tablet Take 1 tablet (5 mg total) by mouth every 4 (four) hours as needed for severe pain. Patient not taking: Reported on 04/08/2018 01/21/18   Maudie Flakes, MD  oxyCODONE-acetaminophen (PERCOCET) 5-325 MG tablet Take 1 tablet by mouth every 6 (six) hours as needed. Patient not taking: Reported on 01/21/2018 08/26/17   Palumbo, April, MD  tamsulosin (FLOMAX) 0.4 MG CAPS capsule Take 1 capsule (0.4 mg total) by mouth daily. Patient not taking: Reported on 01/21/2018 08/26/17   Randal Buba, April, MD    Family History Family History  Problem Relation Age of Onset  . Heart failure Mother   . Cancer Father   . Heart failure Sister     Social History Social History   Tobacco Use  . Smoking status: Never Smoker  . Smokeless tobacco: Never Used  Substance Use Topics  . Alcohol use: No  . Drug use: No     Allergies   Ivp dye [iodinated diagnostic agents] and Metrizamide   Review of Systems Review of Systems  Constitutional: Negative for fever.  Respiratory: Negative for shortness of breath.   Cardiovascular: Negative for chest pain.  Gastrointestinal: Positive for abdominal pain. Negative for vomiting.  Genitourinary: Positive for flank pain.  All other systems reviewed and are negative.    Physical Exam Updated Vital  Signs BP 138/76   Pulse 65   Temp 97.6 F (36.4 C) (Oral)   Resp 16   SpO2 97%   Physical Exam CONSTITUTIONAL: Elderly, no acute distress HEAD: Normocephalic/atraumatic EYES: EOMI/PERRL ENMT: Mucous membranes moist NECK: supple no meningeal signs SPINE/BACK:entire spine nontender CV: S1/S2 noted, no murmurs/rubs/gallops noted LUNGS: Lungs are clear to auscultation bilaterally, no apparent distress ABDOMEN: soft, nontender, no rebound or guarding, bowel sounds noted throughout abdomen GU: Left Cva tenderness NEURO: Pt is awake/alert/appropriate, moves all extremitiesx4.  No facial droop.   EXTREMITIES: pulses normal/equal, full ROM SKIN: warm, color normal PSYCH: no abnormalities of mood noted, alert and oriented to situation   ED Treatments / Results  Labs (all labs ordered are listed, but only abnormal results are displayed) Labs Reviewed  URINALYSIS, ROUTINE W REFLEX MICROSCOPIC - Abnormal; Notable for the following components:      Result Value   Color, Urine  BROWN (*)    APPearance CLOUDY (*)    Hgb urine dipstick LARGE (*)    Protein, ur 100 (*)    RBC / HPF >50 (*)    Bacteria, UA RARE (*)    All other components within normal limits  I-STAT CHEM 8, ED - Abnormal; Notable for the following components:   BUN 27 (*)    Glucose, Bld 114 (*)    All other components within normal limits    EKG None  Radiology No results found.  Procedures Procedures    Medications Ordered in ED Medications  morphine 4 MG/ML injection 4 mg (4 mg Intravenous Given 04/08/18 0335)  ondansetron (ZOFRAN) injection 4 mg (4 mg Intravenous Given 04/08/18 0335)  oxyCODONE-acetaminophen (PERCOCET/ROXICET) 5-325 MG per tablet 1 tablet (1 tablet Oral Given 04/08/18 0519)   Narcotic database reviewed and considered in decision making  Initial Impression / Assessment and Plan / ED Course  I have reviewed the triage vital signs and the nursing notes.  Pertinent labs  results that were  available during my care of the patient were reviewed by me and considered in my medical decision making (see chart for details).     Long history of kidney stones on both sides presents with flank pain.  He reports similar to prior kidney stones.  Urinalysis consistent with hematuria, likely ureteral stone. Patient feels improved, no acute distress.  No signs of UTI.  Renal function is appropriate.  Will discharge home with short course of pain medications urology follow-up. Final Clinical Impressions(s) / ED Diagnoses   Final diagnoses:  Flank pain  Kidney stone    ED Discharge Orders         Ordered    oxyCODONE-acetaminophen (PERCOCET) 5-325 MG tablet  Every 4 hours PRN     04/08/18 0529           Ripley Fraise, MD 04/08/18 0530

## 2018-04-08 NOTE — ED Triage Notes (Signed)
Pt reports L flank pain and hematuria that started tonight. He endorses a hx of kidney stone. VSS, afebrile. A&Ox4. Ambulatory.

## 2018-04-08 NOTE — ED Notes (Signed)
Pt given urine cup and directed to get a sample when he could.

## 2018-04-09 ENCOUNTER — Telehealth: Payer: Self-pay | Admitting: Neurology

## 2018-04-09 NOTE — Telephone Encounter (Signed)
Spoke with patient. Let him know that based on Dr. Marcia Brash assessment that he would not be a good candidate for DBS. He asked about shock therapy, which I am not sure what he is referring to, but may be focused ultrasound, he would not be a candidate for that either. Discussed that with his degree of tremor Dr. Carles Collet didn't feel medication would be helpful, especially due to the interaction with the medication he is already taking. I offered him an appointment to come back in and talk to Dr. Carles Collet about all of these things. He states he will think about all of this and call back to make an appt if he decides he would like to discuss further.

## 2018-04-09 NOTE — Telephone Encounter (Signed)
Patient is calling with questions regarding Memory test and results as well as wanting to know if he can do some kind of shock therapy for Tremors. Please call him back at [phone]. Thanks!

## 2018-04-11 DIAGNOSIS — I4891 Unspecified atrial fibrillation: Secondary | ICD-10-CM | POA: Diagnosis not present

## 2018-04-11 DIAGNOSIS — R251 Tremor, unspecified: Secondary | ICD-10-CM | POA: Diagnosis not present

## 2018-04-11 DIAGNOSIS — N200 Calculus of kidney: Secondary | ICD-10-CM | POA: Diagnosis not present

## 2018-04-11 DIAGNOSIS — Z6821 Body mass index (BMI) 21.0-21.9, adult: Secondary | ICD-10-CM | POA: Diagnosis not present

## 2018-04-11 DIAGNOSIS — R791 Abnormal coagulation profile: Secondary | ICD-10-CM | POA: Diagnosis not present

## 2018-04-15 ENCOUNTER — Encounter: Payer: Medicare Other | Admitting: Psychology

## 2018-04-25 DIAGNOSIS — R251 Tremor, unspecified: Secondary | ICD-10-CM | POA: Diagnosis not present

## 2018-04-25 DIAGNOSIS — N200 Calculus of kidney: Secondary | ICD-10-CM | POA: Diagnosis not present

## 2018-04-25 DIAGNOSIS — R791 Abnormal coagulation profile: Secondary | ICD-10-CM | POA: Diagnosis not present

## 2018-04-25 DIAGNOSIS — E1140 Type 2 diabetes mellitus with diabetic neuropathy, unspecified: Secondary | ICD-10-CM | POA: Diagnosis not present

## 2018-04-25 DIAGNOSIS — T381X1A Poisoning by thyroid hormones and substitutes, accidental (unintentional), initial encounter: Secondary | ICD-10-CM | POA: Diagnosis not present

## 2018-04-25 DIAGNOSIS — I4891 Unspecified atrial fibrillation: Secondary | ICD-10-CM | POA: Diagnosis not present

## 2018-05-09 DIAGNOSIS — I4891 Unspecified atrial fibrillation: Secondary | ICD-10-CM | POA: Diagnosis not present

## 2018-05-09 DIAGNOSIS — Z6821 Body mass index (BMI) 21.0-21.9, adult: Secondary | ICD-10-CM | POA: Diagnosis not present

## 2018-05-09 DIAGNOSIS — R791 Abnormal coagulation profile: Secondary | ICD-10-CM | POA: Diagnosis not present

## 2018-05-09 DIAGNOSIS — Z9181 History of falling: Secondary | ICD-10-CM | POA: Diagnosis not present

## 2018-06-10 DIAGNOSIS — I4891 Unspecified atrial fibrillation: Secondary | ICD-10-CM | POA: Diagnosis not present

## 2018-06-10 DIAGNOSIS — R0602 Shortness of breath: Secondary | ICD-10-CM | POA: Diagnosis not present

## 2018-06-10 DIAGNOSIS — R791 Abnormal coagulation profile: Secondary | ICD-10-CM | POA: Diagnosis not present

## 2018-06-10 DIAGNOSIS — R6889 Other general symptoms and signs: Secondary | ICD-10-CM | POA: Diagnosis not present

## 2018-06-10 DIAGNOSIS — J189 Pneumonia, unspecified organism: Secondary | ICD-10-CM | POA: Diagnosis not present

## 2018-06-12 DIAGNOSIS — I509 Heart failure, unspecified: Secondary | ICD-10-CM | POA: Diagnosis not present

## 2018-06-12 DIAGNOSIS — R791 Abnormal coagulation profile: Secondary | ICD-10-CM | POA: Diagnosis not present

## 2018-06-12 DIAGNOSIS — I4891 Unspecified atrial fibrillation: Secondary | ICD-10-CM | POA: Diagnosis not present

## 2018-06-12 DIAGNOSIS — Z6820 Body mass index (BMI) 20.0-20.9, adult: Secondary | ICD-10-CM | POA: Diagnosis not present

## 2018-06-14 DIAGNOSIS — J189 Pneumonia, unspecified organism: Secondary | ICD-10-CM | POA: Diagnosis not present

## 2018-06-14 DIAGNOSIS — I509 Heart failure, unspecified: Secondary | ICD-10-CM | POA: Diagnosis not present

## 2018-06-14 DIAGNOSIS — Z6820 Body mass index (BMI) 20.0-20.9, adult: Secondary | ICD-10-CM | POA: Diagnosis not present

## 2018-06-14 DIAGNOSIS — R05 Cough: Secondary | ICD-10-CM | POA: Diagnosis not present

## 2018-06-20 DIAGNOSIS — Z6820 Body mass index (BMI) 20.0-20.9, adult: Secondary | ICD-10-CM | POA: Diagnosis not present

## 2018-06-20 DIAGNOSIS — R791 Abnormal coagulation profile: Secondary | ICD-10-CM | POA: Diagnosis not present

## 2018-06-20 DIAGNOSIS — I4891 Unspecified atrial fibrillation: Secondary | ICD-10-CM | POA: Diagnosis not present

## 2018-07-04 DIAGNOSIS — I2510 Atherosclerotic heart disease of native coronary artery without angina pectoris: Secondary | ICD-10-CM | POA: Diagnosis not present

## 2018-07-04 DIAGNOSIS — E785 Hyperlipidemia, unspecified: Secondary | ICD-10-CM | POA: Diagnosis not present

## 2018-07-04 DIAGNOSIS — E118 Type 2 diabetes mellitus with unspecified complications: Secondary | ICD-10-CM | POA: Diagnosis not present

## 2018-07-04 DIAGNOSIS — I4891 Unspecified atrial fibrillation: Secondary | ICD-10-CM | POA: Diagnosis not present

## 2018-07-04 DIAGNOSIS — R251 Tremor, unspecified: Secondary | ICD-10-CM | POA: Diagnosis not present

## 2018-07-04 DIAGNOSIS — E063 Autoimmune thyroiditis: Secondary | ICD-10-CM | POA: Diagnosis not present

## 2018-07-04 DIAGNOSIS — K219 Gastro-esophageal reflux disease without esophagitis: Secondary | ICD-10-CM | POA: Diagnosis not present

## 2018-07-04 DIAGNOSIS — Z7901 Long term (current) use of anticoagulants: Secondary | ICD-10-CM | POA: Diagnosis not present

## 2018-07-05 ENCOUNTER — Other Ambulatory Visit: Payer: Self-pay

## 2018-07-05 NOTE — Patient Outreach (Signed)
[facility]) Care Management  07/05/2018  Mitchell Murray Mar 27, 1941 [id number]     Medication Adherence call to Mitchell Murray spoke with patient he is due on Atorvastatin 10 mg he explain he is no longer taking Atorvastatin 10 mg he recently saw doctor and have increased the mg but could not remember to what mg doctor increased too. Mitchell Murray is showing past due under [place] [employer] care Ins.   [place] Management Direct Dial [phone]  Fax [phone] [name]

## 2018-09-03 DIAGNOSIS — I4891 Unspecified atrial fibrillation: Secondary | ICD-10-CM | POA: Diagnosis not present

## 2018-09-03 DIAGNOSIS — Z7901 Long term (current) use of anticoagulants: Secondary | ICD-10-CM | POA: Diagnosis not present

## 2018-10-02 DIAGNOSIS — I110 Hypertensive heart disease with heart failure: Secondary | ICD-10-CM | POA: Diagnosis not present

## 2018-10-02 DIAGNOSIS — I5022 Chronic systolic (congestive) heart failure: Secondary | ICD-10-CM | POA: Diagnosis not present

## 2018-10-02 DIAGNOSIS — I420 Dilated cardiomyopathy: Secondary | ICD-10-CM | POA: Diagnosis not present

## 2018-10-02 DIAGNOSIS — I2510 Atherosclerotic heart disease of native coronary artery without angina pectoris: Secondary | ICD-10-CM | POA: Diagnosis not present

## 2018-10-02 DIAGNOSIS — I480 Paroxysmal atrial fibrillation: Secondary | ICD-10-CM | POA: Diagnosis not present

## 2018-10-04 DIAGNOSIS — M1990 Unspecified osteoarthritis, unspecified site: Secondary | ICD-10-CM | POA: Diagnosis not present

## 2018-10-04 DIAGNOSIS — E118 Type 2 diabetes mellitus with unspecified complications: Secondary | ICD-10-CM | POA: Diagnosis not present

## 2018-10-04 DIAGNOSIS — E785 Hyperlipidemia, unspecified: Secondary | ICD-10-CM | POA: Diagnosis not present

## 2018-10-04 DIAGNOSIS — I4891 Unspecified atrial fibrillation: Secondary | ICD-10-CM | POA: Diagnosis not present

## 2018-10-04 DIAGNOSIS — E063 Autoimmune thyroiditis: Secondary | ICD-10-CM | POA: Diagnosis not present

## 2018-10-04 DIAGNOSIS — I10 Essential (primary) hypertension: Secondary | ICD-10-CM | POA: Diagnosis not present

## 2018-10-04 DIAGNOSIS — R791 Abnormal coagulation profile: Secondary | ICD-10-CM | POA: Diagnosis not present

## 2018-10-17 DIAGNOSIS — L578 Other skin changes due to chronic exposure to nonionizing radiation: Secondary | ICD-10-CM | POA: Diagnosis not present

## 2018-10-17 DIAGNOSIS — L821 Other seborrheic keratosis: Secondary | ICD-10-CM | POA: Diagnosis not present

## 2018-10-17 DIAGNOSIS — L570 Actinic keratosis: Secondary | ICD-10-CM | POA: Diagnosis not present

## 2018-10-18 DIAGNOSIS — I4891 Unspecified atrial fibrillation: Secondary | ICD-10-CM | POA: Diagnosis not present

## 2018-10-18 DIAGNOSIS — Z6821 Body mass index (BMI) 21.0-21.9, adult: Secondary | ICD-10-CM | POA: Diagnosis not present

## 2018-10-18 DIAGNOSIS — R791 Abnormal coagulation profile: Secondary | ICD-10-CM | POA: Diagnosis not present

## 2018-10-22 DIAGNOSIS — G250 Essential tremor: Secondary | ICD-10-CM | POA: Diagnosis not present

## 2018-11-04 DIAGNOSIS — Z6820 Body mass index (BMI) 20.0-20.9, adult: Secondary | ICD-10-CM | POA: Diagnosis not present

## 2018-11-04 DIAGNOSIS — Z7901 Long term (current) use of anticoagulants: Secondary | ICD-10-CM | POA: Diagnosis not present

## 2018-11-04 DIAGNOSIS — I4891 Unspecified atrial fibrillation: Secondary | ICD-10-CM | POA: Diagnosis not present

## 2018-11-25 DIAGNOSIS — Z7901 Long term (current) use of anticoagulants: Secondary | ICD-10-CM | POA: Diagnosis not present

## 2018-11-25 DIAGNOSIS — I4891 Unspecified atrial fibrillation: Secondary | ICD-10-CM | POA: Diagnosis not present

## 2018-11-26 ENCOUNTER — Other Ambulatory Visit: Payer: Self-pay

## 2018-11-26 NOTE — Patient Outreach (Signed)
[facility]) Care Management  11/26/2018  Mitchell Murray May 06, 1940 [id number]   Medication Adherence call to Mitchell Murray Compliant Voice message left with a call back number. Mitchell Murray is showing past due under [place].   [place] Management Direct Dial [phone]  Fax [phone] [email or web address]

## 2018-12-17 ENCOUNTER — Other Ambulatory Visit: Payer: Self-pay

## 2018-12-17 NOTE — Patient Outreach (Signed)
[place] [facility]) Care Management  12/17/2018  Mitchell Murray 01-19-1941 [id number]  Medication Adherence call to Mitchell Murray Compliant Voice message left with a call back number. Mitchell Murray is showing past due on Metformin 1000 mg under [name].   [place] Management Direct Dial [phone]  Fax [phone] [email or web address]

## 2019-01-07 ENCOUNTER — Other Ambulatory Visit: Payer: Self-pay

## 2019-01-07 NOTE — Patient Outreach (Signed)
[place] [facility]) Care Management  01/07/2019  Mitchell Murray 1941/01/01 [id number]   Medication Adherence call to Mitchell Murray Compliant Voice message left with a call back number. Mitchell Murray is showing past due on Metformin 1000 mg under [place].   [place] Management Direct Dial [phone]  Fax [phone] [email or web address]

## 2019-01-09 DIAGNOSIS — J301 Allergic rhinitis due to pollen: Secondary | ICD-10-CM | POA: Diagnosis not present

## 2019-01-09 DIAGNOSIS — E118 Type 2 diabetes mellitus with unspecified complications: Secondary | ICD-10-CM | POA: Diagnosis not present

## 2019-01-09 DIAGNOSIS — I10 Essential (primary) hypertension: Secondary | ICD-10-CM | POA: Diagnosis not present

## 2019-01-09 DIAGNOSIS — E063 Autoimmune thyroiditis: Secondary | ICD-10-CM | POA: Diagnosis not present

## 2019-01-09 DIAGNOSIS — E785 Hyperlipidemia, unspecified: Secondary | ICD-10-CM | POA: Diagnosis not present

## 2019-01-09 DIAGNOSIS — I4891 Unspecified atrial fibrillation: Secondary | ICD-10-CM | POA: Diagnosis not present

## 2019-01-09 DIAGNOSIS — R791 Abnormal coagulation profile: Secondary | ICD-10-CM | POA: Diagnosis not present

## 2019-01-16 DIAGNOSIS — Z6820 Body mass index (BMI) 20.0-20.9, adult: Secondary | ICD-10-CM | POA: Diagnosis not present

## 2019-01-16 DIAGNOSIS — I4891 Unspecified atrial fibrillation: Secondary | ICD-10-CM | POA: Diagnosis not present

## 2019-01-16 DIAGNOSIS — R791 Abnormal coagulation profile: Secondary | ICD-10-CM | POA: Diagnosis not present

## 2019-01-24 DIAGNOSIS — R791 Abnormal coagulation profile: Secondary | ICD-10-CM | POA: Diagnosis not present

## 2019-01-24 DIAGNOSIS — I4891 Unspecified atrial fibrillation: Secondary | ICD-10-CM | POA: Diagnosis not present

## 2019-02-13 ENCOUNTER — Inpatient Hospital Stay (HOSPITAL_COMMUNITY): Payer: Medicare Other

## 2019-02-13 ENCOUNTER — Other Ambulatory Visit: Payer: Self-pay

## 2019-02-13 ENCOUNTER — Inpatient Hospital Stay (HOSPITAL_COMMUNITY)
Admission: AD | Admit: 2019-02-13 | Discharge: 2019-02-17 | DRG: 388 | Disposition: A | Discharge status: Home / Self Care | Payer: Medicare Other | Source: Other Acute Inpatient Hospital | Attending: Family Medicine | Admitting: Family Medicine

## 2019-02-13 DIAGNOSIS — I4819 Other persistent atrial fibrillation: Secondary | ICD-10-CM | POA: Diagnosis not present

## 2019-02-13 DIAGNOSIS — C61 Malignant neoplasm of prostate: Secondary | ICD-10-CM | POA: Diagnosis present

## 2019-02-13 DIAGNOSIS — I428 Other cardiomyopathies: Secondary | ICD-10-CM | POA: Diagnosis not present

## 2019-02-13 DIAGNOSIS — Z23 Encounter for immunization: Secondary | ICD-10-CM

## 2019-02-13 DIAGNOSIS — U071 COVID-19: Secondary | ICD-10-CM | POA: Diagnosis present

## 2019-02-13 DIAGNOSIS — I2510 Atherosclerotic heart disease of native coronary artery without angina pectoris: Secondary | ICD-10-CM | POA: Diagnosis present

## 2019-02-13 DIAGNOSIS — R251 Tremor, unspecified: Secondary | ICD-10-CM | POA: Diagnosis not present

## 2019-02-13 DIAGNOSIS — K56609 Unspecified intestinal obstruction, unspecified as to partial versus complete obstruction: Secondary | ICD-10-CM | POA: Diagnosis not present

## 2019-02-13 DIAGNOSIS — K219 Gastro-esophageal reflux disease without esophagitis: Secondary | ICD-10-CM | POA: Diagnosis present

## 2019-02-13 DIAGNOSIS — Z7901 Long term (current) use of anticoagulants: Secondary | ICD-10-CM | POA: Diagnosis not present

## 2019-02-13 DIAGNOSIS — Z87442 Personal history of urinary calculi: Secondary | ICD-10-CM

## 2019-02-13 DIAGNOSIS — R918 Other nonspecific abnormal finding of lung field: Secondary | ICD-10-CM | POA: Diagnosis not present

## 2019-02-13 DIAGNOSIS — K5669 Other partial intestinal obstruction: Secondary | ICD-10-CM | POA: Diagnosis not present

## 2019-02-13 DIAGNOSIS — R1084 Generalized abdominal pain: Secondary | ICD-10-CM | POA: Diagnosis not present

## 2019-02-13 DIAGNOSIS — I4891 Unspecified atrial fibrillation: Secondary | ICD-10-CM | POA: Diagnosis present

## 2019-02-13 DIAGNOSIS — Z7984 Long term (current) use of oral hypoglycemic drugs: Secondary | ICD-10-CM

## 2019-02-13 DIAGNOSIS — D649 Anemia, unspecified: Secondary | ICD-10-CM | POA: Diagnosis not present

## 2019-02-13 DIAGNOSIS — Z8546 Personal history of malignant neoplasm of prostate: Secondary | ICD-10-CM

## 2019-02-13 DIAGNOSIS — Z66 Do not resuscitate: Secondary | ICD-10-CM | POA: Diagnosis present

## 2019-02-13 DIAGNOSIS — Z95810 Presence of automatic (implantable) cardiac defibrillator: Secondary | ICD-10-CM | POA: Diagnosis not present

## 2019-02-13 DIAGNOSIS — I110 Hypertensive heart disease with heart failure: Secondary | ICD-10-CM | POA: Diagnosis present

## 2019-02-13 DIAGNOSIS — Z950 Presence of cardiac pacemaker: Secondary | ICD-10-CM | POA: Diagnosis not present

## 2019-02-13 DIAGNOSIS — I10 Essential (primary) hypertension: Secondary | ICD-10-CM | POA: Diagnosis not present

## 2019-02-13 DIAGNOSIS — E039 Hypothyroidism, unspecified: Secondary | ICD-10-CM | POA: Diagnosis not present

## 2019-02-13 DIAGNOSIS — I454 Nonspecific intraventricular block: Secondary | ICD-10-CM | POA: Diagnosis not present

## 2019-02-13 DIAGNOSIS — Z8673 Personal history of transient ischemic attack (TIA), and cerebral infarction without residual deficits: Secondary | ICD-10-CM

## 2019-02-13 DIAGNOSIS — I252 Old myocardial infarction: Secondary | ICD-10-CM

## 2019-02-13 DIAGNOSIS — E7800 Pure hypercholesterolemia, unspecified: Secondary | ICD-10-CM | POA: Diagnosis not present

## 2019-02-13 DIAGNOSIS — Z8249 Family history of ischemic heart disease and other diseases of the circulatory system: Secondary | ICD-10-CM | POA: Diagnosis not present

## 2019-02-13 DIAGNOSIS — I5022 Chronic systolic (congestive) heart failure: Secondary | ICD-10-CM | POA: Diagnosis not present

## 2019-02-13 DIAGNOSIS — E119 Type 2 diabetes mellitus without complications: Secondary | ICD-10-CM | POA: Diagnosis present

## 2019-02-13 DIAGNOSIS — K5730 Diverticulosis of large intestine without perforation or abscess without bleeding: Secondary | ICD-10-CM | POA: Diagnosis not present

## 2019-02-13 DIAGNOSIS — I509 Heart failure, unspecified: Secondary | ICD-10-CM | POA: Diagnosis not present

## 2019-02-13 DIAGNOSIS — E785 Hyperlipidemia, unspecified: Secondary | ICD-10-CM | POA: Diagnosis present

## 2019-02-13 DIAGNOSIS — R1013 Epigastric pain: Secondary | ICD-10-CM | POA: Diagnosis not present

## 2019-02-13 LAB — GLUCOSE, CAPILLARY: Glucose-Capillary: 80 mg/dL (ref 70–99)

## 2019-02-13 MED ORDER — INSULIN ASPART 100 UNIT/ML ~~LOC~~ SOLN: 0.0000 [IU] | SUBCUTANEOUS | Status: DC

## 2019-02-13 MED ORDER — SODIUM CHLORIDE 0.9 % IV SOLN
INTRAVENOUS | Status: DC
  Administered 2019-02-13: 23:00:00 via INTRAVENOUS

## 2019-02-13 MED ORDER — INFLUENZA VAC A&B SA ADJ QUAD 0.5 ML IM PRSY
0.5000 mL | PREFILLED_SYRINGE | INTRAMUSCULAR | Status: AC
  Administered 2019-02-14: 0.5 mL via INTRAMUSCULAR
  Filled 2019-02-13: qty 0.5

## 2019-02-14 ENCOUNTER — Encounter (HOSPITAL_COMMUNITY): Payer: Self-pay | Admitting: Internal Medicine

## 2019-02-14 DIAGNOSIS — I4819 Other persistent atrial fibrillation: Secondary | ICD-10-CM

## 2019-02-14 DIAGNOSIS — K56609 Unspecified intestinal obstruction, unspecified as to partial versus complete obstruction: Principal | ICD-10-CM

## 2019-02-14 LAB — BASIC METABOLIC PANEL
Anion gap: 10 (ref 5–15)
Anion gap: 7 (ref 5–15)
BUN: 20 mg/dL (ref 8–23)
BUN: 20 mg/dL (ref 8–23)
CO2: 23 mmol/L (ref 22–32)
CO2: 24 mmol/L (ref 22–32)
Calcium: 8.5 mg/dL — ABNORMAL LOW (ref 8.9–10.3)
Calcium: 9.2 mg/dL (ref 8.9–10.3)
Chloride: 105 mmol/L (ref 98–111)
Chloride: 108 mmol/L (ref 98–111)
Creatinine, Ser: 0.92 mg/dL (ref 0.61–1.24)
Creatinine, Ser: 1.08 mg/dL (ref 0.61–1.24)
GFR calc Af Amer: >60 mL/min (ref >60)
GFR calc Af Amer: >60 mL/min (ref >60)
GFR calc non Af Amer: >60 mL/min (ref >60)
GFR calc non Af Amer: >60 mL/min (ref >60)
Glucose, Bld: 85 mg/dL (ref 70–99)
Glucose, Bld: 89 mg/dL (ref 70–99)
Potassium: 4.2 mmol/L (ref 3.5–5.1)
Potassium: 4.7 mmol/L (ref 3.5–5.1)
Sodium: 138 mmol/L (ref 135–145)
Sodium: 139 mmol/L (ref 135–145)

## 2019-02-14 LAB — HEPATIC FUNCTION PANEL
ALT: 15 U/L (ref 0–44)
AST: 22 U/L (ref 15–41)
Albumin: 3.7 g/dL (ref 3.5–5.0)
Alkaline Phosphatase: 40 U/L (ref 38–126)
Bilirubin, Direct: 0.2 mg/dL (ref 0.0–0.2)
Indirect Bilirubin: 0.7 mg/dL (ref 0.3–0.9)
Total Bilirubin: 0.9 mg/dL (ref 0.3–1.2)
Total Protein: 7.3 g/dL (ref 6.5–8.1)

## 2019-02-14 LAB — GLUCOSE, CAPILLARY
Glucose-Capillary: 73 mg/dL (ref 70–99)
Glucose-Capillary: 76 mg/dL (ref 70–99)
Glucose-Capillary: 94 mg/dL (ref 70–99)
Glucose-Capillary: 104 mg/dL — ABNORMAL HIGH (ref 70–99)
Glucose-Capillary: 113 mg/dL — ABNORMAL HIGH (ref 70–99)
Glucose-Capillary: 114 mg/dL — ABNORMAL HIGH (ref 70–99)

## 2019-02-14 LAB — CBC
HCT: 34.6 % — ABNORMAL LOW (ref 39.0–52.0)
Hemoglobin: 10.8 g/dL — ABNORMAL LOW (ref 13.0–17.0)
MCH: 29.6 pg (ref 26.0–34.0)
MCHC: 31.2 g/dL (ref 30.0–36.0)
MCV: 94.8 fL (ref 80.0–100.0)
Platelets: 174 10*3/uL (ref 150–400)
RBC: 3.65 MIL/uL — ABNORMAL LOW (ref 4.22–5.81)
RDW: 12.9 % (ref 11.5–15.5)
WBC: 6.1 10*3/uL (ref 4.0–10.5)
nRBC: 0 % (ref 0.0–0.2)

## 2019-02-14 LAB — CBC WITH DIFFERENTIAL/PLATELET
Abs Immature Granulocytes: 0.02 10*3/uL (ref 0.00–0.07)
Basophils Absolute: 0 10*3/uL (ref 0.0–0.1)
Basophils Relative: 0 %
Eosinophils Absolute: 0 10*3/uL (ref 0.0–0.5)
Eosinophils Relative: 1 %
HCT: 39 % (ref 39.0–52.0)
Hemoglobin: 12.5 g/dL — ABNORMAL LOW (ref 13.0–17.0)
Immature Granulocytes: 0 %
Lymphocytes Relative: 31 %
Lymphs Abs: 2.1 10*3/uL (ref 0.7–4.0)
MCH: 30.3 pg (ref 26.0–34.0)
MCHC: 32.1 g/dL (ref 30.0–36.0)
MCV: 94.4 fL (ref 80.0–100.0)
Monocytes Absolute: 0.6 10*3/uL (ref 0.1–1.0)
Monocytes Relative: 10 %
Neutro Abs: 3.9 10*3/uL (ref 1.7–7.7)
Neutrophils Relative %: 58 %
Platelets: 188 10*3/uL (ref 150–400)
RBC: 4.13 MIL/uL — ABNORMAL LOW (ref 4.22–5.81)
RDW: 12.9 % (ref 11.5–15.5)
WBC: 6.7 10*3/uL (ref 4.0–10.5)
nRBC: 0 % (ref 0.0–0.2)

## 2019-02-14 LAB — C-REACTIVE PROTEIN: CRP: 10.2 mg/dL — ABNORMAL HIGH (ref <1.0)

## 2019-02-14 LAB — HEMOGLOBIN A1C
Hgb A1c MFr Bld: 5.7 % — ABNORMAL HIGH (ref 4.8–5.6)
Mean Plasma Glucose: 116.89 mg/dL

## 2019-02-14 LAB — PROTIME-INR
INR: 2.8 — ABNORMAL HIGH (ref 0.8–1.2)
Prothrombin Time: 29.3 seconds — ABNORMAL HIGH (ref 11.4–15.2)

## 2019-02-14 LAB — LACTIC ACID, PLASMA: Lactic Acid, Venous: 1 mmol/L (ref 0.5–1.9)

## 2019-02-14 LAB — FERRITIN: Ferritin: 223 ng/mL (ref 24–336)

## 2019-02-14 LAB — DIGOXIN LEVEL: Digoxin Level: 0.4 ng/mL — ABNORMAL LOW (ref 0.8–2.0)

## 2019-02-14 MED ORDER — ONDANSETRON HCL 4 MG PO TABS: 4.0000 mg | ORAL_TABLET | Freq: Four times a day (QID) | ORAL | Status: DC | PRN

## 2019-02-14 MED ORDER — INSULIN ASPART 100 UNIT/ML ~~LOC~~ SOLN
0.0000 [IU] | Freq: Three times a day (TID) | SUBCUTANEOUS | Status: DC
  Administered 2019-02-15: 1 [IU] via SUBCUTANEOUS
  Administered 2019-02-15: 2 [IU] via SUBCUTANEOUS
  Administered 2019-02-15: 1 [IU] via SUBCUTANEOUS
  Administered 2019-02-16 – 2019-02-17 (×2): 2 [IU] via SUBCUTANEOUS
  Administered 2019-02-17: 1 [IU] via SUBCUTANEOUS

## 2019-02-14 MED ORDER — ACETAMINOPHEN 325 MG PO TABS
650.0000 mg | ORAL_TABLET | Freq: Four times a day (QID) | ORAL | Status: DC | PRN
  Administered 2019-02-15 – 2019-02-16 (×2): 650 mg via ORAL
  Filled 2019-02-14 (×3): qty 2

## 2019-02-14 MED ORDER — LEVOTHYROXINE SODIUM 100 MCG/5ML IV SOLN
25.0000 ug | Freq: Every day | INTRAVENOUS | Status: DC
  Administered 2019-02-14 – 2019-02-15 (×2): 25 ug via INTRAVENOUS
  Filled 2019-02-14 (×2): qty 5

## 2019-02-14 MED ORDER — ACETAMINOPHEN 650 MG RE SUPP: 650.0000 mg | Freq: Four times a day (QID) | RECTAL | Status: DC | PRN

## 2019-02-14 MED ORDER — ONDANSETRON HCL 4 MG/2ML IJ SOLN: 4.0000 mg | Freq: Four times a day (QID) | INTRAMUSCULAR | Status: DC | PRN

## 2019-02-14 MED ORDER — DEXTROSE-NACL 5-0.9 % IV SOLN
INTRAVENOUS | Status: DC
  Administered 2019-02-14 – 2019-02-17 (×4): via INTRAVENOUS

## 2019-02-14 MED ORDER — MORPHINE SULFATE (PF) 2 MG/ML IV SOLN
1.0000 mg | INTRAVENOUS | Status: DC | PRN
  Administered 2019-02-15: 1 mg via INTRAVENOUS
  Filled 2019-02-14 (×2): qty 1
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
## 2019-02-14 NOTE — TOC Progression Note (Signed)
Transition of Care [facility]) - Progression Note    Patient Details  Name: Mitchell Murray MRN: [id number] Date of Birth: [birth year]  [facility] of [facility]) CM/SW Contact  Purcell Mouton, RN Phone Number: 02/14/2019, 12:11 PM  Clinical Narrative:    Pt states that he will not need Home Health at discharge. Will continue to follow.    Expected Discharge Plan: Home/Self Care    Expected Discharge Plan and Services Expected Discharge Plan: Home/Self Care       Living arrangements for the past 2 months: Single Family Home                                       Social Determinants of Health (SDOH) Interventions    Readmission Risk Interventions No flowsheet data found.

## 2019-02-14 NOTE — Progress Notes (Signed)
Responded to spiritual care consult for an AD. I talked with Pt's Nurse Margreta Journey, She said will give an AD to Pt when she goes into room.   Chaplain Resident Fidel Levy  [phone]

## 2019-02-14 NOTE — Progress Notes (Signed)
Patient admitted after midnight, please see earlier detailed admission note by Gean Birchwood. Briefly, patient presented secondary to evidence of SBO at OSH. Patient is incidentally COVID-19 positive but asymptomatic. General surgery consulted. Patient's symptoms improved and now transitioned to oral diet of clear liquids.   Cordelia Poche, MD [name] 02/14/2019, 12:26 PM

## 2019-02-14 NOTE — Consult Note (Signed)
Mitchell Murray [birth year]  VA:[id number].    Requesting MD: Dr. Hal Hope Chief Complaint/Reason for Consult: sbo  HPI:  This is a 78yo white male who is COVID + with a history of pacemaker/AICD, CAD, MI, CHF, and HTN who began having some abdominal pain several days ago.  He denies any N/V.  Last had a BM 2 days ago.  He went ot [facility] yesterday secondary to this abdominal pain.  By report, as I do not have the CD or imaging, he was found to have a SBO; however, he was already feeling better in the ED so no NGT was placed.  He denies any CP, SOB, fevers, cough, etc.  He was transferred from [facility] to [facility] due to his COVID status.  He arrived overnight here and had repeat films which show no evidence of a SBO.  He is passing flatus this morning.  We have been asked to see him from [facility] for evaluation.  ROS: ROS: Please see HPI, otherwise all other systems reviewed and are negative.  Family History  Problem Relation Age of Onset  . Heart failure Mother   . Cancer Father   . Heart failure Sister     Past Medical History:  Diagnosis Date  . Automatic implantable cardioverter-defibrillator in situ   . Cardiomegaly   . CHF (congestive heart failure) ([place])   . Coronary artery disease   . Dysrhythmia    atrial fib  . GERD (gastroesophageal reflux disease)   . Hard of hearing   . Hyperlipidemia   . Hypertension   . Hypothyroidism   . Kidney stones   . MI (myocardial infarction) ([place])   . Pacemaker   . Prostate cancer [facility])     Past Surgical History:  Procedure Laterality Date  . CARDIAC DEFIBRILLATOR PLACEMENT    . CYSTOSCOPY WITH LITHOLAPAXY N/A 01/24/2013   Procedure: CYSTOSCOPY WITH LITHOLAPAXY;  Surgeon: Ardis Hughs, MD;  Location: [facility] ORS;  Service: Urology;  Laterality: N/A;  . HERNIA REPAIR    . HOLMIUM LASER APPLICATION N/A [id number]   Procedure: HOLMIUM LASER APPLICATION;  Surgeon: Ardis Hughs, MD;  Location: [facility]S;  Service: Urology;  Laterality: N/A;  .  PACEMAKER INSERTION    . PROSTATE SURGERY    . PROSTATECTOMY      Social History:  reports that he has never smoked. He has never used smokeless tobacco. He reports that he does not drink alcohol or use drugs.  Allergies:  Allergies  Allergen Reactions  . Ivp Dye [Iodinated Diagnostic Agents] Anaphylaxis  . Metrizamide Anaphylaxis    Medications Prior to Admission  Medication Sig Dispense Refill  . atorvastatin (LIPITOR) 20 MG tablet Take 20 mg by mouth every evening.     . digoxin (LANOXIN) 0.125 MG tablet Take 0.125 mg by mouth daily.    Marland Kitchen levothyroxine (SYNTHROID, LEVOTHROID) 50 MCG tablet Take 50 mcg by mouth daily before breakfast.    . meclizine (ANTIVERT) 25 MG tablet Take 12.5 mg by mouth 3 (three) times daily as needed for dizziness.     . metFORMIN (GLUCOPHAGE) 1000 MG tablet Take 1,000 mg by mouth 2 (two) times daily.    . metoprolol succinate (TOPROL-XL) 50 MG 24 hr tablet Take 75 mg by mouth daily. Take with or immediately following a meal.     . Omega-3 1000 MG CAPS Take 1,000 mg by mouth daily.     Marland Kitchen omeprazole (PRILOSEC) 40 MG capsule Take 40 mg by mouth  daily as needed (Heart Burn).     . sertraline (ZOLOFT) 25 MG tablet Take 25 mg by mouth daily as needed. Depression    . spironolactone (ALDACTONE) 25 MG tablet Take 25 mg by mouth daily.    Marland Kitchen warfarin (COUMADIN) 4 MG tablet Take 2-4 mg by mouth See admin instructions. Tuesday and Thursday 2mg ; Monday Wed Friday Sat Sun 4mg     . oxyCODONE-acetaminophen (PERCOCET) 5-325 MG tablet Take 1 tablet by mouth every 4 (four) hours as needed for severe pain. (Patient not taking: Reported on 02/13/2019) 10 tablet 0  . tamsulosin (FLOMAX) 0.4 MG CAPS capsule Take 1 capsule (0.4 mg total) by mouth daily. (Patient not taking: Reported on 01/21/2018) 30 capsule 0     Physical Exam: Blood pressure (!) 141/70, pulse 78, temperature 99.1 F (37.3 C), temperature source Oral, resp. rate (!) 22, height 5\' 10"  (1.778 m), weight 63.4 kg,  SpO2 100 %. General: pleasant, WD, WN white male who is laying in bed in NAD HEENT: head is normocephalic, atraumatic.  Sclera are noninjected.  PERRL.  Ears and nose without any masses or lesions.  He does have [place] cannula present, but sats are good.  Mouth is pink and moist Heart: regular, rate, and rhythm.  Normal s1,s2. No obvious murmurs, gallops, or rubs noted.  Palpable radial and pedal pulses bilaterally Lungs: CTAB, no wheezes, rhonchi, or rales noted.  Respiratory effort nonlabored Abd: soft, NT, ND, +BS, no masses, hernias, or organomegaly MS: all 4 extremities are symmetrical with no cyanosis, clubbing, or edema. Skin: warm and dry with no masses, lesions, or rashes Psych: A&Ox3 with an appropriate affect.   Results for orders placed or performed during the hospital encounter of 02/13/19 (from the past 48 hour(s))  Glucose, capillary     Status: None   Collection Time: 02/13/19 11:37 PM  Result Value Ref Range   Glucose-Capillary 80 70 - 99 mg/dL  Basic metabolic panel     Status: None   Collection Time: 02/13/19 11:54 PM  Result Value Ref Range   Sodium 139 135 - 145 mmol/L   Potassium 4.7 3.5 - 5.1 mmol/L   Chloride 105 98 - 111 mmol/L   CO2 24 22 - 32 mmol/L   Glucose, Bld 89 70 - 99 mg/dL   BUN 20 8 - 23 mg/dL   Creatinine, Ser 1.08 0.61 - 1.24 mg/dL   Calcium 9.2 8.9 - 10.3 mg/dL   GFR calc non Af Amer >60 >60 mL/min   GFR calc Af Amer >60 >60 mL/min   Anion gap 10 5 - 15    Comment: Performed at [facility], [place] [address], [place], [place] [address]  Hepatic function panel     Status: None   Collection Time: 02/13/19 11:54 PM  Result Value Ref Range   Total Protein 7.3 6.5 - 8.1 g/dL   Albumin 3.7 3.5 - 5.0 g/dL   AST 22 15 - 41 U/L   ALT 15 0 - 44 U/L   Alkaline Phosphatase 40 38 - 126 U/L   Total Bilirubin 0.9 0.3 - 1.2 mg/dL   Bilirubin, Direct 0.2 0.0 - 0.2 mg/dL   Indirect Bilirubin 0.7 0.3 - 0.9 mg/dL    Comment: Performed at  [facility], [place] [address], [place], [place] [address]  CBC with Differential/Platelet     Status: Abnormal   Collection Time: 02/13/19 11:54 PM  Result Value Ref Range   WBC 6.7 4.0 - 10.5  K/uL   RBC 4.13 (L) 4.22 - 5.81 MIL/uL   Hemoglobin 12.5 (L) 13.0 - 17.0 g/dL   HCT 39.0 39.0 - 52.0 %   MCV 94.4 80.0 - 100.0 fL   MCH 30.3 26.0 - 34.0 pg   MCHC 32.1 30.0 - 36.0 g/dL   RDW 12.9 11.5 - 15.5 %   Platelets 188 150 - 400 K/uL   nRBC 0.0 0.0 - 0.2 %   Neutrophils Relative % 58 %   Neutro Abs 3.9 1.7 - 7.7 K/uL   Lymphocytes Relative 31 %   Lymphs Abs 2.1 0.7 - 4.0 K/uL   Monocytes Relative 10 %   Monocytes Absolute 0.6 0.1 - 1.0 K/uL   Eosinophils Relative 1 %   Eosinophils Absolute 0.0 0.0 - 0.5 K/uL   Basophils Relative 0 %   Basophils Absolute 0.0 0.0 - 0.1 K/uL   Immature Granulocytes 0 %   Abs Immature Granulocytes 0.02 0.00 - 0.07 K/uL    Comment: Performed at [facility], [place] [address], [place], [place] [address]  Protime-INR     Status: Abnormal   Collection Time: 02/13/19 11:54 PM  Result Value Ref Range   Prothrombin Time 29.3 (H) 11.4 - 15.2 seconds   INR 2.8 (H) 0.8 - 1.2    Comment: (NOTE) INR goal varies based on device and disease states. Performed at [facility], [place] [address], [place], [place] [address]   Lactic acid, plasma     Status: None   Collection Time: 02/13/19 11:54 PM  Result Value Ref Range   Lactic Acid, Venous 1.0 0.5 - 1.9 mmol/L    Comment: Performed at [facility], [place] [address] [place], [place] [address]  Digoxin level     Status: Abnormal   Collection Time: 02/13/19 11:54 PM  Result Value Ref Range   Digoxin Level 0.4 (L) 0.8 - 2.0 ng/mL    Comment: Performed at [facility], [place] [address] [place], [place] [address]  C-reactive protein     Status: Abnormal   Collection Time: 02/13/19 11:54 PM  Result Value Ref Range   CRP 10.2  (H) <1.0 mg/dL    Comment: Performed at [facility], [place] [address], [place], [place] [address]  Ferritin     Status: None   Collection Time: 02/13/19 11:54 PM  Result Value Ref Range   Ferritin 223 24 - 336 ng/mL    Comment: Performed at [facility], [place] [address], [place], [place] [address]  Hemoglobin A1c     Status: Abnormal   Collection Time: 02/13/19 11:54 PM  Result Value Ref Range   Hgb A1c MFr Bld 5.7 (H) 4.8 - 5.6 %    Comment: (NOTE) Pre diabetes:          5.7%-6.4% Diabetes:              >6.4% Glycemic control for   <7.0% adults with diabetes    Mean Plasma Glucose 116.89 mg/dL    Comment: Performed at [place] [address] [place], [place] [address]  Glucose, capillary     Status: None   Collection Time: 02/14/19  2:59 AM  Result Value Ref Range   Glucose-Capillary 73 70 - 99 mg/dL  Basic metabolic panel     Status: Abnormal   Collection Time: 02/14/19  4:14 AM  Result Value Ref Range   Sodium 138 135 - 145 mmol/L  Potassium 4.2 3.5 - 5.1 mmol/L   Chloride 108 98 - 111 mmol/L   CO2 23 22 - 32 mmol/L   Glucose, Bld 85 70 - 99 mg/dL   BUN 20 8 - 23 mg/dL   Creatinine, Ser 0.92 0.61 - 1.24 mg/dL   Calcium 8.5 (L) 8.9 - 10.3 mg/dL   GFR calc non Af Amer >60 >60 mL/min   GFR calc Af Amer >60 >60 mL/min   Anion gap 7 5 - 15    Comment: Performed at [facility], [place] [address] [place], [place] [address]  CBC     Status: Abnormal   Collection Time: 02/14/19  4:14 AM  Result Value Ref Range   WBC 6.1 4.0 - 10.5 K/uL   RBC 3.65 (L) 4.22 - 5.81 MIL/uL   Hemoglobin 10.8 (L) 13.0 - 17.0 g/dL   HCT 34.6 (L) 39.0 - 52.0 %   MCV 94.8 80.0 - 100.0 fL   MCH 29.6 26.0 - 34.0 pg   MCHC 31.2 30.0 - 36.0 g/dL   RDW 12.9 11.5 - 15.5 %   Platelets 174 150 - 400 K/uL   nRBC 0.0 0.0 - 0.2 %    Comment: Performed at [facility], [place] [address], [place], [place] [address]  Glucose,  capillary     Status: None   Collection Time: 02/14/19  6:13 AM  Result Value Ref Range   Glucose-Capillary 76 70 - 99 mg/dL  Glucose, capillary     Status: None   Collection Time: 02/14/19  8:14 AM  Result Value Ref Range   Glucose-Capillary 94 70 - 99 mg/dL   Dg Abd Acute 2+v W 1v Chest  Result Date: 02/14/2019 CLINICAL DATA:  Bowel obstruction EXAM: DG ABDOMEN ACUTE W/ 1V CHEST COMPARISON:  12/15/2013, 01/21/2018, CT 02/13/2019 FINDINGS: Single-view chest demonstrates left-sided pacing device. Mild cardiomegaly. Trace left pleural effusion or thickening. No consolidation. Aortic atherosclerosis. Supine and decubitus views of the abdomen demonstrate no free air. Decreased small bowel distension within the central abdomen with few residual air filled distended central small bowel loops up to 3.5 cm. Increased distal bowel gas. IMPRESSION: 1. No radiographic evidence for acute cardiopulmonary abnormality 2. Decreased amount of small bowel distension when compared to scout image of CT. Electronically Signed   By: Donavan Foil M.D.   On: 02/14/2019 00:04      Assessment/Plan HTN CHF H/O MI CAD, pacemaker/AICD A fib - on coumadin COVID-19 +  Abdominal pain The patient potentially had a SBO on his initial imaging that I can't see in our system; however, his pain quickly resolved and he is passing flatus with no N/V at all.  He has had prior intra-abdominal surgery so SBO can't be rule out, but clinically and radiographically the patient is resolving.  His films this morning show air and stool throughout the colon.  He is passing flatus and has no abdominal pain with no nausea or vomiting.  He will be placed on clear liquids and if tolerates his diet can be advanced as tolerated.  No plans for acute surgical intervention at this time.  We will follow.  FEN - CLD VTE - heparin ID - none needed  Henreitta Cea, [facility] 02/14/2019, 10:48 AM Please see Amion for  pager number during day hours 7:00am-4:30pm

## 2019-02-15 ENCOUNTER — Other Ambulatory Visit: Payer: Self-pay

## 2019-02-15 DIAGNOSIS — Z95810 Presence of automatic (implantable) cardiac defibrillator: Secondary | ICD-10-CM

## 2019-02-15 DIAGNOSIS — I10 Essential (primary) hypertension: Secondary | ICD-10-CM

## 2019-02-15 LAB — GLUCOSE, CAPILLARY
Glucose-Capillary: 128 mg/dL — ABNORMAL HIGH (ref 70–99)
Glucose-Capillary: 132 mg/dL — ABNORMAL HIGH (ref 70–99)
Glucose-Capillary: 139 mg/dL — ABNORMAL HIGH (ref 70–99)
Glucose-Capillary: 146 mg/dL — ABNORMAL HIGH (ref 70–99)
Glucose-Capillary: 154 mg/dL — ABNORMAL HIGH (ref 70–99)
Glucose-Capillary: 208 mg/dL — ABNORMAL HIGH (ref 70–99)

## 2019-02-15 LAB — CBC
HCT: 34.9 % — ABNORMAL LOW (ref 39.0–52.0)
Hemoglobin: 11.1 g/dL — ABNORMAL LOW (ref 13.0–17.0)
MCH: 29.6 pg (ref 26.0–34.0)
MCHC: 31.8 g/dL (ref 30.0–36.0)
MCV: 93.1 fL (ref 80.0–100.0)
Platelets: 199 10*3/uL (ref 150–400)
RBC: 3.75 MIL/uL — ABNORMAL LOW (ref 4.22–5.81)
RDW: 12.5 % (ref 11.5–15.5)
WBC: 5 10*3/uL (ref 4.0–10.5)
nRBC: 0 % (ref 0.0–0.2)

## 2019-02-15 LAB — PROTIME-INR
INR: 3.5 — ABNORMAL HIGH (ref 0.8–1.2)
Prothrombin Time: 34.9 seconds — ABNORMAL HIGH (ref 11.4–15.2)

## 2019-02-15 MED ORDER — ALUM & MAG HYDROXIDE-SIMETH 200-200-20 MG/5ML PO SUSP
15.0000 mL | Freq: Four times a day (QID) | ORAL | Status: DC | PRN
  Administered 2019-02-15 (×2): 15 mL via ORAL
  Filled 2019-02-15 (×3): qty 30

## 2019-02-15 MED ORDER — METOPROLOL SUCCINATE ER 50 MG PO TB24
75.0000 mg | ORAL_TABLET | Freq: Every day | ORAL | Status: DC
  Administered 2019-02-15 – 2019-02-17 (×3): 75 mg via ORAL
  Filled 2019-02-15 (×4): qty 1

## 2019-02-15 MED ORDER — LEVOTHYROXINE SODIUM 50 MCG PO TABS
50.0000 ug | ORAL_TABLET | Freq: Every day | ORAL | Status: DC
  Administered 2019-02-16 – 2019-02-17 (×2): 50 ug via ORAL
  Filled 2019-02-15 (×2): qty 1

## 2019-02-15 MED ORDER — DIGOXIN 125 MCG PO TABS
0.1250 mg | ORAL_TABLET | Freq: Every day | ORAL | Status: DC
  Administered 2019-02-15 – 2019-02-17 (×3): 0.125 mg via ORAL
  Filled 2019-02-15 (×3): qty 1

## 2019-02-15 NOTE — Progress Notes (Addendum)
Pharmacy - IV heparin  Assessment: 40 yoM on chronic warfarin for Afib admitted with SBO. Pharmacy consulted to dose IV heparin once INR falls below 2.0  Today, 02/15/2019:  Hgb low but stable, Plt stable WNL  INR increased 2.8 >> 3.5 despite holding warfarin (not unsurprising given acute illness and reduced PO intake)  Plan:  Daily INR  Start heparin once appropriate INR achieved; no indication for reversal at this time  Reuel Boom, PharmD, BCPS [phone] 02/15/2019, 10:20 AM

## 2019-02-15 NOTE — Progress Notes (Signed)
PROGRESS NOTE    Mitchell Murray  [id number] DOB: November 04, 1940 DOA: 02/13/2019 PCP: Ocie Doyne., MD   Brief Narrative: Mitchell Murray is a 78 y.o. male with history of nonischemic cardiomyopathy status post AICD placement, history of stroke, history of A. fib on warfarin, history of nonobstructive CAD, hypothyroidism, diabetes mellitus. He presented secondary to abdominal pain and found to have a bowel obstruction and incidentally found to be COVID-19 positive   Assessment & Plan:   Principal Problem:   SBO (small bowel obstruction) (HCC) Active Problems:   CAD (coronary artery disease)   AICD (automatic cardioverter/defibrillator) present   A-fib [facility])   Prostate cancer ([place])   Hypothyroidism   Essential hypertension   Chronic systolic congestive heart failure ([place])   SBO Concern for SBO seen on CT scan from OSH. Complicated by [id number] infection. General surgery on board. No bowel movements but passing gas. -General surgery recommendations: Advancing diet slowly  COVID-19 infection Symptomatic with cough. No dyspnea. Patient is on oxygen but no documented hypoxia. Chest x-ray clear. -Remove oxygen, watch pulse ox  History of atrial fibrillation -Continue metoprolol, digoxin -Coumadin per pharmacy once clear on patient's course; heparin for INR <2  Diabetes mellitus, type 2 -Continue SSI  Anemia Stable. Mild. Outpatient follow-up  Hypothyroidism -Continue Synthroid  Prostate cancer S/p prostatectomy.     DVT prophylaxis: SCDs Code Status:   Code Status: DNR Family Communication: None at bedside Disposition Plan: Discharge possibly home when cleared by general surgery and pending PT recommendations   Consultants:   General surgery  Procedures:   None  Antimicrobials:  None    Subjective: No issues. Passing gas. No bowel movement.  Objective: Vitals:   02/14/19 2020 02/15/19 0503 02/15/19 0516 02/15/19 1338  BP: (!) 144/85 136/67   116/77  Pulse: 77 78  80  Resp: 16 20  18   Temp: 99.1 F (37.3 C) 99.5 F (37.5 C)  99.5 F (37.5 C)  TempSrc: Oral Oral  Oral  SpO2: 100% 99%  100%  Weight:   62.7 kg   Height:        Intake/Output Summary (Last 24 hours) at 02/15/2019 1401 Last data filed at 02/15/2019 1000 Gross per 24 hour  Intake 2505.61 ml  Output 750 ml  Net 1755.61 ml   Filed Weights   02/13/19 2022 02/14/19 0558 02/15/19 0516  Weight: 64.9 kg 63.4 kg 62.7 kg    Examination:  General exam: Appears calm and comfortable Respiratory system: Clear to auscultation. Respiratory effort normal. Cardiovascular system: S1 & S2 heard, RRR. No murmurs, rubs, gallops or clicks. Gastrointestinal system: Abdomen is distended, soft and nontender. No organomegaly or masses felt. Normal bowel sounds heard. Central nervous system: Alert and oriented. No focal neurological deficits. Extremities: No edema. No calf tenderness Skin: No cyanosis. No rashes Psychiatry: Judgement and insight appear normal. Mood & affect appropriate.     Data Reviewed: I have personally reviewed following labs and imaging studies  CBC: Recent Labs  Lab 02/13/19 2354 02/14/19 0414 02/15/19 0444  WBC 6.7 6.1 5.0  NEUTROABS 3.9  --   --   HGB 12.5* 10.8* 11.1*  HCT 39.0 34.6* 34.9*  MCV 94.4 94.8 93.1  PLT 188 174 [id number]   Basic Metabolic Panel: Recent Labs  Lab 02/13/19 2354 02/14/19 0414  NA 139 138  K 4.7 4.2  CL 105 108  CO2 24 23  GLUCOSE 89 85  BUN 20 20  CREATININE 1.08 0.92  CALCIUM 9.2 8.5*  GFR: Estimated Creatinine Clearance: 58.7 mL/min (by C-G formula based on SCr of 0.92 mg/dL). Liver Function Tests: Recent Labs  Lab 02/13/19 2354  AST 22  ALT 15  ALKPHOS 40  BILITOT 0.9  PROT 7.3  ALBUMIN 3.7   No results for input(s): LIPASE, AMYLASE in the last 168 hours. No results for input(s): AMMONIA in the last 168 hours. Coagulation Profile: Recent Labs  Lab 02/13/19 2354 02/15/19 0444  INR 2.8*  3.5*   Cardiac Enzymes: No results for input(s): CKTOTAL, CKMB, CKMBINDEX, TROPONINI in the last 168 hours. BNP (last 3 results) No results for input(s): PROBNP in the last 8760 hours. HbA1C: Recent Labs    02/13/19 2354  HGBA1C 5.7*   CBG: Recent Labs  Lab 02/14/19 2014 02/15/19 0012 02/15/19 0545 02/15/19 0734 02/15/19 1213  GLUCAP 114* 146* 139* 132* 154*   Lipid Profile: No results for input(s): CHOL, HDL, LDLCALC, TRIG, CHOLHDL, LDLDIRECT in the last 72 hours. Thyroid Function Tests: No results for input(s): TSH, T4TOTAL, FREET4, T3FREE, THYROIDAB in the last 72 hours. Anemia Panel: Recent Labs    02/13/19 2354  FERRITIN 223   Sepsis Labs: Recent Labs  Lab 02/13/19 2354  LATICACIDVEN 1.0    No results found for this or any previous visit (from the past 240 hour(s)).       Radiology Studies: Dg Abd Acute 2+v W 1v Chest  Result Date: 02/14/2019 CLINICAL DATA:  Bowel obstruction EXAM: DG ABDOMEN ACUTE W/ 1V CHEST COMPARISON:  12/15/2013, 01/21/2018, CT 02/13/2019 FINDINGS: Single-view chest demonstrates left-sided pacing device. Mild cardiomegaly. Trace left pleural effusion or thickening. No consolidation. Aortic atherosclerosis. Supine and decubitus views of the abdomen demonstrate no free air. Decreased small bowel distension within the central abdomen with few residual air filled distended central small bowel loops up to 3.5 cm. Increased distal bowel gas. IMPRESSION: 1. No radiographic evidence for acute cardiopulmonary abnormality 2. Decreased amount of small bowel distension when compared to scout image of CT. Electronically Signed   By: Donavan Foil M.D.   On: 02/14/2019 00:04        Scheduled Meds: . insulin aspart  0-9 Units Subcutaneous TID WC  . levothyroxine  25 mcg Intravenous Daily   Continuous Infusions: . dextrose 5 % and 0.9% NaCl 75 mL/hr at 02/14/19 2102     LOS: 2 days     Cordelia Poche, MD [name] 02/15/2019,  2:01 PM  If 7PM-7AM, please contact night-coverage [email or web address]

## 2019-02-15 NOTE — Progress Notes (Addendum)
[place] Office:  [phone] General Surgery Progress Note   LOS: 2 days  POD -     Chief Complaint: Abdominal pain  Assessment and Plan: 1.  SBO  Tolerating liquid diet - though no BM for several days.  He is passing flatus.  2.  Covid positive  Asymptomatic 3.  CAD  History of pacemaker/AICD 4.  A. Fib 5.  Anticoagulated  PT/INR - 34/3.5  Coumadin on hold 5.  HTN 6.  DM 7.  History of prostate cancer and prostatectomy 8.  Tremor  9.  DVT prophylaxis - on Coumadin   Principal Problem:   SBO (small bowel obstruction) (HCC) Active Problems:   CAD (coronary artery disease)   AICD (automatic cardioverter/defibrillator) present   A-fib [facility])   Prostate cancer (HCC)   Hypothyroidism   Essential hypertension   Chronic systolic congestive heart failure (HCC)   Subjective:  Doing well.  tolerating diet.  No BM for several days.  Objective:   Vitals:   02/14/19 2020 02/15/19 0503  BP: (!) 144/85 136/67  Pulse: 77 78  Resp: 16 20  Temp: 99.1 F (37.3 C) 99.5 F (37.5 C)  SpO2: 100% 99%     Intake/Output from previous day:  10/16 0701 - 10/17 0700 In: 1934.3 [P.O.:240; I.V.:1694.3] Out: 750 [Urine:750]  Intake/Output this shift:  Total I/O In: 240 [P.O.:240] Out: -    Physical Exam:   General: Older WM  who is alert and oriented.  He has a noticeable tremor   HEENT: Normal. Pupils equal. .   Lungs: Clear   Abdomen: Mild distention with few BS.  No localized tenderness.   Lab Results:    Recent Labs    02/14/19 0414 02/15/19 0444  WBC 6.1 5.0  HGB 10.8* 11.1*  HCT 34.6* 34.9*  PLT 174 199    BMET   Recent Labs    02/13/19 2354 02/14/19 0414  NA 139 138  K 4.7 4.2  CL 105 108  CO2 24 23  GLUCOSE 89 85  BUN 20 20  CREATININE 1.08 0.92  CALCIUM 9.2 8.5*    PT/INR   Recent Labs    02/13/19 2354 02/15/19 0444  LABPROT 29.3* 34.9*  INR 2.8* 3.5*    ABG  No results for input(s): PHART, HCO3 in the last 72  hours.  Invalid input(s): PCO2, PO2   Studies/Results:  Dg Abd Acute 2+v W 1v Chest  Result Date: 02/14/2019 CLINICAL DATA:  Bowel obstruction EXAM: DG ABDOMEN ACUTE W/ 1V CHEST COMPARISON:  12/15/2013, 01/21/2018, CT 02/13/2019 FINDINGS: Single-view chest demonstrates left-sided pacing device. Mild cardiomegaly. Trace left pleural effusion or thickening. No consolidation. Aortic atherosclerosis. Supine and decubitus views of the abdomen demonstrate no free air. Decreased small bowel distension within the central abdomen with few residual air filled distended central small bowel loops up to 3.5 cm. Increased distal bowel gas. IMPRESSION: 1. No radiographic evidence for acute cardiopulmonary abnormality 2. Decreased amount of small bowel distension when compared to scout image of CT. Electronically Signed   By: Donavan Foil M.D.   On: 02/14/2019 00:04     Anti-infectives:   Anti-infectives (From admission, onward)   None      Alphonsa Overall, MD, [facility] Surgery Office: [phone] 02/15/2019

## 2019-02-16 LAB — CBC WITH DIFFERENTIAL/PLATELET
Abs Immature Granulocytes: 0.02 10*3/uL (ref 0.00–0.07)
Basophils Absolute: 0 10*3/uL (ref 0.0–0.1)
Basophils Relative: 0 %
Eosinophils Absolute: 0 10*3/uL (ref 0.0–0.5)
Eosinophils Relative: 0 %
HCT: 32.4 % — ABNORMAL LOW (ref 39.0–52.0)
Hemoglobin: 10.4 g/dL — ABNORMAL LOW (ref 13.0–17.0)
Immature Granulocytes: 0 %
Lymphocytes Relative: 14 %
Lymphs Abs: 0.7 10*3/uL (ref 0.7–4.0)
MCH: 29.5 pg (ref 26.0–34.0)
MCHC: 32.1 g/dL (ref 30.0–36.0)
MCV: 92 fL (ref 80.0–100.0)
Monocytes Absolute: 0.6 10*3/uL (ref 0.1–1.0)
Monocytes Relative: 13 %
Neutro Abs: 3.5 10*3/uL (ref 1.7–7.7)
Neutrophils Relative %: 73 %
Platelets: 201 10*3/uL (ref 150–400)
RBC: 3.52 MIL/uL — ABNORMAL LOW (ref 4.22–5.81)
RDW: 12.8 % (ref 11.5–15.5)
WBC: 4.8 10*3/uL (ref 4.0–10.5)
nRBC: 0 % (ref 0.0–0.2)

## 2019-02-16 LAB — COMPREHENSIVE METABOLIC PANEL
ALT: 20 U/L (ref 0–44)
AST: 28 U/L (ref 15–41)
Albumin: 3 g/dL — ABNORMAL LOW (ref 3.5–5.0)
Alkaline Phosphatase: 43 U/L (ref 38–126)
Anion gap: 8 (ref 5–15)
BUN: 11 mg/dL (ref 8–23)
CO2: 22 mmol/L (ref 22–32)
Calcium: 8.3 mg/dL — ABNORMAL LOW (ref 8.9–10.3)
Chloride: 108 mmol/L (ref 98–111)
Creatinine, Ser: 1.06 mg/dL (ref 0.61–1.24)
GFR calc Af Amer: >60 mL/min (ref >60)
GFR calc non Af Amer: >60 mL/min (ref >60)
Glucose, Bld: 125 mg/dL — ABNORMAL HIGH (ref 70–99)
Potassium: 4.2 mmol/L (ref 3.5–5.1)
Sodium: 138 mmol/L (ref 135–145)
Total Bilirubin: 1.9 mg/dL — ABNORMAL HIGH (ref 0.3–1.2)
Total Protein: 6.4 g/dL — ABNORMAL LOW (ref 6.5–8.1)

## 2019-02-16 LAB — GLUCOSE, CAPILLARY
Glucose-Capillary: 113 mg/dL — ABNORMAL HIGH (ref 70–99)
Glucose-Capillary: 127 mg/dL — ABNORMAL HIGH (ref 70–99)
Glucose-Capillary: 133 mg/dL — ABNORMAL HIGH (ref 70–99)
Glucose-Capillary: 158 mg/dL — ABNORMAL HIGH (ref 70–99)
Glucose-Capillary: 267 mg/dL — ABNORMAL HIGH (ref 70–99)
Glucose-Capillary: 91 mg/dL (ref 70–99)

## 2019-02-16 LAB — C-REACTIVE PROTEIN: CRP: 15.6 mg/dL — ABNORMAL HIGH (ref <1.0)

## 2019-02-16 LAB — D-DIMER, QUANTITATIVE: D-Dimer, Quant: 0.51 ug/mL-FEU — ABNORMAL HIGH (ref 0.00–0.50)

## 2019-02-16 LAB — ABO/RH: ABO/RH(D): A POS

## 2019-02-16 LAB — PROTIME-INR
INR: 3 — ABNORMAL HIGH (ref 0.8–1.2)
Prothrombin Time: 30.7 seconds — ABNORMAL HIGH (ref 11.4–15.2)

## 2019-02-16 LAB — FERRITIN: Ferritin: 360 ng/mL — ABNORMAL HIGH (ref 24–336)

## 2019-02-16 MED ORDER — METHYLPREDNISOLONE SODIUM SUCC 40 MG IJ SOLR
0.5000 mg/kg | Freq: Two times a day (BID) | INTRAMUSCULAR | Status: DC
  Administered 2019-02-16 – 2019-02-17 (×3): 31.2 mg via INTRAVENOUS
  Filled 2019-02-16 (×3): qty 1

## 2019-02-16 NOTE — Progress Notes (Signed)
PROGRESS NOTE    HORACIO PALLER  [id number] DOB: [birth year] DOA: 02/13/2019 PCP: Ocie Doyne., MD   Brief Narrative: Mitchell Murray is a 78 y.o. male with history of nonischemic cardiomyopathy status post AICD placement, history of stroke, history of A. fib on warfarin, history of nonobstructive CAD, hypothyroidism, diabetes mellitus. He presented secondary to abdominal pain and found to have a bowel obstruction and incidentally found to be COVID-19 positive   Assessment & Plan:   Principal Problem:   SBO (small bowel obstruction) (HCC) Active Problems:   CAD (coronary artery disease)   AICD (automatic cardioverter/defibrillator) present   A-fib [facility])   Prostate cancer ([place])   Hypothyroidism   Essential hypertension   Chronic systolic congestive heart failure ([place])   SBO Concern for SBO seen on CT scan from OSH. Complicated by [id number] infection. General surgery on board. No bowel movements but passing gas. -General surgery recommendations: Advancing diet slowly  COVID-19 infection Symptomatic with cough. No dyspnea. Patient is on oxygen but no documented hypoxia. Chest x-ray clear. -Check for hypoxia today (discussed with nurse); if hypoxia, will consider Remdesivir -Start solu-medrol -Daily CRP, CBC, CMP, D-dimer, ferritin, mag, phos  History of atrial fibrillation -Continue metoprolol, digoxin -Coumadin per pharmacy once clear on patient's course; heparin for INR <2  Diabetes mellitus, type 2 -Continue SSI  Anemia Stable. Mild. Outpatient follow-up  Hypothyroidism -Continue Synthroid  Prostate cancer S/p prostatectomy.     DVT prophylaxis: SCDs Code Status:   Code Status: DNR Family Communication: None at bedside Disposition Plan: Discharge pending clearance for general surgery and pending COVID treatment/management   Consultants:   General surgery  Procedures:   None  Antimicrobials:  None    Subjective: Passing gas. Still no  bowel movement. No chest pain, dyspnea, abdominal pain.  Objective: Vitals:   02/15/19 2129 02/15/19 2212 02/16/19 0659 02/16/19 0817  BP: (!) 102/59  126/66 123/68  Pulse: 65  75 76  Resp: 18  18 (!) 25  Temp: 97.6 F (36.4 C) 97.6 F (36.4 C) (!) 100.6 F (38.1 C) (!) 100.4 F (38 C)  TempSrc: Oral Oral Oral Oral  SpO2: 98%  99% 97%  Weight:      Height:        Intake/Output Summary (Last 24 hours) at 02/16/2019 1146 Last data filed at 02/16/2019 0700 Gross per 24 hour  Intake 2211.83 ml  Output 700 ml  Net 1511.83 ml   Filed Weights   02/13/19 2022 02/14/19 0558 02/15/19 0516  Weight: 64.9 kg 63.4 kg 62.7 kg    Examination:  General exam: Appears calm and comfortable Respiratory system: Clear to auscultation. Respiratory effort normal. Cardiovascular system: S1 & S2 heard, RRR. No murmurs, rubs, gallops or clicks. Gastrointestinal system: Abdomen is much less distended, soft and nontender. No organomegaly or masses felt. Normal bowel sounds heard. Central nervous system: Alert and oriented. No focal neurological deficits. Extremities: No edema. No calf tenderness Skin: No cyanosis. No rashes Psychiatry: Judgement and insight appear normal. Mood & affect appropriate.      Data Reviewed: I have personally reviewed following labs and imaging studies  CBC: Recent Labs  Lab 02/13/19 2354 02/14/19 0414 02/15/19 0444 02/16/19 0841  WBC 6.7 6.1 5.0 4.8  NEUTROABS 3.9  --   --  3.5  HGB 12.5* 10.8* 11.1* 10.4*  HCT 39.0 34.6* 34.9* 32.4*  MCV 94.4 94.8 93.1 92.0  PLT 188 174 199 [id number]   Basic Metabolic Panel: Recent Labs  Lab  02/13/19 2354 02/14/19 0414 02/16/19 0841  NA 139 138 138  K 4.7 4.2 4.2  CL 105 108 108  CO2 24 23 22   GLUCOSE 89 85 125*  BUN 20 20 11   CREATININE 1.08 0.92 1.06  CALCIUM 9.2 8.5* 8.3*   GFR: Estimated Creatinine Clearance: 50.9 mL/min (by C-G formula based on SCr of 1.06 mg/dL). Liver Function Tests: Recent Labs  Lab  02/13/19 2354 02/16/19 0841  AST 22 28  ALT 15 20  ALKPHOS 40 43  BILITOT 0.9 1.9*  PROT 7.3 6.4*  ALBUMIN 3.7 3.0*   No results for input(s): LIPASE, AMYLASE in the last 168 hours. No results for input(s): AMMONIA in the last 168 hours. Coagulation Profile: Recent Labs  Lab 02/13/19 2354 02/15/19 0444 02/16/19 0430  INR 2.8* 3.5* 3.0*   Cardiac Enzymes: No results for input(s): CKTOTAL, CKMB, CKMBINDEX, TROPONINI in the last 168 hours. BNP (last 3 results) No results for input(s): PROBNP in the last 8760 hours. HbA1C: Recent Labs    02/13/19 2354  HGBA1C 5.7*   CBG: Recent Labs  Lab 02/15/19 1646 02/15/19 2135 02/16/19 0025 02/16/19 0649 02/16/19 0805  GLUCAP 128* 208* 127* 133* 113*   Lipid Profile: No results for input(s): CHOL, HDL, LDLCALC, TRIG, CHOLHDL, LDLDIRECT in the last 72 hours. Thyroid Function Tests: No results for input(s): TSH, T4TOTAL, FREET4, T3FREE, THYROIDAB in the last 72 hours. Anemia Panel: Recent Labs    02/13/19 2354 02/16/19 0841  FERRITIN 223 360*   Sepsis Labs: Recent Labs  Lab 02/13/19 2354  LATICACIDVEN 1.0    No results found for this or any previous visit (from the past 240 hour(s)).       Radiology Studies: No results found.      Scheduled Meds: . digoxin  0.125 mg Oral Daily  . insulin aspart  0-9 Units Subcutaneous TID WC  . levothyroxine  50 mcg Oral Q0600  . methylPREDNISolone (SOLU-MEDROL) injection  0.5 mg/kg Intravenous Q12H  . metoprolol succinate  75 mg Oral Daily   Continuous Infusions: . dextrose 5 % and 0.9% NaCl 75 mL/hr at 02/16/19 0015     LOS: 3 days     Cordelia Poche, MD [name] 02/16/2019, 11:46 AM  If 7PM-7AM, please contact night-coverage [email or web address]

## 2019-02-16 NOTE — Progress Notes (Signed)
[place] [facility] Office:  [phone] General Surgery Progress Note   LOS: 3 days  POD -     Chief Complaint: Abdominal pain  Assessment and Plan: 1.  SBO  Tolerating clear liquid diet - though no BM for several days.  He is passing flatus.  Will advance to full liquids.  2.  Covid positive  Asymptomatic  But had low grade fever - 100.4. 3.  CAD  History of pacemaker/AICD 4.  A. Fib 5.  Anticoagulated  PT/INR - 30.7/3.0 - 02/16/2019  Coumadin on hold 5.  HTN 6.  DM 7.  History of prostate cancer and prostatectomy 8.  Tremor 9.  DVT prophylaxis - on Coumadin   Principal Problem:   SBO (small bowel obstruction) (HCC) Active Problems:   CAD (coronary artery disease)   AICD (automatic cardioverter/defibrillator) present   A-fib [facility])   Prostate cancer ([place])   Hypothyroidism   Essential hypertension   Chronic systolic congestive heart failure (HCC)   Subjective:  No abdominal complaint.  No nausea.  He is passing flatus.  Objective:   Vitals:   02/16/19 0659 02/16/19 0817  BP: 126/66 123/68  Pulse: 75 76  Resp: 18 (!) 25  Temp: (!) 100.6 F (38.1 C) (!) 100.4 F (38 C)  SpO2: 99% 97%     Intake/Output from previous day:  10/17 0701 - 10/18 0700 In: 2783.1 [P.O.:1320; I.V.:1463.1] Out: 700 [Urine:700]  Intake/Output this shift:  No intake/output data recorded.   Physical Exam:   General: Older WM  who is alert and oriented.  He has a noticeable tremor   HEENT: Normal. Pupils equal. .   Lungs: Clear   Abdomen: Mild distention with few BS.  No localized tenderness.   Lab Results:    Recent Labs    02/14/19 0414 02/15/19 0444  WBC 6.1 5.0  HGB 10.8* 11.1*  HCT 34.6* 34.9*  PLT 174 199    BMET   Recent Labs    02/13/19 2354 02/14/19 0414  NA 139 138  K 4.7 4.2  CL 105 108  CO2 24 23  GLUCOSE 89 85  BUN 20 20  CREATININE 1.08 0.92  CALCIUM 9.2 8.5*    PT/INR   Recent Labs    02/15/19 0444 02/16/19 0430  LABPROT 34.9*  30.7*  INR 3.5* 3.0*    ABG  No results for input(s): PHART, HCO3 in the last 72 hours.  Invalid input(s): PCO2, PO2   Studies/Results:  No results found.   Anti-infectives:   Anti-infectives (From admission, onward)   None      Alphonsa Overall, MD, [facility] Surgery Office: [phone] 02/16/2019

## 2019-02-17 LAB — COMPREHENSIVE METABOLIC PANEL
ALT: 36 U/L (ref 0–44)
AST: 40 U/L (ref 15–41)
Albumin: 3.1 g/dL — ABNORMAL LOW (ref 3.5–5.0)
Alkaline Phosphatase: 45 U/L (ref 38–126)
Anion gap: 5 (ref 5–15)
BUN: 17 mg/dL (ref 8–23)
CO2: 23 mmol/L (ref 22–32)
Calcium: 8.6 mg/dL — ABNORMAL LOW (ref 8.9–10.3)
Chloride: 109 mmol/L (ref 98–111)
Creatinine, Ser: 0.84 mg/dL (ref 0.61–1.24)
GFR calc Af Amer: >60 mL/min (ref >60)
GFR calc non Af Amer: >60 mL/min (ref >60)
Glucose, Bld: 215 mg/dL — ABNORMAL HIGH (ref 70–99)
Potassium: 4.3 mmol/L (ref 3.5–5.1)
Sodium: 137 mmol/L (ref 135–145)
Total Bilirubin: 1.2 mg/dL (ref 0.3–1.2)
Total Protein: 6.7 g/dL (ref 6.5–8.1)

## 2019-02-17 LAB — CBC WITH DIFFERENTIAL/PLATELET
Abs Immature Granulocytes: 0.01 10*3/uL (ref 0.00–0.07)
Basophils Absolute: 0 10*3/uL (ref 0.0–0.1)
Basophils Relative: 0 %
Eosinophils Absolute: 0 10*3/uL (ref 0.0–0.5)
Eosinophils Relative: 0 %
HCT: 33.4 % — ABNORMAL LOW (ref 39.0–52.0)
Hemoglobin: 10.7 g/dL — ABNORMAL LOW (ref 13.0–17.0)
Immature Granulocytes: 0 %
Lymphocytes Relative: 22 %
Lymphs Abs: 0.6 10*3/uL — ABNORMAL LOW (ref 0.7–4.0)
MCH: 29.4 pg (ref 26.0–34.0)
MCHC: 32 g/dL (ref 30.0–36.0)
MCV: 91.8 fL (ref 80.0–100.0)
Monocytes Absolute: 0.2 10*3/uL (ref 0.1–1.0)
Monocytes Relative: 9 %
Neutro Abs: 1.8 10*3/uL (ref 1.7–7.7)
Neutrophils Relative %: 69 %
Platelets: 227 10*3/uL (ref 150–400)
RBC: 3.64 MIL/uL — ABNORMAL LOW (ref 4.22–5.81)
RDW: 12.6 % (ref 11.5–15.5)
WBC: 2.7 10*3/uL — ABNORMAL LOW (ref 4.0–10.5)
nRBC: 0 % (ref 0.0–0.2)

## 2019-02-17 LAB — MAGNESIUM: Magnesium: 2 mg/dL (ref 1.7–2.4)

## 2019-02-17 LAB — GLUCOSE, CAPILLARY
Glucose-Capillary: 129 mg/dL — ABNORMAL HIGH (ref 70–99)
Glucose-Capillary: 161 mg/dL — ABNORMAL HIGH (ref 70–99)
Glucose-Capillary: 212 mg/dL — ABNORMAL HIGH (ref 70–99)
Glucose-Capillary: 247 mg/dL — ABNORMAL HIGH (ref 70–99)

## 2019-02-17 LAB — C-REACTIVE PROTEIN: CRP: 13.3 mg/dL — ABNORMAL HIGH (ref <1.0)

## 2019-02-17 LAB — PROTIME-INR
INR: 2.8 — ABNORMAL HIGH (ref 0.8–1.2)
Prothrombin Time: 29.3 seconds — ABNORMAL HIGH (ref 11.4–15.2)

## 2019-02-17 LAB — FERRITIN: Ferritin: 319 ng/mL (ref 24–336)

## 2019-02-17 LAB — D-DIMER, QUANTITATIVE: D-Dimer, Quant: 0.44 ug/mL-FEU (ref 0.00–0.50)

## 2019-02-17 LAB — PHOSPHORUS: Phosphorus: 1.3 mg/dL — ABNORMAL LOW (ref 2.5–4.6)

## 2019-02-17 MED ORDER — NON FORMULARY: 5.0000 mg | Freq: Once | Status: DC

## 2019-02-17 MED ORDER — MELATONIN 5 MG PO TABS
5.0000 mg | ORAL_TABLET | Freq: Once | ORAL | Status: AC
  Administered 2019-02-17: 5 mg via ORAL
  Filled 2019-02-17: qty 1

## 2019-02-17 MED ORDER — POTASSIUM PHOSPHATE MONOBASIC 500 MG PO TABS: 500.0000 mg | ORAL_TABLET | Freq: Three times a day (TID) | ORAL | 0 refills | Status: DC

## 2019-02-17 MED ORDER — POTASSIUM PHOSPHATE MONOBASIC 500 MG PO TABS
500.0000 mg | ORAL_TABLET | Freq: Three times a day (TID) | ORAL | Status: DC
  Filled 2019-02-17: qty 1

## 2019-02-17 MED ORDER — DEXAMETHASONE 6 MG PO TABS: 6.0000 mg | ORAL_TABLET | Freq: Every day | ORAL | 0 refills | Status: AC

## 2019-02-17 MED FILL — K-PHOS ORIGINAL TABLET: 500 | 2 days supply | Qty: 2 | Fill #0

## 2019-02-17 NOTE — Progress Notes (Signed)
CCMD notified writer that pt had 12 bt run of wide QRS V Tach. Upon assessment, pt not in distress. MD notified and will view strip. Will continue to monitor.

## 2019-02-17 NOTE — Discharge Summary (Signed)
Physician Discharge Summary  CURRY CONK [id number] DOB: [birth year] DOA: 02/13/2019  PCP: Ocie Doyne., MD  Admit date: 02/13/2019 Discharge date: 02/17/2019  Admitted From: Home Disposition: Home  Recommendations for Outpatient Follow-up:  1. Follow up with PCP in 1 week 2. Please obtain BMP/CBC in one week 3. Please follow up on the following pending results: None  Home Health: None Equipment/Devices: None  Discharge Condition: Stable CODE STATUS: DNR Diet recommendation: Heart healthy   Brief/Interim Summary:  Admission HPI written by Gean Birchwood, MD   Chief Complaint: Abdominal pain.  HPI: Mitchell Murray is a 78 y.o. male with history of nonischemic cardiomyopathy status post AICD placement, history of stroke, history of A. fib on warfarin, history of nonobstructive CAD, hypothyroidism, diabetes mellitus, history of prostate cancer had presented to the ER at [facility] with complaints of abdominal pain.  Abdominal pain is mostly epigastric area for the last 2 days.  Denies any nausea vomiting or diarrhea last bowel movement was more than 48 hours ago.  Patient did not have any fever chills or any difficulty breathing or shortness of breath.  In the ER at [facility] patient had a CT scan of the abdomen and pelvis which shows features for proximal small bowel obstruction with a transition point around umbilicus concerning for mechanical obstruction.  In addition patient also had some groundglass opacities in the lung field when CAT scan was done for the abdomen.  COVID-19 test was positive but patient was asymptomatic.  Patient abdominal pain improved with pain relief medications and since patient has mechanical obstruction with COVID-19 patient was planned to be transferred to [facility] long hospital after discussing with Dr. Marlou Starks general surgeon at [facility] long.  Labs done over at [facility] showed INR of 2.9 platelets 231 hemoglobin 13.1 WBC 7.3  sodium 137 bicarb 25 creatinine 1.1 CRP 52 LDH 531. On my exam patient abdomen is mildly distended but patient denies any pain at this time.  Acute abdominal series done over ER shows improvement in his dilatation.    Hospital course:  SBO Concern for SBO seen on CT scan from OSH. Complicated by [id number] infection. General surgery on board. Patient progressed well. No abdominal pain. Began passing gas, followed by two bowel movements. General surgery signed off prior to discharge. No follow-up needed.  COVID-19 infection Symptomatic with cough. No dyspnea. Patient was placed on oxygen but no documented hypoxia. No hypoxia on room air. Chest x-ray clear. Started steroid treatment with continuation on discharge; total duration of 10 days. Isolation instructions given.  History of atrial fibrillation Continue metoprolol, digoxin. Resume home Coumadin  Diabetes mellitus, type 2 Insulin while inpatient. Continue home metformin on discharge.  Anemia Stable. Mild. Outpatient follow-up  Hypothyroidism Continue Synthroid  Prostate cancer S/p prostatectomy.   Abnormal telemetry Chronic bundle branch block with episode of tachycardia. Asymptomatic. Follow-up with cardiology outpatient as needed.   Discharge Diagnoses:  Principal Problem:   SBO (small bowel obstruction) (HCC) Active Problems:   CAD (coronary artery disease)   AICD (automatic cardioverter/defibrillator) present   A-fib (HCC)   Prostate cancer ([place])   Hypothyroidism   Essential hypertension   Chronic systolic congestive heart failure ([place])    Discharge Instructions  Discharge Instructions    Diet - low sodium heart healthy   Complete by: As directed    Increase activity slowly   Complete by: As directed    [name]   Complete by: As directed  Is the patient willing to use the [place] for home monitoring?: Yes   Temperature monitoring   Complete by: Feb 17, 2019    After how many days would you like to receive a notification of this patient's flowsheet entries?: 1     Allergies as of 02/17/2019      Reactions   Ivp Dye [iodinated Diagnostic Agents] Anaphylaxis   Metrizamide Anaphylaxis      Medication List    STOP taking these medications   oxyCODONE-acetaminophen 5-325 MG tablet Commonly known as: Percocet     TAKE these medications   atorvastatin 20 MG tablet Commonly known as: LIPITOR Take 20 mg by mouth every evening.   dexamethasone 6 MG tablet Commonly known as: DECADRON Take 1 tablet (6 mg total) by mouth daily for 8 days. Start taking on: February 18, 2019   digoxin 0.125 MG tablet Commonly known as: LANOXIN Take 0.125 mg by mouth daily.   levothyroxine 50 MCG tablet Commonly known as: SYNTHROID Take 50 mcg by mouth daily before breakfast.   meclizine 25 MG tablet Commonly known as: ANTIVERT Take 12.5 mg by mouth 3 (three) times daily as needed for dizziness.   metFORMIN 1000 MG tablet Commonly known as: GLUCOPHAGE Take 1,000 mg by mouth 2 (two) times daily.   metoprolol succinate 50 MG 24 hr tablet Commonly known as: TOPROL-XL Take 75 mg by mouth daily. Take with or immediately following a meal.   Omega-3 1000 MG Caps Take 1,000 mg by mouth daily.   omeprazole 40 MG capsule Commonly known as: PRILOSEC Take 40 mg by mouth daily as needed (Heart Burn).   potassium phosphate (monobasic) 500 MG tablet Commonly known as: K-PHOS ORIGINAL Take 1 tablet (500 mg total) by mouth 3 (three) times daily with meals for 2 doses. Start taking on: February 18, 2019   primidone 50 MG tablet Commonly known as: MYSOLINE Take 50 mg by mouth at bedtime.   sertraline 25 MG tablet Commonly known as: ZOLOFT Take 25 mg by mouth daily as needed. Depression   spironolactone 25 MG tablet Commonly known as: ALDACTONE Take 25 mg by mouth daily.   tamsulosin 0.4 MG Caps capsule Commonly known as: Flomax Take 1 capsule  (0.4 mg total) by mouth daily.   warfarin 4 MG tablet Commonly known as: COUMADIN Take 2-4 mg by mouth See admin instructions. Tuesday and Thursday 2mg ; Monday Wed Friday Sat Sun 4mg       Follow-up Information    Ocie Doyne., MD. Schedule an appointment as soon as possible for a visit in 1 week(s).   Specialty: Family Medicine Contact information: [address] [place] [address] [phone]          Allergies  Allergen Reactions  . Ivp Dye [Iodinated Diagnostic Agents] Anaphylaxis  . Metrizamide Anaphylaxis    Consultations:  General surgery   Procedures/Studies: Dg Abd Acute 2+v W 1v Chest  Result Date: 02/14/2019 CLINICAL DATA:  Bowel obstruction EXAM: DG ABDOMEN ACUTE W/ 1V CHEST COMPARISON:  12/15/2013, 01/21/2018, CT 02/13/2019 FINDINGS: Single-view chest demonstrates left-sided pacing device. Mild cardiomegaly. Trace left pleural effusion or thickening. No consolidation. Aortic atherosclerosis. Supine and decubitus views of the abdomen demonstrate no free air. Decreased small bowel distension within the central abdomen with few residual air filled distended central small bowel loops up to 3.5 cm. Increased distal bowel gas. IMPRESSION: 1. No radiographic evidence for acute cardiopulmonary abnormality 2. Decreased amount of small bowel distension when compared to  scout image of CT. Electronically Signed   By: Donavan Foil M.D.   On: 02/14/2019 00:04      Subjective: No abdominal pain, dyspnea, chest pain.  Discharge Exam: Vitals:   02/17/19 0452 02/17/19 0900  BP: 134/73 (!) 150/89  Pulse: 65 75  Resp: 19 12  Temp: 97.6 F (36.4 C) 97.8 F (36.6 C)  SpO2: 98% 98%   Vitals:   02/16/19 1200 02/16/19 2039 02/17/19 0452 02/17/19 0900  BP: 109/67 127/68 134/73 (!) 150/89  Pulse: 77 75 65 75  Resp: 17 (!) 23 19 12   Temp: 98.9 F (37.2 C) (!) 97.4 F (36.3 C) 97.6 F (36.4 C) 97.8 F (36.6 C)  TempSrc: Oral Oral Oral Oral  SpO2: 100%  98% 98%   Weight:   65.4 kg   Height:        General: Pt is alert, awake, not in acute distress Cardiovascular: RRR, S1/S2 +, no rubs, no gallops Respiratory: CTA bilaterally, no wheezing, no rhonchi Abdominal: Soft, NT, slightly distended, bowel sounds + Extremities: no edema, no cyanosis    The results of significant diagnostics from this hospitalization (including imaging, microbiology, ancillary and laboratory) are listed below for reference.     Microbiology: No results found for this or any previous visit (from the past 240 hour(s)).   Labs: BNP (last 3 results) No results for input(s): BNP in the last 8760 hours. Basic Metabolic Panel: Recent Labs  Lab 02/13/19 2354 02/14/19 0414 02/16/19 0841 02/17/19 0347  NA 139 138 138 137  K 4.7 4.2 4.2 4.3  CL 105 108 108 109  CO2 24 23 22 23   GLUCOSE 89 85 125* 215*  BUN 20 20 11 17   CREATININE 1.08 0.92 1.06 0.84  CALCIUM 9.2 8.5* 8.3* 8.6*  MG  --   --   --  2.0  PHOS  --   --   --  1.3*   Liver Function Tests: Recent Labs  Lab 02/13/19 2354 02/16/19 0841 02/17/19 0347  AST 22 28 40  ALT 15 20 36  ALKPHOS 40 43 45  BILITOT 0.9 1.9* 1.2  PROT 7.3 6.4* 6.7  ALBUMIN 3.7 3.0* 3.1*   No results for input(s): LIPASE, AMYLASE in the last 168 hours. No results for input(s): AMMONIA in the last 168 hours. CBC: Recent Labs  Lab 02/13/19 2354 02/14/19 0414 02/15/19 0444 02/16/19 0841 02/17/19 0347  WBC 6.7 6.1 5.0 4.8 2.7*  NEUTROABS 3.9  --   --  3.5 1.8  HGB 12.5* 10.8* 11.1* 10.4* 10.7*  HCT 39.0 34.6* 34.9* 32.4* 33.4*  MCV 94.4 94.8 93.1 92.0 91.8  PLT 188 174 199 201 227   Cardiac Enzymes: No results for input(s): CKTOTAL, CKMB, CKMBINDEX, TROPONINI in the last 168 hours. BNP: Invalid input(s): POCBNP CBG: Recent Labs  Lab 02/16/19 2035 02/17/19 0028 02/17/19 0447 02/17/19 0912 02/17/19 1155  GLUCAP 267* 247* 212* 161* 129*   D-Dimer Recent Labs    02/16/19 0841 02/17/19 0347  DDIMER 0.51*  0.44   Hgb A1c No results for input(s): HGBA1C in the last 72 hours. Lipid Profile No results for input(s): CHOL, HDL, LDLCALC, TRIG, CHOLHDL, LDLDIRECT in the last 72 hours. Thyroid function studies No results for input(s): TSH, T4TOTAL, T3FREE, THYROIDAB in the last 72 hours.  Invalid input(s): FREET3 Anemia work up Recent Labs    02/16/19 0841 02/17/19 0347  FERRITIN 360* 319   Urinalysis    Component Value Date/Time   COLORURINE BROWN (A)  04/08/2018 0112   APPEARANCEUR CLOUDY (A) 04/08/2018 0112   LABSPEC 1.019 04/08/2018 0112   PHURINE 5.0 04/08/2018 0112   GLUCOSEU NEGATIVE 04/08/2018 0112   HGBUR LARGE (A) 04/08/2018 0112   BILIRUBINUR NEGATIVE 04/08/2018 0112   KETONESUR NEGATIVE 04/08/2018 0112   PROTEINUR 100 (A) 04/08/2018 0112   UROBILINOGEN 0.2 12/02/2012 1151   NITRITE NEGATIVE 04/08/2018 0112   LEUKOCYTESUR NEGATIVE 04/08/2018 0112   Sepsis Labs Invalid input(s): PROCALCITONIN,  WBC,  LACTICIDVEN Microbiology No results found for this or any previous visit (from the past 240 hour(s)).   Time coordinating discharge: 35 minutes  SIGNED:   Cordelia Poche, MD [name] 02/17/2019, 1:02 PM

## 2019-02-17 NOTE — Care Management Important Message (Signed)
Important Message  Patient Details IM Letter given to Cookie McGibboney RN to present to the Patient Name: Mitchell Murray MRN: [id number] Date of Birth: 1941-04-14   Medicare Important Message Given:  Yes     Kerin Salen 02/17/2019, 11:18 AM

## 2019-02-17 NOTE — TOC Progression Note (Signed)
Transition of Care [facility]) - Progression Note    Patient Details  Name: Mitchell Murray MRN: [id number] Date of Birth: 11-06-1940  [facility] of [facility]) CM/SW Contact  Purcell Mouton, RN Phone Number: 02/17/2019, 3:24 PM  Clinical Narrative:    Pt stable for discharge and plan to discharge home with no needs.    Expected Discharge Plan: Home/Self Care    Expected Discharge Plan and Services Expected Discharge Plan: Home/Self Care       Living arrangements for the past 2 months: Single Family Home Expected Discharge Date: 02/17/19                                     Social Determinants of Health (SDOH) Interventions    Readmission Risk Interventions No flowsheet data found.

## 2019-02-17 NOTE — Progress Notes (Signed)
PT Cancellation Note  Patient Details Name: Mitchell Murray MRN: [id number] DOB: 28-Feb-1941   Cancelled Treatment:    Reason Eval/Treat Not Completed: PT screened, no needs identified, will sign off. Per nursing, pt is up in room and walking.  He has been getting up to the chair frequently. Spoke to pt on the phone and he has a level entry into his 1-level home and ambulates without AD. He will have his step-sons and sister to help as needed. He ambulates without AD.  He feels comfortable with his current level of mobility.  He is aware that if he gets home and feels like he needs more A to call his MD regarding HHPT. Spoke to MD regarding pt's mobility per nursing prior to phone call.  Will d/c at this time.   Galen Manila 02/17/2019, 9:41 AM

## 2019-02-17 NOTE — Discharge Instructions (Signed)
Mitchell Murray,  You were in the hospital because of a bowel obstruction. This resolved on its own, thankfully with time and bowel rest. Please follow-up with your primary care physician. You also were diagnosed with COVID-19. Please follow-up recommendations added to your discharge. You will need to quarantine for at least 10 days after being diagnosed and last symptoms experienced; in addition to 24 days after last fever. If symptoms worsen, especially worsening breathing issues, please return to the emergency department.

## 2019-02-17 NOTE — Progress Notes (Signed)
[facility] Progress Note     Subjective: CC-  No complaints this morning. Denies abdominal pain, nausea, or vomiting. Tolerating full liquids. Passing flatus. BM last night and one this morning.  Objective: Vital signs in last 24 hours: Temp:  [97.4 F (36.3 C)-98.9 F (37.2 C)] 97.6 F (36.4 C) (10/19 0452) Pulse Rate:  [65-77] 65 (10/19 0452) Resp:  [17-23] 19 (10/19 0452) BP: (109-134)/(67-73) 134/73 (10/19 0452) SpO2:  [98 %-100 %] 98 % (10/19 0452) Weight:  [65.4 kg] 65.4 kg (10/19 0452) Last BM Date: 02/16/19  Intake/Output from previous day: 10/18 0701 - 10/19 0700 In: 975.5 [P.O.:600; I.V.:375.5] Out: 1152 [Urine:1151; Stool:1] Intake/Output this shift: No intake/output data recorded.  PE: Gen:  Alert, NAD, pleasant HEENT: EOM's intact, pupils equal and round Pulm:  Rate and effort normal Abd: Soft, NT/ND, no HSM Ext:  Calves soft and nontender without edema Skin: no rashes noted, warm and dry  Lab Results:  Recent Labs    02/16/19 0841 02/17/19 0347  WBC 4.8 2.7*  HGB 10.4* 10.7*  HCT 32.4* 33.4*  PLT 201 227   BMET Recent Labs    02/16/19 0841 02/17/19 0347  NA 138 137  K 4.2 4.3  CL 108 109  CO2 22 23  GLUCOSE 125* 215*  BUN 11 17  CREATININE 1.06 0.84  CALCIUM 8.3* 8.6*   PT/INR Recent Labs    02/16/19 0430 02/17/19 0347  LABPROT 30.7* 29.3*  INR 3.0* 2.8*   CMP     Component Value Date/Time   NA 137 02/17/2019 0347   K 4.3 02/17/2019 0347   CL 109 02/17/2019 0347   CO2 23 02/17/2019 0347   GLUCOSE 215 (H) 02/17/2019 0347   BUN 17 02/17/2019 0347   CREATININE 0.84 02/17/2019 0347   CALCIUM 8.6 (L) 02/17/2019 0347   PROT 6.7 02/17/2019 0347   ALBUMIN 3.1 (L) 02/17/2019 0347   AST 40 02/17/2019 0347   ALT 36 02/17/2019 0347   ALKPHOS 45 02/17/2019 0347   BILITOT 1.2 02/17/2019 0347   GFRNONAA >60 02/17/2019 0347   GFRAA >60 02/17/2019 0347   Lipase     Component Value Date/Time   LIPASE 45 01/21/2018  1634       Studies/Results: No results found.  Anti-infectives: Anti-infectives (From admission, onward)   None       Assessment/Plan 1.  SBO            Tolerating fulls and having bowel function. Obstruction resolved. Advance to soft diet. Patient stable for discharge from surgical standpoint. Ok to restart coumadin. General surgery will sign off, please call with concerns. He does not need to follow up with surgeon.   2.  COVID positive - asymptomatic 3.  CAD             History of pacemaker/AICD 4.  A. Fib 5.  Anticoagulated             PT/INR - 29.3/2.8             Coumadin has been on hold 5.  HTN 6.  DM 7.  History of prostate cancer and prostatectomy 8.  Tremor  ID - none FEN - soft diet VTE - SCDs Foley - none Follow up - no surgical follow up   LOS: 4 days    Wellington [place], [facility] 02/17/2019, 8:44 AM Please see Amion for pager number during day hours 7:00am-4:30pm

## 2019-02-17 NOTE — Progress Notes (Signed)
ANTICOAGULATION CONSULT NOTE - Initial Consult  Pharmacy Consult for heparin Indication:   Allergies  Allergen Reactions  . Ivp Dye [Iodinated Diagnostic Agents] Anaphylaxis  . Metrizamide Anaphylaxis    Patient Measurements: Height: 5\' 10"  (177.8 cm) Weight: 144 lb 1.6 oz (65.4 kg) IBW/kg (Calculated) : 73 Heparin Dosing Weight: 65.4 kg  Vital Signs: Temp: 97.6 F (36.4 C) (10/19 0452) Temp Source: Oral (10/19 0452) BP: 134/73 (10/19 0452) Pulse Rate: 65 (10/19 0452)  Labs: Recent Labs    02/15/19 0444 02/16/19 0430 02/16/19 0841 02/17/19 0347  HGB 11.1*  --  10.4* 10.7*  HCT 34.9*  --  32.4* 33.4*  PLT 199  --  201 227  LABPROT 34.9* 30.7*  --  29.3*  INR 3.5* 3.0*  --  2.8*  CREATININE  --   --  1.06 0.84    Estimated Creatinine Clearance: 67 mL/min (by C-G formula based on SCr of 0.84 mg/dL).   Medical History: Past Medical History:  Diagnosis Date  . Automatic implantable cardioverter-defibrillator in situ   . Cardiomegaly   . CHF (congestive heart failure) ([place])   . Coronary artery disease   . Dysrhythmia    atrial fib  . GERD (gastroesophageal reflux disease)   . Hard of hearing   . Hyperlipidemia   . Hypertension   . Hypothyroidism   . Kidney stones   . MI (myocardial infarction) ([place])   . Pacemaker   . Prostate cancer ([place])     Medications:  Medications Prior to Admission  Medication Sig Dispense Refill Last Dose  . atorvastatin (LIPITOR) 20 MG tablet Take 20 mg by mouth every evening.    02/12/2019 at Unknown time  . digoxin (LANOXIN) 0.125 MG tablet Take 0.125 mg by mouth daily.   02/12/2019 at Unknown time  . levothyroxine (SYNTHROID, LEVOTHROID) 50 MCG tablet Take 50 mcg by mouth daily before breakfast.   02/12/2019 at Unknown time  . meclizine (ANTIVERT) 25 MG tablet Take 12.5 mg by mouth 3 (three) times daily as needed for dizziness.    Past Month at Unknown time  . metFORMIN (GLUCOPHAGE) 1000 MG tablet Take 1,000 mg by mouth 2 (two)  times daily.   02/12/2019 at Unknown time  . metoprolol succinate (TOPROL-XL) 50 MG 24 hr tablet Take 75 mg by mouth daily. Take with or immediately following a meal.    02/12/2019 at 7pm  . Omega-3 1000 MG CAPS Take 1,000 mg by mouth daily.    Past Week at Unknown time  . omeprazole (PRILOSEC) 40 MG capsule Take 40 mg by mouth daily as needed (Heart Burn).    Past Week at Unknown time  . primidone (MYSOLINE) 50 MG tablet Take 50 mg by mouth at bedtime.   02/12/2019  . sertraline (ZOLOFT) 25 MG tablet Take 25 mg by mouth daily as needed. Depression   Past Week at Unknown time  . spironolactone (ALDACTONE) 25 MG tablet Take 25 mg by mouth daily.   02/12/2019 at Unknown time  . warfarin (COUMADIN) 4 MG tablet Take 2-4 mg by mouth See admin instructions. Tuesday and Thursday 2mg ; Monday Wed Friday Sat Sun 4mg    02/12/2019 at Unknown time  . oxyCODONE-acetaminophen (PERCOCET) 5-325 MG tablet Take 1 tablet by mouth every 4 (four) hours as needed for severe pain. (Patient not taking: Reported on 02/13/2019) 10 tablet 0 Not Taking at Unknown time  . tamsulosin (FLOMAX) 0.4 MG CAPS capsule Take 1 capsule (0.4 mg total) by mouth daily. (Patient not  taking: Reported on 01/21/2018) 30 capsule 0 Not Taking at Unknown time    Assessment: 78 yo on warfarin PTA for Afib.  Admitted with SBO.  Pharmacy consulted to dose heparin when INR < 2 02/17/2019  INR 1.8, CBC stable, no bleeding reported. Tolerating FLD. 2 BMs charted on 10/18  Goal of Therapy:  Heparin level 0.3-0.7 units/ml Monitor platelets by anticoagulation protocol: Yes   Plan:  No heparin today as INR remains > 2. Consider resuming warfarin if OK with CCS  Eudelia Bunch, Pharm.D [phone] 02/17/2019 8:18 AM

## 2019-02-18 DIAGNOSIS — I110 Hypertensive heart disease with heart failure: Secondary | ICD-10-CM | POA: Diagnosis not present

## 2019-02-18 DIAGNOSIS — R079 Chest pain, unspecified: Secondary | ICD-10-CM | POA: Diagnosis not present

## 2019-02-18 DIAGNOSIS — U071 COVID-19: Secondary | ICD-10-CM | POA: Diagnosis not present

## 2019-02-18 DIAGNOSIS — R531 Weakness: Secondary | ICD-10-CM | POA: Diagnosis not present

## 2019-02-18 DIAGNOSIS — I509 Heart failure, unspecified: Secondary | ICD-10-CM | POA: Diagnosis not present

## 2019-02-19 ENCOUNTER — Encounter (HOSPITAL_COMMUNITY): Payer: Self-pay

## 2019-02-19 ENCOUNTER — Other Ambulatory Visit: Payer: Self-pay

## 2019-02-19 ENCOUNTER — Observation Stay (HOSPITAL_COMMUNITY)
Admission: AD | Admit: 2019-02-19 | Discharge: 2019-02-20 | Disposition: A | Discharge status: Home / Self Care | Payer: Medicare Other | Source: Other Acute Inpatient Hospital | Attending: Internal Medicine | Admitting: Internal Medicine

## 2019-02-19 ENCOUNTER — Observation Stay (HOSPITAL_COMMUNITY): Payer: Medicare Other

## 2019-02-19 DIAGNOSIS — Z8546 Personal history of malignant neoplasm of prostate: Secondary | ICD-10-CM | POA: Diagnosis not present

## 2019-02-19 DIAGNOSIS — Z95810 Presence of automatic (implantable) cardiac defibrillator: Secondary | ICD-10-CM | POA: Diagnosis not present

## 2019-02-19 DIAGNOSIS — R531 Weakness: Principal | ICD-10-CM

## 2019-02-19 DIAGNOSIS — I4891 Unspecified atrial fibrillation: Secondary | ICD-10-CM | POA: Diagnosis present

## 2019-02-19 DIAGNOSIS — Z7901 Long term (current) use of anticoagulants: Secondary | ICD-10-CM | POA: Insufficient documentation

## 2019-02-19 DIAGNOSIS — Z7984 Long term (current) use of oral hypoglycemic drugs: Secondary | ICD-10-CM | POA: Insufficient documentation

## 2019-02-19 DIAGNOSIS — I5022 Chronic systolic (congestive) heart failure: Secondary | ICD-10-CM | POA: Diagnosis present

## 2019-02-19 DIAGNOSIS — E039 Hypothyroidism, unspecified: Secondary | ICD-10-CM | POA: Diagnosis not present

## 2019-02-19 DIAGNOSIS — U071 COVID-19: Secondary | ICD-10-CM | POA: Diagnosis present

## 2019-02-19 DIAGNOSIS — I110 Hypertensive heart disease with heart failure: Secondary | ICD-10-CM | POA: Insufficient documentation

## 2019-02-19 DIAGNOSIS — R262 Difficulty in walking, not elsewhere classified: Secondary | ICD-10-CM | POA: Diagnosis not present

## 2019-02-19 DIAGNOSIS — I2510 Atherosclerotic heart disease of native coronary artery without angina pectoris: Secondary | ICD-10-CM | POA: Diagnosis not present

## 2019-02-19 DIAGNOSIS — K219 Gastro-esophageal reflux disease without esophagitis: Secondary | ICD-10-CM | POA: Diagnosis present

## 2019-02-19 DIAGNOSIS — Z79890 Hormone replacement therapy (postmenopausal): Secondary | ICD-10-CM | POA: Insufficient documentation

## 2019-02-19 DIAGNOSIS — Z8249 Family history of ischemic heart disease and other diseases of the circulatory system: Secondary | ICD-10-CM | POA: Diagnosis not present

## 2019-02-19 DIAGNOSIS — I252 Old myocardial infarction: Secondary | ICD-10-CM | POA: Insufficient documentation

## 2019-02-19 DIAGNOSIS — Z79899 Other long term (current) drug therapy: Secondary | ICD-10-CM | POA: Diagnosis not present

## 2019-02-19 DIAGNOSIS — I10 Essential (primary) hypertension: Secondary | ICD-10-CM | POA: Diagnosis present

## 2019-02-19 DIAGNOSIS — I517 Cardiomegaly: Secondary | ICD-10-CM | POA: Diagnosis not present

## 2019-02-19 DIAGNOSIS — Z66 Do not resuscitate: Secondary | ICD-10-CM | POA: Diagnosis not present

## 2019-02-19 DIAGNOSIS — E785 Hyperlipidemia, unspecified: Secondary | ICD-10-CM | POA: Diagnosis present

## 2019-02-19 DIAGNOSIS — E119 Type 2 diabetes mellitus without complications: Secondary | ICD-10-CM

## 2019-02-19 LAB — D-DIMER, QUANTITATIVE: D-Dimer, Quant: 0.53 ug/mL-FEU — ABNORMAL HIGH (ref 0.00–0.50)

## 2019-02-19 LAB — CBC WITH DIFFERENTIAL/PLATELET
Abs Immature Granulocytes: 0.03 10*3/uL (ref 0.00–0.07)
Basophils Absolute: 0 10*3/uL (ref 0.0–0.1)
Basophils Relative: 0 %
Eosinophils Absolute: 0 10*3/uL (ref 0.0–0.5)
Eosinophils Relative: 0 %
HCT: 33.5 % — ABNORMAL LOW (ref 39.0–52.0)
Hemoglobin: 11.3 g/dL — ABNORMAL LOW (ref 13.0–17.0)
Immature Granulocytes: 1 %
Lymphocytes Relative: 14 %
Lymphs Abs: 0.7 10*3/uL (ref 0.7–4.0)
MCH: 30.1 pg (ref 26.0–34.0)
MCHC: 33.7 g/dL (ref 30.0–36.0)
MCV: 89.1 fL (ref 80.0–100.0)
Monocytes Absolute: 0.2 10*3/uL (ref 0.1–1.0)
Monocytes Relative: 5 %
Neutro Abs: 4.1 10*3/uL (ref 1.7–7.7)
Neutrophils Relative %: 80 %
Platelets: 277 10*3/uL (ref 150–400)
RBC: 3.76 MIL/uL — ABNORMAL LOW (ref 4.22–5.81)
RDW: 12.8 % (ref 11.5–15.5)
WBC: 5.1 10*3/uL (ref 4.0–10.5)
nRBC: 0 % (ref 0.0–0.2)

## 2019-02-19 LAB — COMPREHENSIVE METABOLIC PANEL
ALT: 55 U/L — ABNORMAL HIGH (ref 0–44)
AST: 34 U/L (ref 15–41)
Albumin: 3.2 g/dL — ABNORMAL LOW (ref 3.5–5.0)
Alkaline Phosphatase: 49 U/L (ref 38–126)
Anion gap: 12 (ref 5–15)
BUN: 32 mg/dL — ABNORMAL HIGH (ref 8–23)
CO2: 24 mmol/L (ref 22–32)
Calcium: 8.8 mg/dL — ABNORMAL LOW (ref 8.9–10.3)
Chloride: 102 mmol/L (ref 98–111)
Creatinine, Ser: 1.16 mg/dL (ref 0.61–1.24)
GFR calc Af Amer: >60 mL/min (ref >60)
GFR calc non Af Amer: 60 mL/min — ABNORMAL LOW (ref >60)
Glucose, Bld: 169 mg/dL — ABNORMAL HIGH (ref 70–99)
Potassium: 3.9 mmol/L (ref 3.5–5.1)
Sodium: 138 mmol/L (ref 135–145)
Total Bilirubin: 1.6 mg/dL — ABNORMAL HIGH (ref 0.3–1.2)
Total Protein: 6.8 g/dL (ref 6.5–8.1)

## 2019-02-19 LAB — DIGOXIN LEVEL: Digoxin Level: 0.3 ng/mL — ABNORMAL LOW (ref 0.8–2.0)

## 2019-02-19 LAB — ABO/RH: ABO/RH(D): A POS

## 2019-02-19 LAB — BRAIN NATRIURETIC PEPTIDE: B Natriuretic Peptide: 637.8 pg/mL — ABNORMAL HIGH (ref 0.0–100.0)

## 2019-02-19 LAB — GLUCOSE, CAPILLARY
Glucose-Capillary: 151 mg/dL — ABNORMAL HIGH (ref 70–99)
Glucose-Capillary: 259 mg/dL — ABNORMAL HIGH (ref 70–99)
Glucose-Capillary: 276 mg/dL — ABNORMAL HIGH (ref 70–99)
Glucose-Capillary: 163 mg/dL — ABNORMAL HIGH (ref 70–99)

## 2019-02-19 LAB — FIBRINOGEN: Fibrinogen: 750 mg/dL — ABNORMAL HIGH (ref 210–475)

## 2019-02-19 LAB — PROTIME-INR
INR: 3 — ABNORMAL HIGH (ref 0.8–1.2)
Prothrombin Time: 31 seconds — ABNORMAL HIGH (ref 11.4–15.2)

## 2019-02-19 LAB — FERRITIN: Ferritin: 247 ng/mL (ref 24–336)

## 2019-02-19 LAB — C-REACTIVE PROTEIN: CRP: 13.7 mg/dL — ABNORMAL HIGH (ref <1.0)

## 2019-02-19 MED ORDER — HYDROCOD POLST-CPM POLST ER 10-8 MG/5ML PO SUER: 5.0000 mL | Freq: Two times a day (BID) | ORAL | Status: DC | PRN

## 2019-02-19 MED ORDER — DIGOXIN 125 MCG PO TABS
0.1250 mg | ORAL_TABLET | Freq: Every day | ORAL | Status: DC
  Administered 2019-02-19 – 2019-02-20 (×2): 0.125 mg via ORAL
  Filled 2019-02-19 (×3): qty 1

## 2019-02-19 MED ORDER — SPIRONOLACTONE 25 MG PO TABS
25.0000 mg | ORAL_TABLET | Freq: Every day | ORAL | Status: DC
  Administered 2019-02-19 – 2019-02-20 (×2): 25 mg via ORAL
  Filled 2019-02-19 (×3): qty 1

## 2019-02-19 MED ORDER — ZINC SULFATE 220 (50 ZN) MG PO CAPS
220.0000 mg | ORAL_CAPSULE | Freq: Every day | ORAL | Status: DC
  Administered 2019-02-19 – 2019-02-20 (×2): 220 mg via ORAL
  Filled 2019-02-19 (×2): qty 1

## 2019-02-19 MED ORDER — GUAIFENESIN-DM 100-10 MG/5ML PO SYRP: 10.0000 mL | ORAL_SOLUTION | ORAL | Status: DC | PRN

## 2019-02-19 MED ORDER — VITAMIN C 500 MG PO TABS
500.0000 mg | ORAL_TABLET | Freq: Every day | ORAL | Status: DC
  Administered 2019-02-19 – 2019-02-20 (×2): 500 mg via ORAL
  Filled 2019-02-19 (×2): qty 1

## 2019-02-19 MED ORDER — PRIMIDONE 50 MG PO TABS
50.0000 mg | ORAL_TABLET | Freq: Every day | ORAL | Status: DC
  Administered 2019-02-19: 50 mg via ORAL
  Filled 2019-02-19 (×2): qty 1

## 2019-02-19 MED ORDER — LEVOTHYROXINE SODIUM 50 MCG PO TABS
50.0000 ug | ORAL_TABLET | Freq: Every day | ORAL | Status: DC
  Administered 2019-02-20: 08:00:00 50 ug via ORAL
  Filled 2019-02-19: qty 1

## 2019-02-19 MED ORDER — WARFARIN - PHARMACIST DOSING INPATIENT: Freq: Every day | Status: DC

## 2019-02-19 MED ORDER — METFORMIN HCL 500 MG PO TABS
1000.0000 mg | ORAL_TABLET | Freq: Two times a day (BID) | ORAL | Status: DC
  Administered 2019-02-19 – 2019-02-20 (×2): 1000 mg via ORAL
  Filled 2019-02-19 (×2): qty 2

## 2019-02-19 MED ORDER — METOPROLOL SUCCINATE ER 25 MG PO TB24
75.0000 mg | ORAL_TABLET | Freq: Every day | ORAL | Status: DC
  Administered 2019-02-19 – 2019-02-20 (×2): 75 mg via ORAL
  Filled 2019-02-19 (×2): qty 3

## 2019-02-19 MED ORDER — OMEGA-3-ACID ETHYL ESTERS 1 G PO CAPS
1.0000 g | ORAL_CAPSULE | Freq: Every day | ORAL | Status: DC
  Administered 2019-02-19 – 2019-02-20 (×2): 1 g via ORAL
  Filled 2019-02-19 (×3): qty 1

## 2019-02-19 MED ORDER — DEXAMETHASONE 6 MG PO TABS
6.0000 mg | ORAL_TABLET | Freq: Every day | ORAL | Status: DC
  Administered 2019-02-19 – 2019-02-20 (×2): 6 mg via ORAL
  Filled 2019-02-19 (×2): qty 1

## 2019-02-19 MED ORDER — OMEGA-3 1000 MG PO CAPS: 1000.0000 mg | ORAL_CAPSULE | Freq: Every day | ORAL | Status: DC

## 2019-02-19 MED ORDER — ACETAMINOPHEN 325 MG PO TABS: 650.0000 mg | ORAL_TABLET | Freq: Four times a day (QID) | ORAL | Status: DC | PRN

## 2019-02-19 MED ORDER — PROCHLORPERAZINE EDISYLATE 10 MG/2ML IJ SOLN: 5.0000 mg | INTRAMUSCULAR | Status: DC | PRN

## 2019-02-19 MED ORDER — PANTOPRAZOLE SODIUM 40 MG PO TBEC
40.0000 mg | DELAYED_RELEASE_TABLET | Freq: Every day | ORAL | Status: DC
  Administered 2019-02-19 – 2019-02-20 (×2): 40 mg via ORAL
  Filled 2019-02-19 (×2): qty 1

## 2019-02-19 MED ORDER — MECLIZINE HCL 12.5 MG PO TABS
12.5000 mg | ORAL_TABLET | Freq: Three times a day (TID) | ORAL | Status: DC | PRN
  Filled 2019-02-19: qty 1

## 2019-02-19 NOTE — Evaluation (Signed)
Physical Therapy Evaluation Patient Details Name: Mitchell Murray MRN: [id number] DOB: [birth year] Today's Date: 02/19/2019   History of Present Illness  HPI: Mitchell Murray is a 78 y.o. male with medical history significant of CAD, history of MI, history of atrial fibrillation, history of AICD/pacemaker placement, chronic systolic heart failure, hypertension, hyperlipidemia, type 2 diabetes, hypothyroidism, cholelithiasis, prostate cancer who is referred from around the hospital emergency department after presenting there with significant weakness and body aches 1 day after being discharged from [facility] due to SBO, covid positive but no symptoms.  Clinical Impression  Patient reports feeling better. ambualted x 400' on RA, SPO2 96%. Gait and balance WFL. Patient reports his son plans to be nearby to assist as needd. Recommend HHPT for increased activity and ensure progressing. Pt admitted with above diagnosis.  Pt currently with functional limitations due to the deficits listed below (see PT Problem List). Pt will benefit from skilled PT to increase their independence and safety with mobility to allow discharge to the venue listed below.       Follow Up Recommendations Home health PT    Equipment Recommendations  None recommended by PT    Recommendations for Other Services       Precautions / Restrictions Precautions Precautions: Fall      Mobility  Bed Mobility Overal bed mobility: Independent                Transfers Overall transfer level: Independent                  Ambulation/Gait Ambulation/Gait assistance: Supervision Gait Distance (Feet): 400 Feet Assistive device: Rolling walker (2 wheeled);None Gait Pattern/deviations: Step-through pattern   Gait velocity interpretation: >2.62 ft/sec, indicative of community ambulatory General Gait Details: ambulated x 100' with RW,  at times held in air. then no RW x 250'.  Stairs            Wheelchair  Mobility    Modified Rankin (Stroke Patients Only)       Balance Overall balance assessment: No apparent balance deficits (not formally assessed)                                           Pertinent Vitals/Pain      Home Living Family/patient expects to be discharged to:: Private residence Living Arrangements: Alone Available Help at Discharge: Family Type of Home: House Home Access: Stairs to enter Entrance Stairs-Rails: Right Entrance Stairs-Number of Steps: 3 Home Layout: One level Home Equipment: None Additional Comments: pt. states step son plans to be close to pt at DC and help out as needed.    Prior Function Level of Independence: Independent         Comments: still drives     Hand Dominance        Extremity/Trunk Assessment   Upper Extremity Assessment Upper Extremity Assessment: Generalized weakness    Lower Extremity Assessment Lower Extremity Assessment: Generalized weakness    Cervical / Trunk Assessment Cervical / Trunk Assessment: Normal  Communication   Communication: No difficulties  Cognition Arousal/Alertness: Awake/alert Behavior During Therapy: WFL for tasks assessed/performed Overall Cognitive Status: Within Functional Limits for tasks assessed  General Comments      Exercises     Assessment/Plan    PT Assessment Patient needs continued PT services(if remains in hosp.)  PT Problem List Decreased strength;Decreased activity tolerance       PT Treatment Interventions Gait training    PT Goals (Current goals can be found in the Care Plan section)  Acute Rehab PT Goals Patient Stated Goal: go home, feel better. PT Goal Formulation: With patient Time For Goal Achievement: 03/05/19 Potential to Achieve Goals: Good    Frequency Min 3X/week   Barriers to discharge        Co-evaluation               AM-PAC PT "6 Clicks" Mobility  Outcome  Measure Help needed turning from your back to your side while in a flat bed without using bedrails?: None Help needed moving from lying on your back to sitting on the side of a flat bed without using bedrails?: None Help needed moving to and from a bed to a chair (including a wheelchair)?: None Help needed standing up from a chair using your arms (e.g., wheelchair or bedside chair)?: None Help needed to walk in hospital room?: A Little Help needed climbing 3-5 steps with a railing? : A Little 6 Click Score: 22    End of Session   Activity Tolerance: Patient tolerated treatment well Patient left: in chair;with call bell/phone within reach Nurse Communication: Mobility status PT Visit Diagnosis: Difficulty in walking, not elsewhere classified (R26.2)    Time: 1536-1600 PT Time Calculation (min) (ACUTE ONLY): 24 min   Charges:   PT Evaluation $PT Eval Low Complexity: 1 Low PT Treatments $Gait Training: 8-22 mins        [place]  Office [phone]   Claretha Cooper 02/19/2019, 4:24 PM

## 2019-02-19 NOTE — Progress Notes (Signed)
ANTICOAGULATION CONSULT NOTE - Initial Consult  Pharmacy Consult for Warfarin Indication: atrial fibrillation  Allergies  Allergen Reactions  . Ivp Dye [Iodinated Diagnostic Agents] Anaphylaxis  . Metrizamide Anaphylaxis    Patient Measurements: Height: 5\' 10"  (177.8 cm) Weight: 136 lb 0.4 oz (61.7 kg) IBW/kg (Calculated) : 73  Vital Signs: Temp: 97.9 F (36.6 C) (10/21 0559) Temp Source: Oral (10/21 0559) BP: 153/64 (10/21 0559) Pulse Rate: 78 (10/21 0559)  Labs: Recent Labs    02/16/19 0841 02/17/19 0347  HGB 10.4* 10.7*  HCT 32.4* 33.4*  PLT 201 227  LABPROT  --  29.3*  INR  --  2.8*  CREATININE 1.06 0.84    Estimated Creatinine Clearance: 63.3 mL/min (by C-G formula based on SCr of 0.84 mg/dL).   Medical History: Past Medical History:  Diagnosis Date  . Automatic implantable cardioverter-defibrillator in situ   . Cardiomegaly   . CHF (congestive heart failure) ([place])   . Coronary artery disease   . Dysrhythmia    atrial fib  . GERD (gastroesophageal reflux disease)   . Hard of hearing   . Hyperlipidemia   . Hypertension   . Hypothyroidism   . Kidney stones   . MI (myocardial infarction) ([place])   . Pacemaker   . Prostate cancer ([place])     Medications:  Scheduled:  . vitamin C  500 mg Oral Daily  . zinc sulfate  220 mg Oral Daily   Infusions:    Assessment: 78 yo admitted from [facility] ED on 10/21 with COVID-19 pneumonia.  PMH includes warfarin PTA for Afib.  Recently admitted to [facility] with SBO - resolved as of 10/19.  Pharmacy consulted to resume warfarin dosing. Admission INR per MD was 2.5 at [facility] ED PTA Warfarin dose:  4mg  daily except 2mg  on Tuesday/Thursday.  Last dose unknown.  Today, 02/19/2019: INR 3 CBC:  Hgb 11.3, Plt 277 Diet: heart healthy, carb modified.  Recently resolved SBO, no meal intake charted yet. DDI:  dexamethasone   Goal of Therapy:  INR 2-3 Monitor platelets by anticoagulation protocol: Yes   Plan:  Hold  warfarin dose today given INR at top of range. Daily PT/INR. Monitor for signs and symptoms of bleeding.   Gretta Arab PharmD, BCPS Clinical pharmacist phone 7am- 5pm: [phone] 02/19/2019 7:13 AM

## 2019-02-19 NOTE — Plan of Care (Signed)
Patient admitted from [facility] ED for continued fever and weakness after positive COVID test. Patient afebrile on arrival.

## 2019-02-19 NOTE — Progress Notes (Signed)
Sister, Ilona Sorrel, called and updated on patient condition and plan of care.  Discharge plan discussed.  Sister appreciative of information.  States she will call other family that has been calling.

## 2019-02-19 NOTE — H&P (Signed)
History and Physical    Mitchell Murray [id number] DOB: Dec 03, 1940 DOA: 02/19/2019  PCP: Ocie Doyne., MD   Patient coming from: Home.  I have personally briefly reviewed patient's old medical records in [place]  Chief Complaint: Weakness.  HPI: Mitchell Murray is a 78 y.o. male with medical history significant of CAD, history of MI, history of atrial fibrillation, history of AICD/pacemaker placement, chronic systolic heart failure, hypertension, hyperlipidemia, type 2 diabetes, hypothyroidism, cholelithiasis, prostate cancer who is referred from around the hospital emergency department after presenting there with significant weakness and body aches 1 day after being discharged from [facility] due to SBO.  The patient states that he had a high fever at home, but denies headache, rhinorrhea, taste/smell changes, sore throat, cough, wheezing or hemoptysis.  He denies dyspnea, lower extremity pitting edema, PND, orthopnea, abdominal pain, nausea, vomiting, diarrhea, constipation, melena or hematochezia.  No dysuria, frequency hematuria.  Denies polyuria, polydipsia, polyphagia or blurred vision.  ED Course: While the patient was in the ED, he had an episode of chest pain which resolved with sublingual nitroglycerin and 40 mg of IV furosemide.  Troponin x2 was mildly elevated.  EKGs did not have any new changes.  He is proBNP was 7480.  This issue was presented to cardiology who did not recommend any further testing.  The rest of the labs in the emergency department showed a white count of 10.4, hemoglobin 11.8 g/dL and platelets 283.  PT was 25.6 and APTT 37.0 seconds.  INR was 2.5.  Lactic acid initially was 2.2 but follow-up was 1.3 mmol/L.  BNP was normal, except for a mildly elevated nonfasting glucose of 123 mg/dL.  Total protein, albumin and alkaline phosphatase were normal.  AST was 80 and ALT 80 as well.  Total bilirubin was mildly elevated at 1.5 mg/dL.  LDH was 647 and CRP 68.4.   Review of Systems: As per HPI otherwise 10 point review of systems negative.   Past Medical History:  Diagnosis Date  . Automatic implantable cardioverter-defibrillator in situ   . Cardiomegaly   . CHF (congestive heart failure) ([place])   . Coronary artery disease   . Dysrhythmia    atrial fib  . GERD (gastroesophageal reflux disease)   . Hard of hearing   . Hyperlipidemia   . Hypertension   . Hypothyroidism   . Kidney stones   . MI (myocardial infarction) ([place])   . Pacemaker   . Prostate cancer [facility])     Past Surgical History:  Procedure Laterality Date  . CARDIAC DEFIBRILLATOR PLACEMENT    . CYSTOSCOPY WITH LITHOLAPAXY N/A 01/24/2013   Procedure: CYSTOSCOPY WITH LITHOLAPAXY;  Surgeon: Ardis Hughs, MD;  Location: [facility] ORS;  Service: Urology;  Laterality: N/A;  . HERNIA REPAIR    . HOLMIUM LASER APPLICATION N/A [id number]   Procedure: HOLMIUM LASER APPLICATION;  Surgeon: Ardis Hughs, MD;  Location: [facility] ORS;  Service: Urology;  Laterality: N/A;  . PACEMAKER INSERTION    . PROSTATE SURGERY    . PROSTATECTOMY       reports that he has never smoked. He has never used smokeless tobacco. He reports that he does not drink alcohol or use drugs.  Allergies  Allergen Reactions  . Ivp Dye [Iodinated Diagnostic Agents] Anaphylaxis  . Metrizamide Anaphylaxis    Family History  Problem Relation Age of Onset  . Heart failure Mother   . Cancer Father   . Heart failure Sister    Prior  to Admission medications   Medication Sig Start Date End Date Taking? Authorizing Provider  atorvastatin (LIPITOR) 20 MG tablet Take 20 mg by mouth every evening.     [provider]  dexamethasone (DECADRON) 6 MG tablet Take 1 tablet (6 mg total) by mouth daily for 8 days. 02/18/19 02/26/19  Mariel Aloe, MD  digoxin (LANOXIN) 0.125 MG tablet Take 0.125 mg by mouth daily.    [provider]  levothyroxine (SYNTHROID, LEVOTHROID) 50 MCG tablet Take 50 mcg by mouth daily  before breakfast.    [provider]  meclizine (ANTIVERT) 25 MG tablet Take 12.5 mg by mouth 3 (three) times daily as needed for dizziness.     [provider]  metFORMIN (GLUCOPHAGE) 1000 MG tablet Take 1,000 mg by mouth 2 (two) times daily.    [provider]  metoprolol succinate (TOPROL-XL) 50 MG 24 hr tablet Take 75 mg by mouth daily. Take with or immediately following a meal.     [provider]  Omega-3 1000 MG CAPS Take 1,000 mg by mouth daily.     [provider]  omeprazole (PRILOSEC) 40 MG capsule Take 40 mg by mouth daily as needed (Heart Burn).     [provider]  potassium phosphate, monobasic, (K-PHOS ORIGINAL) 500 MG tablet Take 1 tablet (500 mg total) by mouth 3 (three) times daily with meals for 2 doses. 02/18/19 02/19/19  Mariel Aloe, MD  primidone (MYSOLINE) 50 MG tablet Take 50 mg by mouth at bedtime.    [provider]  sertraline (ZOLOFT) 25 MG tablet Take 25 mg by mouth daily as needed. Depression 11/12/17   [provider]  spironolactone (ALDACTONE) 25 MG tablet Take 25 mg by mouth daily. 03/26/17   [provider]  tamsulosin (FLOMAX) 0.4 MG CAPS capsule Take 1 capsule (0.4 mg total) by mouth daily. Patient not taking: Reported on 01/21/2018 08/26/17   Palumbo, April, MD  warfarin (COUMADIN) 4 MG tablet Take 2-4 mg by mouth See admin instructions. Tuesday and Thursday 2mg ; Monday Wed Friday Sat Sun 4mg     [provider]    Physical Exam: Vitals:   02/19/19 0559 02/19/19 0605  BP: (!) 153/64   Pulse: 78   Resp: 15   Temp: 97.9 F (36.6 C)   TempSrc: Oral   SpO2: 100%   Weight:  61.7 kg  Height:  5\' 10"  (1.778 m)    Constitutional: NAD, calm, comfortable Eyes: PERRL, lids and conjunctivae normal ENMT: Mucous membranes are moist. Posterior pharynx clear of any exudate or lesions. Neck: normal, supple, no masses, no thyromegaly Respiratory: clear to auscultation  bilaterally, no wheezing, no crackles. Normal respiratory effort. No accessory muscle use.  Cardiovascular: Regular rate and rhythm, no murmurs / rubs / gallops. No extremity edema. 2+ pedal pulses. No carotid bruits.  Abdomen: Nondistended. Bowel sounds positive.   Soft, no tenderness, no masses palpated. No hepatosplenomegaly.  Musculoskeletal: no clubbing / cyanosis.  Good ROM, no contractures. Normal muscle tone.  Skin: no rashes, lesions, ulcers. No induration on very limited dermatological examination. Neurologic: CN 2-12 grossly intact. Sensation intact, DTR normal.  Generalized weakness. Psychiatric: Normal judgment and insight. Alert and oriented x 3. Normal mood.   Labs on Admission: I have personally reviewed following labs and imaging studies  CBC: Recent Labs  Lab 02/13/19 2354 02/14/19 0414 02/15/19 0444 02/16/19 0841 02/17/19 0347  WBC 6.7 6.1 5.0 4.8 2.7*  NEUTROABS 3.9  --   --  3.5 1.8  HGB 12.5* 10.8* 11.1* 10.4* 10.7*  HCT 39.0 34.6* 34.9* 32.4* 33.4*  MCV 94.4 94.8 93.1 92.0 91.8  PLT 188 174 199 201 [id number]   Basic Metabolic Panel: Recent Labs  Lab 02/13/19 2354 02/14/19 0414 02/16/19 0841 02/17/19 0347  NA 139 138 138 137  K 4.7 4.2 4.2 4.3  CL 105 108 108 109  CO2 24 23 22 23   GLUCOSE 89 85 125* 215*  BUN 20 20 11 17   CREATININE 1.08 0.92 1.06 0.84  CALCIUM 9.2 8.5* 8.3* 8.6*  MG  --   --   --  2.0  PHOS  --   --   --  1.3*   GFR: Estimated Creatinine Clearance: 63.3 mL/min (by C-G formula based on SCr of 0.84 mg/dL). Liver Function Tests: Recent Labs  Lab 02/13/19 2354 02/16/19 0841 02/17/19 0347  AST 22 28 40  ALT 15 20 36  ALKPHOS 40 43 45  BILITOT 0.9 1.9* 1.2  PROT 7.3 6.4* 6.7  ALBUMIN 3.7 3.0* 3.1*   No results for input(s): LIPASE, AMYLASE in the last 168 hours. No results for input(s): AMMONIA in the last 168 hours. Coagulation Profile: Recent Labs  Lab 02/13/19 2354 02/15/19 0444 02/16/19 0430 02/17/19 0347  INR 2.8*  3.5* 3.0* 2.8*   Cardiac Enzymes: No results for input(s): CKTOTAL, CKMB, CKMBINDEX, TROPONINI in the last 168 hours. BNP (last 3 results) No results for input(s): PROBNP in the last 8760 hours. HbA1C: No results for input(s): HGBA1C in the last 72 hours. CBG: Recent Labs  Lab 02/16/19 2035 02/17/19 0028 02/17/19 0447 02/17/19 0912 02/17/19 1155  GLUCAP 267* 247* 212* 161* 129*   Lipid Profile: No results for input(s): CHOL, HDL, LDLCALC, TRIG, CHOLHDL, LDLDIRECT in the last 72 hours. Thyroid Function Tests: No results for input(s): TSH, T4TOTAL, FREET4, T3FREE, THYROIDAB in the last 72 hours. Anemia Panel: Recent Labs    02/16/19 0841 02/17/19 0347  FERRITIN 360* 319   Urine analysis:    Component Value Date/Time   COLORURINE BROWN (A) 04/08/2018 0112   APPEARANCEUR CLOUDY (A) 04/08/2018 0112   LABSPEC 1.019 04/08/2018 0112   PHURINE 5.0 04/08/2018 0112   GLUCOSEU NEGATIVE 04/08/2018 0112   HGBUR LARGE (A) 04/08/2018 0112   BILIRUBINUR NEGATIVE 04/08/2018 0112   KETONESUR NEGATIVE 04/08/2018 0112   PROTEINUR 100 (A) 04/08/2018 0112   UROBILINOGEN 0.2 12/02/2012 1151   NITRITE NEGATIVE 04/08/2018 0112   LEUKOCYTESUR NEGATIVE 04/08/2018 0112    Radiological Exams on Admission: No results found.  EKG: Independently reviewed.   Assessment/Plan Principal Problem:   2019 novel coronavirus disease (COVID-19) Observation/telemetry. No respiratory distress, but had very elevated inflammatory markers. We will repeat a chest radiograph, since there is no report from [facility] ED. Check CBC, CMP and inflammatory markers. Continue daily oral dexamethasone. Analgesics as needed. Antitussives as needed.  Active Problems:   Weakness generalized Secondary to SARS 2 coronavirus disease. Continue supportive care. May need physical therapy.    Chronic systolic congestive heart failure (HCC) No signs of decompensation at this time. Received furosemide 40 mg IVP  yesterday [facility] ED. Continue spironolactone 25 mg p.o. daily. Resume extended release metoprolol 75 mg p.o. daily.    CAD (coronary artery disease) Continue statin, beta-blocker and anticoagulation.    A-fib (HCC) CHA?DS?-VASc Score of at least 6. Continue metoprolol for rate control. Warfarin per pharmacy.    Essential hypertension Continue metoprolol and spironolactone. Monitor BP, HR, renal function electrolytes.    Type  2 diabetes mellitus ([place]) CBG monitoring before meals. Continue metformin. Add RI SS if blood glucose increases.    Hypothyroidism Continue levothyroxine 50 mcg p.o. daily.    HLD (hyperlipidemia) Continue atorvastatin 20 mg p.o. in the evening. Monitor LFTs closely since they are mildly elevated.    GERD (gastroesophageal reflux disease) Continue PPI.     DVT prophylaxis: On warfarin. Code Status: DNR. Family Communication: Disposition Plan: Observation for COVID-19 treatment. Consults called: Admission status: Telemetry/observation.   Reubin Milan MD [name]  If 7PM-7AM, please contact night-coverage  02/19/2019, 6:47 AM   This document was prepared using Dragon voice recognition software and may contain some unintended transcription errors.

## 2019-02-19 NOTE — Progress Notes (Signed)
Progress Note    Mitchell Murray [id number] DOB: [birth year] DOA: 02/19/2019  PCP: Ocie Doyne., MD   Patient coming from: Home.  I have personally briefly reviewed patient's old medical records in [place]  Chief Complaint: Weakness.  HPI: Mitchell Murray is a 78 y.o. male with medical history significant of CAD, history of MI, history of atrial fibrillation, history of AICD/pacemaker placement, chronic systolic heart failure, hypertension, hyperlipidemia, type 2 diabetes, hypothyroidism, cholelithiasis, prostate cancer who is referred from around the hospital emergency department after presenting there with significant weakness and body aches 1 day after being discharged from [facility] due to SBO.  The patient states that he had a high fever at home, but denies headache, rhinorrhea, taste/smell changes, sore throat, cough, wheezing or hemoptysis.  He denies dyspnea, lower extremity pitting edema, PND, orthopnea, abdominal pain, nausea, vomiting, diarrhea, constipation, melena or hematochezia.  No dysuria, frequency hematuria.  Denies polyuria, polydipsia, polyphagia or blurred vision. While the patient was in the ED, he had an episode of chest pain which resolved with sublingual nitroglycerin and 40 mg of IV furosemide.  Troponin x2 was mildly elevated.  EKGs did not have any new changes.  He is proBNP was 7480.  This issue was presented to cardiology who did not recommend any further testing.  Review of Systems: As per HPI otherwise 10 point review of systems negative.   Past Medical History:  Diagnosis Date  . Automatic implantable cardioverter-defibrillator in situ   . Cardiomegaly   . CHF (congestive heart failure) ([place])   . Coronary artery disease   . Dysrhythmia    atrial fib  . GERD (gastroesophageal reflux disease)   . Hard of hearing   . Hyperlipidemia   . Hypertension   . Hypothyroidism   . Kidney stones   . MI (myocardial infarction) ([place])   . Pacemaker   .  Prostate cancer [facility])     Past Surgical History:  Procedure Laterality Date  . CARDIAC DEFIBRILLATOR PLACEMENT    . CYSTOSCOPY WITH LITHOLAPAXY N/A 01/24/2013   Procedure: CYSTOSCOPY WITH LITHOLAPAXY;  Surgeon: Ardis Hughs, MD;  Location: [facility] ORS;  Service: Urology;  Laterality: N/A;  . HERNIA REPAIR    . HOLMIUM LASER APPLICATION N/A [id number]   Procedure: HOLMIUM LASER APPLICATION;  Surgeon: Ardis Hughs, MD;  Location: [facility] ORS;  Service: Urology;  Laterality: N/A;  . PACEMAKER INSERTION    . PROSTATE SURGERY    . PROSTATECTOMY       reports that he has never smoked. He has never used smokeless tobacco. He reports that he does not drink alcohol or use drugs.  Allergies  Allergen Reactions  . Ivp Dye [Iodinated Diagnostic Agents] Anaphylaxis  . Metrizamide Anaphylaxis    Family History  Problem Relation Age of Onset  . Heart failure Mother   . Cancer Father   . Heart failure Sister    Prior to Admission medications   Medication Sig Start Date End Date Taking? Authorizing Provider  atorvastatin (LIPITOR) 20 MG tablet Take 20 mg by mouth every evening.     [provider]  dexamethasone (DECADRON) 6 MG tablet Take 1 tablet (6 mg total) by mouth daily for 8 days. 02/18/19 02/26/19  Mariel Aloe, MD  digoxin (LANOXIN) 0.125 MG tablet Take 0.125 mg by mouth daily.    [provider]  levothyroxine (SYNTHROID, LEVOTHROID) 50 MCG tablet Take 50 mcg by mouth daily before breakfast.    [provider]  meclizine (ANTIVERT) 25 MG tablet Take 12.5 mg by mouth 3 (three) times daily as needed for dizziness.     [provider]  metFORMIN (GLUCOPHAGE) 1000 MG tablet Take 1,000 mg by mouth 2 (two) times daily.    [provider]  metoprolol succinate (TOPROL-XL) 50 MG 24 hr tablet Take 75 mg by mouth daily. Take with or immediately following a meal.     [provider]  Omega-3 1000 MG CAPS Take 1,000 mg by mouth daily.      [provider]  omeprazole (PRILOSEC) 40 MG capsule Take 40 mg by mouth daily as needed (Heart Burn).     [provider]  potassium phosphate, monobasic, (K-PHOS ORIGINAL) 500 MG tablet Take 1 tablet (500 mg total) by mouth 3 (three) times daily with meals for 2 doses. 02/18/19 02/19/19  Mariel Aloe, MD  primidone (MYSOLINE) 50 MG tablet Take 50 mg by mouth at bedtime.    [provider]  sertraline (ZOLOFT) 25 MG tablet Take 25 mg by mouth daily as needed. Depression 11/12/17   [provider]  spironolactone (ALDACTONE) 25 MG tablet Take 25 mg by mouth daily. 03/26/17   [provider]  tamsulosin (FLOMAX) 0.4 MG CAPS capsule Take 1 capsule (0.4 mg total) by mouth daily. Patient not taking: Reported on 01/21/2018 08/26/17   Palumbo, April, MD  warfarin (COUMADIN) 4 MG tablet Take 2-4 mg by mouth See admin instructions. Tuesday and Thursday 2mg ; Monday Wed Friday Sat Sun 4mg     [provider]    Physical Exam: Vitals:   02/19/19 0559 02/19/19 0605 02/19/19 0747  BP: (!) 153/64  (!) 143/81  Pulse: 78  75  Resp: 15  19  Temp: 97.9 F (36.6 C)  97.9 F (36.6 C)  TempSrc: Oral  Oral  SpO2: 100%  98%  Weight:  61.7 kg   Height:  5\' 10"  (1.778 m)     Constitutional: NAD, calm, comfortable Eyes: PERRL, lids and conjunctivae normal ENMT: Mucous membranes are moist. Posterior pharynx clear of any exudate or lesions. Neck: normal, supple, no masses, no thyromegaly Respiratory: clear to auscultation bilaterally, no wheezing, no crackles. Normal respiratory effort. No accessory muscle use.  Cardiovascular: Regular rate and rhythm, no murmurs / rubs / gallops. No extremity edema. 2+ pedal pulses. No carotid bruits.  Abdomen: Nondistended. Bowel sounds positive.   Soft, no tenderness, no masses palpated. No hepatosplenomegaly.  Musculoskeletal: no clubbing / cyanosis.  Good ROM, no contractures. Normal muscle tone.  Skin: no rashes,  lesions, ulcers. No induration on very limited dermatological examination. Neurologic: CN 2-12 grossly intact. Sensation intact, DTR normal.  Generalized weakness. Psychiatric: Normal judgment and insight. Alert and oriented x 3. Normal mood.   Labs on Admission: I have personally reviewed following labs and imaging studies  CBC: Recent Labs  Lab 02/13/19 2354 02/14/19 0414 02/15/19 0444 02/16/19 0841 02/17/19 0347 02/19/19 0720  WBC 6.7 6.1 5.0 4.8 2.7* 5.1  NEUTROABS 3.9  --   --  3.5 1.8 4.1  HGB 12.5* 10.8* 11.1* 10.4* 10.7* 11.3*  HCT 39.0 34.6* 34.9* 32.4* 33.4* 33.5*  MCV 94.4 94.8 93.1 92.0 91.8 89.1  PLT 188 174 199 201 227 [id number]   Basic Metabolic Panel: Recent Labs  Lab 02/13/19 2354 02/14/19 0414 02/16/19 0841 02/17/19 0347 02/19/19 0720  NA 139 138 138 137 138  K 4.7 4.2 4.2 4.3 3.9  CL 105 108 108 109 102  CO2 24 23  22 23 24   GLUCOSE 89 85 125* 215* 169*  BUN 20 20 11 17  32*  CREATININE 1.08 0.92 1.06 0.84 1.16  CALCIUM 9.2 8.5* 8.3* 8.6* 8.8*  MG  --   --   --  2.0  --   PHOS  --   --   --  1.3*  --    GFR: Estimated Creatinine Clearance: 45.8 mL/min (by C-G formula based on SCr of 1.16 mg/dL). Liver Function Tests: Recent Labs  Lab 02/13/19 2354 02/16/19 0841 02/17/19 0347 02/19/19 0720  AST 22 28 40 34  ALT 15 20 36 55*  ALKPHOS 40 43 45 49  BILITOT 0.9 1.9* 1.2 1.6*  PROT 7.3 6.4* 6.7 6.8  ALBUMIN 3.7 3.0* 3.1* 3.2*   No results for input(s): LIPASE, AMYLASE in the last 168 hours. No results for input(s): AMMONIA in the last 168 hours. Coagulation Profile: Recent Labs  Lab 02/13/19 2354 02/15/19 0444 02/16/19 0430 02/17/19 0347 02/19/19 0720  INR 2.8* 3.5* 3.0* 2.8* 3.0*   Cardiac Enzymes: No results for input(s): CKTOTAL, CKMB, CKMBINDEX, TROPONINI in the last 168 hours. BNP (last 3 results) No results for input(s): PROBNP in the last 8760 hours. HbA1C: No results for input(s): HGBA1C in the last 72 hours. CBG: Recent Labs   Lab 02/17/19 0447 02/17/19 0912 02/17/19 1155 02/19/19 0745 02/19/19 1128  GLUCAP 212* 161* 129* 151* 259*   Lipid Profile: No results for input(s): CHOL, HDL, LDLCALC, TRIG, CHOLHDL, LDLDIRECT in the last 72 hours. Thyroid Function Tests: No results for input(s): TSH, T4TOTAL, FREET4, T3FREE, THYROIDAB in the last 72 hours. Anemia Panel: Recent Labs    02/17/19 0347 02/19/19 0720  FERRITIN 319 247   Urine analysis:    Component Value Date/Time   COLORURINE BROWN (A) 04/08/2018 0112   APPEARANCEUR CLOUDY (A) 04/08/2018 0112   LABSPEC 1.019 04/08/2018 0112   PHURINE 5.0 04/08/2018 0112   GLUCOSEU NEGATIVE 04/08/2018 0112   HGBUR LARGE (A) 04/08/2018 0112   BILIRUBINUR NEGATIVE 04/08/2018 0112   KETONESUR NEGATIVE 04/08/2018 0112   PROTEINUR 100 (A) 04/08/2018 0112   UROBILINOGEN 0.2 12/02/2012 1151   NITRITE NEGATIVE 04/08/2018 0112   LEUKOCYTESUR NEGATIVE 04/08/2018 0112    Radiological Exams on Admission: Dg Chest Port 1 View  Result Date: 02/19/2019 CLINICAL DATA:  COVID-19 EXAM: PORTABLE CHEST 1 VIEW COMPARISON:  02/18/2019 FINDINGS: Cardiomegaly with left chest multi lead pacer defibrillator. Both lungs are clear. The visualized skeletal structures are unremarkable. IMPRESSION: Cardiomegaly without acute abnormality of the lungs in AP portable projection. Electronically Signed   By: Eddie Candle M.D.   On: 02/19/2019 08:33    EKG: Independently reviewed.   Assessment/Plan   2019 novel coronavirus disease (COVID-19), without hypoxia No respiratory distress, CXR unremarkable but moderately elevated CRP as below Continue dexamethasone only, no indication for remdesivir, actemra, or plasma at this point Daily ambulatory O2 screens Recent Labs    02/17/19 0347 02/19/19 0720  DDIMER 0.44 0.53*  FERRITIN 319 247  CRP 13.3* 13.7*   Weakness generalized, in the setting of above Secondary to SARS 2 coronavirus disease. Continue supportive care. PT to follow    Chronic systolic congestive heart failure ([place]), not in acute exacerbation/decompensation Received furosemide 40 mg IVP yesterday [facility] ED. Continue spironolactone 25 mg p.o. daily. Resume extended release metoprolol 75 mg p.o. daily.  CAD (coronary artery disease) Continue statin, beta-blocker and anticoagulation.  A-fib (HCC) CHA?DS?-VASc Score of at least 6. Continue metoprolol for rate control. Warfarin per  pharmacy.  Essential hypertension, well controlled Continue metoprolol and spironolactone. Monitor BP, HR, renal function electrolytes.  Type 2 diabetes mellitus (HCC) CBG monitoring before meals. Continue metformin. Add RI SS if blood glucose increases.  Hypothyroidism Continue levothyroxine 50 mcg p.o. daily.  HLD (hyperlipidemia) Continue atorvastatin 20 mg p.o. in the evening. Monitor LFTs closely since they are mildly elevated.  GERD (gastroesophageal reflux disease) Continue PPI.   DVT prophylaxis: On warfarin. Code Status: DNR. Family Communication: Disposition Plan: Observation for COVID-19 treatment. Consults called: Admission status: Telemetry/observation.   Little Ishikawa DO [name]  If 7PM-7AM, please contact night-coverage  02/19/2019, 3:45 PM   This document was prepared using Dragon voice recognition software and may contain some unintended transcription errors.

## 2019-02-20 DIAGNOSIS — Z8249 Family history of ischemic heart disease and other diseases of the circulatory system: Secondary | ICD-10-CM | POA: Diagnosis not present

## 2019-02-20 DIAGNOSIS — I110 Hypertensive heart disease with heart failure: Secondary | ICD-10-CM | POA: Diagnosis not present

## 2019-02-20 DIAGNOSIS — Z95810 Presence of automatic (implantable) cardiac defibrillator: Secondary | ICD-10-CM | POA: Diagnosis not present

## 2019-02-20 DIAGNOSIS — E039 Hypothyroidism, unspecified: Secondary | ICD-10-CM | POA: Diagnosis not present

## 2019-02-20 DIAGNOSIS — R262 Difficulty in walking, not elsewhere classified: Secondary | ICD-10-CM | POA: Diagnosis not present

## 2019-02-20 DIAGNOSIS — Z7984 Long term (current) use of oral hypoglycemic drugs: Secondary | ICD-10-CM | POA: Diagnosis not present

## 2019-02-20 DIAGNOSIS — Z79899 Other long term (current) drug therapy: Secondary | ICD-10-CM | POA: Diagnosis not present

## 2019-02-20 DIAGNOSIS — E119 Type 2 diabetes mellitus without complications: Secondary | ICD-10-CM | POA: Diagnosis not present

## 2019-02-20 DIAGNOSIS — E785 Hyperlipidemia, unspecified: Secondary | ICD-10-CM | POA: Diagnosis not present

## 2019-02-20 DIAGNOSIS — Z7901 Long term (current) use of anticoagulants: Secondary | ICD-10-CM | POA: Diagnosis not present

## 2019-02-20 DIAGNOSIS — Z66 Do not resuscitate: Secondary | ICD-10-CM | POA: Diagnosis not present

## 2019-02-20 DIAGNOSIS — K219 Gastro-esophageal reflux disease without esophagitis: Secondary | ICD-10-CM | POA: Diagnosis not present

## 2019-02-20 DIAGNOSIS — R531 Weakness: Secondary | ICD-10-CM | POA: Diagnosis not present

## 2019-02-20 DIAGNOSIS — I2510 Atherosclerotic heart disease of native coronary artery without angina pectoris: Secondary | ICD-10-CM | POA: Diagnosis not present

## 2019-02-20 DIAGNOSIS — I252 Old myocardial infarction: Secondary | ICD-10-CM | POA: Diagnosis not present

## 2019-02-20 DIAGNOSIS — U071 COVID-19: Secondary | ICD-10-CM | POA: Diagnosis not present

## 2019-02-20 DIAGNOSIS — I4891 Unspecified atrial fibrillation: Secondary | ICD-10-CM | POA: Diagnosis not present

## 2019-02-20 DIAGNOSIS — I5022 Chronic systolic (congestive) heart failure: Secondary | ICD-10-CM | POA: Diagnosis not present

## 2019-02-20 LAB — GLUCOSE, CAPILLARY
Glucose-Capillary: 132 mg/dL — ABNORMAL HIGH (ref 70–99)
Glucose-Capillary: 156 mg/dL — ABNORMAL HIGH (ref 70–99)

## 2019-02-20 LAB — CBC WITH DIFFERENTIAL/PLATELET
Abs Immature Granulocytes: 0.05 10*3/uL (ref 0.00–0.07)
Basophils Absolute: 0 10*3/uL (ref 0.0–0.1)
Basophils Relative: 0 %
Eosinophils Absolute: 0 10*3/uL (ref 0.0–0.5)
Eosinophils Relative: 0 %
HCT: 38.1 % — ABNORMAL LOW (ref 39.0–52.0)
Hemoglobin: 12.7 g/dL — ABNORMAL LOW (ref 13.0–17.0)
Immature Granulocytes: 1 %
Lymphocytes Relative: 17 %
Lymphs Abs: 1.1 10*3/uL (ref 0.7–4.0)
MCH: 30 pg (ref 26.0–34.0)
MCHC: 33.3 g/dL (ref 30.0–36.0)
MCV: 90.1 fL (ref 80.0–100.0)
Monocytes Absolute: 0.6 10*3/uL (ref 0.1–1.0)
Monocytes Relative: 8 %
Neutro Abs: 5 10*3/uL (ref 1.7–7.7)
Neutrophils Relative %: 74 %
Platelets: 360 10*3/uL (ref 150–400)
RBC: 4.23 MIL/uL (ref 4.22–5.81)
RDW: 12.8 % (ref 11.5–15.5)
WBC: 6.7 10*3/uL (ref 4.0–10.5)
nRBC: 0 % (ref 0.0–0.2)

## 2019-02-20 LAB — COMPREHENSIVE METABOLIC PANEL
ALT: 65 U/L — ABNORMAL HIGH (ref 0–44)
AST: 37 U/L (ref 15–41)
Albumin: 3.3 g/dL — ABNORMAL LOW (ref 3.5–5.0)
Alkaline Phosphatase: 52 U/L (ref 38–126)
Anion gap: 14 (ref 5–15)
BUN: 36 mg/dL — ABNORMAL HIGH (ref 8–23)
CO2: 25 mmol/L (ref 22–32)
Calcium: 9.5 mg/dL (ref 8.9–10.3)
Chloride: 101 mmol/L (ref 98–111)
Creatinine, Ser: 1.11 mg/dL (ref 0.61–1.24)
GFR calc Af Amer: >60 mL/min (ref >60)
GFR calc non Af Amer: >60 mL/min (ref >60)
Glucose, Bld: 148 mg/dL — ABNORMAL HIGH (ref 70–99)
Potassium: 4.4 mmol/L (ref 3.5–5.1)
Sodium: 140 mmol/L (ref 135–145)
Total Bilirubin: 1.4 mg/dL — ABNORMAL HIGH (ref 0.3–1.2)
Total Protein: 7.4 g/dL (ref 6.5–8.1)

## 2019-02-20 LAB — PHOSPHORUS: Phosphorus: 3.1 mg/dL (ref 2.5–4.6)

## 2019-02-20 LAB — PROTIME-INR
INR: 2 — ABNORMAL HIGH (ref 0.8–1.2)
Prothrombin Time: 22.2 seconds — ABNORMAL HIGH (ref 11.4–15.2)

## 2019-02-20 LAB — C-REACTIVE PROTEIN: CRP: 9.3 mg/dL — ABNORMAL HIGH (ref <1.0)

## 2019-02-20 LAB — D-DIMER, QUANTITATIVE: D-Dimer, Quant: 0.65 ug/mL-FEU — ABNORMAL HIGH (ref 0.00–0.50)

## 2019-02-20 LAB — MAGNESIUM: Magnesium: 1.8 mg/dL (ref 1.7–2.4)

## 2019-02-20 LAB — FERRITIN: Ferritin: 510 ng/mL — ABNORMAL HIGH (ref 24–336)

## 2019-02-20 NOTE — TOC Transition Note (Signed)
Transition of Care [facility]) - CM/SW Discharge Note   Patient Details  Name: BIJAN DAUZAT MRN: [id number] Date of Birth: [birth year]  Transition of Care [facility]) CM/SW Contact:  Weston Anna, LCSW Phone Number: 02/20/2019, 11:47 AM   Clinical Narrative:     CSW spoke with patients sister, Martha Clan, regarding discharge plans for Home Health- sister states they have used [facility] in the past and would prefer to use their services again. CSW completed referral with [facility] and they are able to accept. Orders for PT placed by MD. No other needs at this time.   Final next level of care: [place] Barriers to Discharge: No Barriers Identified   Patient Goals and CMS Choice Patient states their goals for this hospitalization and ongoing recovery are:: getting back home CMS Medicare.gov Compare Post Acute Care list provided to:: Patient Represenative (must comment)(Laure) Choice offered to / list presented to : [place] / [place]  Discharge Placement                  Name of family member notified: Laure Patient and family notified of of transfer: 02/20/19  Discharge Plan and Services                DME Arranged: N/A         HH Arranged: PT [place] Agency: [facility] ([place]) Date HH Agency Contacted: 02/20/19 Time [place]: 1 Representative spoke with at [place]: [place] ([place]) Interventions     Readmission Risk Interventions No flowsheet data found.

## 2019-02-20 NOTE — Progress Notes (Signed)
Discharge instructions reviewed and patient verbalized understanding.  Signed form for DHHS.  IV was discontinued by Merrily Pew, RN.  Tolerated well.  Cardiac monitor removed and patient will be getting dressed for discharge.

## 2019-02-20 NOTE — Progress Notes (Signed)
Sister, Pamala Hurry, called and will be coming to pick patient up for discharge.  Will be driving white Honda CRV.

## 2019-02-20 NOTE — Progress Notes (Signed)
Discharged via WC to POV for transport to home with sister driving.  No s/s of distress at this time.  Belongings sent with patient including clothes and cell phone with charger.  Respirations even and unlabored.

## 2019-02-20 NOTE — Discharge Summary (Signed)
Physician Discharge Summary  Mitchell Murray [id number] DOB: 1940/08/05 DOA: 02/19/2019  PCP: Ocie Doyne., MD  Admit date: 02/19/2019 Discharge date: 02/20/2019  Admitted From: Home Disposition:  Home with homehealth  Recommendations for Outpatient Follow-up:  1. Follow up with PCP in 1-2 weeks 2. Please obtain BMP/CBC in one week  Home Health: Yes Discharge Condition: Fair  CODE STATUS: DNR Diet recommendation: As tolerated   Brief/Interim Summary: Mitchell Murray is a 78 y.o. male with medical history significant of CAD, history of MI, history of atrial fibrillation, history of AICD/pacemaker placement, chronic systolic heart failure, hypertension, hyperlipidemia, type 2 diabetes, hypothyroidism, cholelithiasis, prostate cancer who is referred from around the hospital emergency department after presenting there with significant weakness and body aches 1 day after being discharged from [facility] due to SBO.  The patient states that he had a high fever at home, but denies headache, rhinorrhea, taste/smell changes, sore throat, cough, wheezing or hemoptysis.  He denies dyspnea, lower extremity pitting edema, PND, orthopnea, abdominal pain, nausea, vomiting, diarrhea, constipation, melena or hematochezia.  No dysuria, frequency hematuria.  Denies polyuria, polydipsia, polyphagia or blurred vision. While the patient was in the ED, he had an episode of chest pain which resolved with sublingual nitroglycerin and 40 mg of IV furosemide.  Troponin x2 was mildly elevated.  EKGs did not have any new changes.  He is proBNP was 7480.  This issue was presented to cardiology who did not recommend any further testing.  Patient was without hypoxia, patient's fatigue and weakness in the setting of previous hospitalization as well as COVID-19 infection. Patient evaluated by PT/OT recommending home health with PT and otherwise patient feels moderately improved from previous. Patient will need close outpatient  follow up as previously scheduled from previous hospitalization.  Discharge Diagnoses:  Principal Problem:   2019 novel coronavirus disease (COVID-19) Active Problems:   CAD (coronary artery disease)   A-fib (HCC)   Hypothyroidism   Essential hypertension   HLD (hyperlipidemia)   GERD (gastroesophageal reflux disease)   Weakness generalized   Type 2 diabetes mellitus (HCC)   Chronic systolic congestive heart failure [facility])   Discharge Instructions  Discharge Instructions    Call MD for:  difficulty breathing, headache or visual disturbances   Complete by: As directed    Call MD for:  extreme fatigue   Complete by: As directed    Call MD for:  hives   Complete by: As directed    Call MD for:  persistant dizziness or light-headedness   Complete by: As directed    Call MD for:  redness, tenderness, or signs of infection (pain, swelling, redness, odor or green/yellow discharge around incision site)   Complete by: As directed    Call MD for:  severe uncontrolled pain   Complete by: As directed    Call MD for:  temperature >100.4   Complete by: As directed    Diet - low sodium heart healthy   Complete by: As directed    Increase activity slowly   Complete by: As directed      Allergies as of 02/20/2019      Reactions   Ivp Dye [iodinated Diagnostic Agents] Anaphylaxis   Metrizamide Anaphylaxis      Medication List    STOP taking these medications   potassium phosphate (monobasic) 500 MG tablet Commonly known as: K-PHOS ORIGINAL     TAKE these medications   atorvastatin 20 MG tablet Commonly known as: LIPITOR Take 20 mg  by mouth every evening.   dexamethasone 6 MG tablet Commonly known as: DECADRON Take 1 tablet (6 mg total) by mouth daily for 8 days.   digoxin 0.125 MG tablet Commonly known as: LANOXIN Take 0.125 mg by mouth daily.   levothyroxine 50 MCG tablet Commonly known as: SYNTHROID Take 50 mcg by mouth daily before breakfast.   meclizine 25 MG  tablet Commonly known as: ANTIVERT Take 12.5 mg by mouth 3 (three) times daily as needed for dizziness.   metFORMIN 1000 MG tablet Commonly known as: GLUCOPHAGE Take 1,000 mg by mouth 2 (two) times daily.   metoprolol succinate 50 MG 24 hr tablet Commonly known as: TOPROL-XL Take 75 mg by mouth daily. Take with or immediately following a meal.   Omega-3 1000 MG Caps Take 1,000 mg by mouth daily.   omeprazole 40 MG capsule Commonly known as: PRILOSEC Take 40 mg by mouth daily as needed (Heart Burn).   primidone 50 MG tablet Commonly known as: MYSOLINE Take 50 mg by mouth at bedtime.   sertraline 25 MG tablet Commonly known as: ZOLOFT Take 25 mg by mouth daily as needed. Depression   spironolactone 25 MG tablet Commonly known as: ALDACTONE Take 25 mg by mouth daily.   tamsulosin 0.4 MG Caps capsule Commonly known as: Flomax Take 1 capsule (0.4 mg total) by mouth daily.   warfarin 4 MG tablet Commonly known as: COUMADIN Take 2-4 mg by mouth See admin instructions. Tuesday and Thursday 2mg ; Monday Wed Friday Sat Sun 4mg        Allergies  Allergen Reactions  . Ivp Dye [Iodinated Diagnostic Agents] Anaphylaxis  . Metrizamide Anaphylaxis   Procedures/Studies: Dg Chest Port 1 View  Result Date: 02/19/2019 CLINICAL DATA:  COVID-19 EXAM: PORTABLE CHEST 1 VIEW COMPARISON:  02/18/2019 FINDINGS: Cardiomegaly with left chest multi lead pacer defibrillator. Both lungs are clear. The visualized skeletal structures are unremarkable. IMPRESSION: Cardiomegaly without acute abnormality of the lungs in AP portable projection. Electronically Signed   By: Eddie Candle M.D.   On: 02/19/2019 08:33   Dg Abd Acute 2+v W 1v Chest  Result Date: 02/14/2019 CLINICAL DATA:  Bowel obstruction EXAM: DG ABDOMEN ACUTE W/ 1V CHEST COMPARISON:  12/15/2013, 01/21/2018, CT 02/13/2019 FINDINGS: Single-view chest demonstrates left-sided pacing device. Mild cardiomegaly. Trace left pleural effusion or  thickening. No consolidation. Aortic atherosclerosis. Supine and decubitus views of the abdomen demonstrate no free air. Decreased small bowel distension within the central abdomen with few residual air filled distended central small bowel loops up to 3.5 cm. Increased distal bowel gas. IMPRESSION: 1. No radiographic evidence for acute cardiopulmonary abnormality 2. Decreased amount of small bowel distension when compared to scout image of CT. Electronically Signed   By: Donavan Foil M.D.   On: 02/14/2019 00:04    Subjective: No acute issues or events overnight   Discharge Exam: Vitals:   02/20/19 0435 02/20/19 0820  BP:  140/75  Pulse:  76  Resp:  13  Temp: 97.9 F (36.6 C) 98.7 F (37.1 C)  SpO2:  99%   Vitals:   02/19/19 1930 02/19/19 1945 02/20/19 0435 02/20/19 0820  BP:  (!) 126/96  140/75  Pulse: 75 77  76  Resp: 19   13  Temp:  97.9 F (36.6 C) 97.9 F (36.6 C) 98.7 F (37.1 C)  TempSrc:  Oral Oral Oral  SpO2: 99% 100%  99%  Weight:      Height:        General:  Pleasantly resting in bed, No acute distress. HEENT:  Normocephalic atraumatic.  Sclerae nonicteric, noninjected.  Extraocular movements intact bilaterally. Neck:  Without mass or deformity.  Trachea is midline. Lungs:  Clear to auscultate bilaterally without rhonchi, wheeze, or rales. Heart:  Regular rate and rhythm.  Without murmurs, rubs, or gallops. Abdomen:  Soft, nontender, nondistended.  Without guarding or rebound. Extremities: Without cyanosis, clubbing, edema, or obvious deformity. Vascular:  Dorsalis pedis and posterior tibial pulses palpable bilaterally. Skin:  Warm and dry, no erythema, no ulcerations.   The results of significant diagnostics from this hospitalization (including imaging, microbiology, ancillary and laboratory) are listed below for reference.     Microbiology: No results found for this or any previous visit (from the past 240 hour(s)).   Labs: BNP (last 3 results) Recent  Labs    02/19/19 0720  BNP [id number]*   Basic Metabolic Panel: Recent Labs  Lab 02/14/19 0414 02/16/19 0841 02/17/19 0347 02/19/19 0720 02/20/19 0230  NA 138 138 137 138 140  K 4.2 4.2 4.3 3.9 4.4  CL 108 108 109 102 101  CO2 23 22 23 24 25   GLUCOSE 85 125* 215* 169* 148*  BUN 20 11 17  32* 36*  CREATININE 0.92 1.06 0.84 1.16 1.11  CALCIUM 8.5* 8.3* 8.6* 8.8* 9.5  MG  --   --  2.0  --  1.8  PHOS  --   --  1.3*  --  3.1   Liver Function Tests: Recent Labs  Lab 02/13/19 2354 02/16/19 0841 02/17/19 0347 02/19/19 0720 02/20/19 0230  AST 22 28 40 34 37  ALT 15 20 36 55* 65*  ALKPHOS 40 43 45 49 52  BILITOT 0.9 1.9* 1.2 1.6* 1.4*  PROT 7.3 6.4* 6.7 6.8 7.4  ALBUMIN 3.7 3.0* 3.1* 3.2* 3.3*   No results for input(s): LIPASE, AMYLASE in the last 168 hours. No results for input(s): AMMONIA in the last 168 hours. CBC: Recent Labs  Lab 02/13/19 2354  02/15/19 0444 02/16/19 0841 02/17/19 0347 02/19/19 0720 02/20/19 0230  WBC 6.7   < > 5.0 4.8 2.7* 5.1 6.7  NEUTROABS 3.9  --   --  3.5 1.8 4.1 5.0  HGB 12.5*   < > 11.1* 10.4* 10.7* 11.3* 12.7*  HCT 39.0   < > 34.9* 32.4* 33.4* 33.5* 38.1*  MCV 94.4   < > 93.1 92.0 91.8 89.1 90.1  PLT 188   < > 199 201 227 277 360   < > = values in this interval not displayed.   Cardiac Enzymes: No results for input(s): CKTOTAL, CKMB, CKMBINDEX, TROPONINI in the last 168 hours. BNP: Invalid input(s): POCBNP CBG: Recent Labs  Lab 02/19/19 0745 02/19/19 1128 02/19/19 1654 02/19/19 2042 02/20/19 0736  GLUCAP 151* 259* 276* 163* 132*   D-Dimer Recent Labs    02/19/19 0720 02/20/19 0230  DDIMER 0.53* 0.65*   Hgb A1c No results for input(s): HGBA1C in the last 72 hours. Lipid Profile No results for input(s): CHOL, HDL, LDLCALC, TRIG, CHOLHDL, LDLDIRECT in the last 72 hours. Thyroid function studies No results for input(s): TSH, T4TOTAL, T3FREE, THYROIDAB in the last 72 hours.  Invalid input(s): FREET3 Anemia work up Recent  Labs    02/19/19 0720 02/20/19 0230  FERRITIN 247 510*   Urinalysis    Component Value Date/Time   COLORURINE BROWN (A) 04/08/2018 0112   APPEARANCEUR CLOUDY (A) 04/08/2018 0112   LABSPEC 1.019 04/08/2018 0112   PHURINE 5.0 04/08/2018 0112   GLUCOSEU  NEGATIVE 04/08/2018 0112   HGBUR LARGE (A) 04/08/2018 0112   BILIRUBINUR NEGATIVE 04/08/2018 0112   KETONESUR NEGATIVE 04/08/2018 0112   PROTEINUR 100 (A) 04/08/2018 0112   UROBILINOGEN 0.2 12/02/2012 1151   NITRITE NEGATIVE 04/08/2018 0112   LEUKOCYTESUR NEGATIVE 04/08/2018 0112   Sepsis Labs Invalid input(s): PROCALCITONIN,  WBC,  LACTICIDVEN Microbiology No results found for this or any previous visit (from the past 240 hour(s)).   Time coordinating discharge: Over 30 minutes  SIGNED:   Little Ishikawa, DO [name] 02/20/2019, 11:31 AM Pager   If 7PM-7AM, please contact night-coverage [email or web address] Password [email or web address]

## 2019-02-20 NOTE — Progress Notes (Signed)
Physical Therapy Treatment Patient Details Name: Mitchell Murray MRN: [id number] DOB: [birth year] Today's Date: 02/20/2019    History of Present Illness HPI: SUMPTER KICE is a 78 y.o. male with medical history significant of CAD, history of MI, history of atrial fibrillation, history of AICD/pacemaker placement, chronic systolic heart failure, hypertension, hyperlipidemia, type 2 diabetes, hypothyroidism, cholelithiasis, prostate cancer who is referred from around the hospital emergency department after presenting there with significant weakness and body aches 1 day after being discharged from [facility] due to SBO, covid positive but no symptoms.    PT Comments    Patient reports feeling well. Ready to go home.   Follow Up Recommendations  Home health PT;Supervision - Intermittent     Equipment Recommendations  None recommended by PT    Recommendations for Other Services       Precautions / Restrictions Precautions Precautions: Fall    Mobility  Bed Mobility Overal bed mobility: Independent                Transfers Overall transfer level: Independent                  Ambulation/Gait Ambulation/Gait assistance: Supervision Gait Distance (Feet): 400 Feet Assistive device: None Gait Pattern/deviations: Step-through pattern;Drifts right/left Gait velocity: decr   General Gait Details: steady gait once  stnading and moving   Stairs             Wheelchair Mobility    Modified Rankin (Stroke Patients Only)       Balance                                            Cognition Arousal/Alertness: Awake/alert                                            Exercises      General Comments        Pertinent Vitals/Pain Pain Assessment: No/denies pain    Home Living                      Prior Function            PT Goals (current goals can now be found in the care plan section) Progress towards  PT goals: Progressing toward goals    Frequency    Min 3X/week      PT Plan Current plan remains appropriate    Co-evaluation              AM-PAC PT "6 Clicks" Mobility   Outcome Measure  Help needed turning from your back to your side while in a flat bed without using bedrails?: None Help needed moving from lying on your back to sitting on the side of a flat bed without using bedrails?: None Help needed moving to and from a bed to a chair (including a wheelchair)?: None Help needed standing up from a chair using your arms (e.g., wheelchair or bedside chair)?: None Help needed to walk in hospital room?: A Little Help needed climbing 3-5 steps with a railing? : A Little 6 Click Score: 22    End of Session   Activity Tolerance: Patient tolerated treatment well Patient left: in chair;with call bell/phone within reach Nurse Communication: Mobility  status PT Visit Diagnosis: Difficulty in walking, not elsewhere classified (R26.2)     Time: 1200-1230 PT Time Calculation (min) (ACUTE ONLY): 30 min  Charges:  $Gait Training: 23-37 mins                      [place] Pager [phone] Office [phone]    Claretha Cooper 02/20/2019, 4:00 PM

## 2019-02-20 NOTE — Progress Notes (Addendum)
[place] for Warfarin Indication: atrial fibrillation  Allergies  Allergen Reactions  . Ivp Dye [Iodinated Diagnostic Agents] Anaphylaxis  . Metrizamide Anaphylaxis    Patient Measurements: Height: 5\' 10"  (177.8 cm) Weight: 136 lb 0.4 oz (61.7 kg) IBW/kg (Calculated) : 73  Vital Signs: Temp: 98.7 F (37.1 C) (10/22 0820) Temp Source: Oral (10/22 0820) BP: 140/75 (10/22 0820) Pulse Rate: 76 (10/22 0820)  Labs: Recent Labs    02/19/19 0720 02/20/19 0230  HGB 11.3* 12.7*  HCT 33.5* 38.1*  PLT 277 360  LABPROT 31.0* 22.2*  INR 3.0* 2.0*  CREATININE 1.16 1.11    Estimated Creatinine Clearance: 47.9 mL/min (by C-G formula based on SCr of 1.11 mg/dL).  Medications:  Scheduled:  . dexamethasone  6 mg Oral Daily  . digoxin  0.125 mg Oral Daily  . levothyroxine  50 mcg Oral QAC breakfast  . metFORMIN  1,000 mg Oral BID WC  . metoprolol succinate  75 mg Oral Daily  . omega-3 acid ethyl esters  1 g Oral Daily  . pantoprazole  40 mg Oral Daily  . primidone  50 mg Oral QHS  . spironolactone  25 mg Oral Daily  . vitamin C  500 mg Oral Daily  . Warfarin - Pharmacist Dosing Inpatient   Does not apply q1800  . zinc sulfate  220 mg Oral Daily   Infusions:    Assessment: 78 yo admitted from [facility] ED on 10/21 with COVID-19 pneumonia.  PMH includes warfarin PTA for Afib.  Recently admitted to [facility] with SBO - resolved as of 10/19.  Pharmacy consulted to resume warfarin dosing. Admission INR per MD was 2.5 at [facility] ED PTA Warfarin dose:  4mg  daily except 2mg  on Tuesday/Thursday.  Last dose unknown.  Today, 02/20/2019: INR decreased to 2, after holding x1 dose. Hgb and Plt remain stable.  No s/s bleeding reported. Diet: heart healthy, carb modified.  Recently resolved SBO, meal intake 25-50% charted . DDI:  dexamethasone   Goal of Therapy:  INR 2-3 Monitor platelets by anticoagulation protocol: Yes   Plan:  Warfarin 4mg  PO  once today at 18:00 (if not discharged prior to 18:00) Daily PT/INR. Monitor for signs and symptoms of bleeding.  Addendum: If discharged, recommend resuming home regimen and recheck INR by Wednesday 02/26/19.   Gretta Arab PharmD, BCPS Clinical pharmacist phone 7am- 5pm: [phone] 02/20/2019 10:43 AM

## 2019-02-22 DIAGNOSIS — I252 Old myocardial infarction: Secondary | ICD-10-CM | POA: Diagnosis not present

## 2019-02-22 DIAGNOSIS — E785 Hyperlipidemia, unspecified: Secondary | ICD-10-CM | POA: Diagnosis not present

## 2019-02-22 DIAGNOSIS — I2510 Atherosclerotic heart disease of native coronary artery without angina pectoris: Secondary | ICD-10-CM | POA: Diagnosis not present

## 2019-02-22 DIAGNOSIS — I5022 Chronic systolic (congestive) heart failure: Secondary | ICD-10-CM | POA: Diagnosis not present

## 2019-02-22 DIAGNOSIS — R5383 Other fatigue: Secondary | ICD-10-CM | POA: Diagnosis not present

## 2019-02-22 DIAGNOSIS — E039 Hypothyroidism, unspecified: Secondary | ICD-10-CM | POA: Diagnosis not present

## 2019-02-22 DIAGNOSIS — E119 Type 2 diabetes mellitus without complications: Secondary | ICD-10-CM | POA: Diagnosis not present

## 2019-02-22 DIAGNOSIS — R531 Weakness: Secondary | ICD-10-CM | POA: Diagnosis not present

## 2019-02-22 DIAGNOSIS — K8020 Calculus of gallbladder without cholecystitis without obstruction: Secondary | ICD-10-CM | POA: Diagnosis not present

## 2019-02-22 DIAGNOSIS — U071 COVID-19: Secondary | ICD-10-CM | POA: Diagnosis not present

## 2019-02-22 DIAGNOSIS — I110 Hypertensive heart disease with heart failure: Secondary | ICD-10-CM | POA: Diagnosis not present

## 2019-02-22 DIAGNOSIS — I4891 Unspecified atrial fibrillation: Secondary | ICD-10-CM | POA: Diagnosis not present

## 2019-02-25 DIAGNOSIS — I2510 Atherosclerotic heart disease of native coronary artery without angina pectoris: Secondary | ICD-10-CM | POA: Diagnosis not present

## 2019-02-25 DIAGNOSIS — I4891 Unspecified atrial fibrillation: Secondary | ICD-10-CM | POA: Diagnosis not present

## 2019-02-25 DIAGNOSIS — E118 Type 2 diabetes mellitus with unspecified complications: Secondary | ICD-10-CM | POA: Diagnosis not present

## 2019-02-25 DIAGNOSIS — I10 Essential (primary) hypertension: Secondary | ICD-10-CM | POA: Diagnosis not present

## 2019-02-25 DIAGNOSIS — Z79899 Other long term (current) drug therapy: Secondary | ICD-10-CM | POA: Diagnosis not present

## 2019-02-27 ENCOUNTER — Emergency Department (HOSPITAL_COMMUNITY): Payer: Medicare Other

## 2019-02-27 ENCOUNTER — Other Ambulatory Visit: Payer: Self-pay

## 2019-02-27 ENCOUNTER — Encounter (HOSPITAL_COMMUNITY): Payer: Self-pay | Admitting: Emergency Medicine

## 2019-02-27 ENCOUNTER — Emergency Department (HOSPITAL_COMMUNITY)
Admission: EM | Admit: 2019-02-27 | Discharge: 2019-02-27 | Disposition: A | Discharge status: Home / Self Care | Payer: Medicare Other | Attending: Emergency Medicine | Admitting: Emergency Medicine

## 2019-02-27 DIAGNOSIS — U071 COVID-19: Secondary | ICD-10-CM | POA: Diagnosis not present

## 2019-02-27 DIAGNOSIS — Z7984 Long term (current) use of oral hypoglycemic drugs: Secondary | ICD-10-CM | POA: Diagnosis not present

## 2019-02-27 DIAGNOSIS — I4891 Unspecified atrial fibrillation: Secondary | ICD-10-CM | POA: Insufficient documentation

## 2019-02-27 DIAGNOSIS — E119 Type 2 diabetes mellitus without complications: Secondary | ICD-10-CM | POA: Diagnosis not present

## 2019-02-27 DIAGNOSIS — I517 Cardiomegaly: Secondary | ICD-10-CM | POA: Diagnosis not present

## 2019-02-27 DIAGNOSIS — N390 Urinary tract infection, site not specified: Secondary | ICD-10-CM | POA: Insufficient documentation

## 2019-02-27 DIAGNOSIS — N23 Unspecified renal colic: Secondary | ICD-10-CM

## 2019-02-27 DIAGNOSIS — Z950 Presence of cardiac pacemaker: Secondary | ICD-10-CM | POA: Insufficient documentation

## 2019-02-27 DIAGNOSIS — I252 Old myocardial infarction: Secondary | ICD-10-CM | POA: Insufficient documentation

## 2019-02-27 DIAGNOSIS — I110 Hypertensive heart disease with heart failure: Secondary | ICD-10-CM | POA: Diagnosis not present

## 2019-02-27 DIAGNOSIS — Z7901 Long term (current) use of anticoagulants: Secondary | ICD-10-CM | POA: Diagnosis not present

## 2019-02-27 DIAGNOSIS — I5022 Chronic systolic (congestive) heart failure: Secondary | ICD-10-CM | POA: Insufficient documentation

## 2019-02-27 DIAGNOSIS — E039 Hypothyroidism, unspecified: Secondary | ICD-10-CM | POA: Diagnosis not present

## 2019-02-27 DIAGNOSIS — I2510 Atherosclerotic heart disease of native coronary artery without angina pectoris: Secondary | ICD-10-CM | POA: Insufficient documentation

## 2019-02-27 DIAGNOSIS — R1032 Left lower quadrant pain: Secondary | ICD-10-CM | POA: Diagnosis present

## 2019-02-27 DIAGNOSIS — Z79899 Other long term (current) drug therapy: Secondary | ICD-10-CM | POA: Insufficient documentation

## 2019-02-27 DIAGNOSIS — R109 Unspecified abdominal pain: Secondary | ICD-10-CM | POA: Diagnosis not present

## 2019-02-27 DIAGNOSIS — Z7982 Long term (current) use of aspirin: Secondary | ICD-10-CM | POA: Insufficient documentation

## 2019-02-27 LAB — URINALYSIS, ROUTINE W REFLEX MICROSCOPIC
RBC / HPF: >50 RBC/hpf — ABNORMAL HIGH (ref 0–5)
WBC, UA: >50 WBC/hpf — ABNORMAL HIGH (ref 0–5)

## 2019-02-27 LAB — COMPREHENSIVE METABOLIC PANEL
ALT: 32 U/L (ref 0–44)
AST: 21 U/L (ref 15–41)
Albumin: 4 g/dL (ref 3.5–5.0)
Alkaline Phosphatase: 45 U/L (ref 38–126)
Anion gap: 13 (ref 5–15)
BUN: 33 mg/dL — ABNORMAL HIGH (ref 8–23)
CO2: 22 mmol/L (ref 22–32)
Calcium: 9.9 mg/dL (ref 8.9–10.3)
Chloride: 105 mmol/L (ref 98–111)
Creatinine, Ser: 1.04 mg/dL (ref 0.61–1.24)
GFR calc Af Amer: >60 mL/min (ref >60)
GFR calc non Af Amer: >60 mL/min (ref >60)
Glucose, Bld: 128 mg/dL — ABNORMAL HIGH (ref 70–99)
Potassium: 3.9 mmol/L (ref 3.5–5.1)
Sodium: 140 mmol/L (ref 135–145)
Total Bilirubin: 1.2 mg/dL (ref 0.3–1.2)
Total Protein: 7.9 g/dL (ref 6.5–8.1)

## 2019-02-27 LAB — LACTIC ACID, PLASMA
Lactic Acid, Venous: 2.5 mmol/L (ref 0.5–1.9)
Lactic Acid, Venous: 2.3 mmol/L (ref 0.5–1.9)

## 2019-02-27 LAB — CBC WITH DIFFERENTIAL/PLATELET
Abs Immature Granulocytes: 0.07 10*3/uL (ref 0.00–0.07)
Basophils Absolute: 0 10*3/uL (ref 0.0–0.1)
Basophils Relative: 0 %
Eosinophils Absolute: 0 10*3/uL (ref 0.0–0.5)
Eosinophils Relative: 0 %
HCT: 39.2 % (ref 39.0–52.0)
Hemoglobin: 12.6 g/dL — ABNORMAL LOW (ref 13.0–17.0)
Immature Granulocytes: 1 %
Lymphocytes Relative: 16 %
Lymphs Abs: 1.9 10*3/uL (ref 0.7–4.0)
MCH: 29.9 pg (ref 26.0–34.0)
MCHC: 32.1 g/dL (ref 30.0–36.0)
MCV: 92.9 fL (ref 80.0–100.0)
Monocytes Absolute: 1.3 10*3/uL — ABNORMAL HIGH (ref 0.1–1.0)
Monocytes Relative: 11 %
Neutro Abs: 8.8 10*3/uL — ABNORMAL HIGH (ref 1.7–7.7)
Neutrophils Relative %: 72 %
Platelets: 485 10*3/uL — ABNORMAL HIGH (ref 150–400)
RBC: 4.22 MIL/uL (ref 4.22–5.81)
RDW: 13.5 % (ref 11.5–15.5)
WBC: 12 10*3/uL — ABNORMAL HIGH (ref 4.0–10.5)
nRBC: 0 % (ref 0.0–0.2)

## 2019-02-27 LAB — FIBRINOGEN: Fibrinogen: 483 mg/dL — ABNORMAL HIGH (ref 210–475)

## 2019-02-27 LAB — TRIGLYCERIDES: Triglycerides: 178 mg/dL — ABNORMAL HIGH (ref <150)

## 2019-02-27 LAB — D-DIMER, QUANTITATIVE: D-Dimer, Quant: 0.45 ug/mL-FEU (ref 0.00–0.50)

## 2019-02-27 LAB — SARS CORONAVIRUS 2 BY RT PCR (HOSPITAL ORDER, PERFORMED IN ~~LOC~~ HOSPITAL LAB): SARS Coronavirus 2: POSITIVE — AB

## 2019-02-27 LAB — PROCALCITONIN: Procalcitonin: <0.1 ng/mL

## 2019-02-27 LAB — C-REACTIVE PROTEIN: CRP: <0.8 mg/dL (ref <1.0)

## 2019-02-27 LAB — FERRITIN: Ferritin: 335 ng/mL (ref 24–336)

## 2019-02-27 LAB — LACTATE DEHYDROGENASE: LDH: 184 U/L (ref 98–192)

## 2019-02-27 MED ORDER — SODIUM CHLORIDE 0.9 % IV SOLN
1.0000 g | Freq: Once | INTRAVENOUS | Status: AC
  Administered 2019-02-27: 1 g via INTRAVENOUS
  Filled 2019-02-27: qty 10

## 2019-02-27 MED ORDER — KETOROLAC TROMETHAMINE 30 MG/ML IJ SOLN: 15.0000 mg | Freq: Once | INTRAMUSCULAR | Status: DC

## 2019-02-27 MED ORDER — TAMSULOSIN HCL 0.4 MG PO CAPS: 0.4000 mg | ORAL_CAPSULE | Freq: Every day | ORAL | 0 refills | Status: AC

## 2019-02-27 MED ORDER — SODIUM CHLORIDE 0.9 % IV BOLUS
1000.0000 mL | Freq: Once | INTRAVENOUS | Status: AC
  Administered 2019-02-27: 10:00:00 1000 mL via INTRAVENOUS

## 2019-02-27 MED ORDER — MORPHINE SULFATE (PF) 4 MG/ML IV SOLN
4.0000 mg | Freq: Once | INTRAVENOUS | Status: AC
  Administered 2019-02-27: 4 mg via INTRAVENOUS
  Filled 2019-02-27: qty 1

## 2019-02-27 MED ORDER — CEPHALEXIN 500 MG PO CAPS: 500.0000 mg | ORAL_CAPSULE | Freq: Three times a day (TID) | ORAL | 0 refills | Status: DC

## 2019-02-27 MED ORDER — SODIUM CHLORIDE 0.9 % IV BOLUS
1000.0000 mL | Freq: Once | INTRAVENOUS | Status: AC
  Administered 2019-02-27: 1000 mL via INTRAVENOUS

## 2019-02-27 MED ORDER — ONDANSETRON HCL 4 MG/2ML IJ SOLN
4.0000 mg | Freq: Once | INTRAMUSCULAR | Status: AC
  Administered 2019-02-27: 10:00:00 4 mg via INTRAVENOUS
  Filled 2019-02-27: qty 2

## 2019-02-27 MED ORDER — HYDROCODONE-ACETAMINOPHEN 5-325 MG PO TABS: 1.0000 | ORAL_TABLET | ORAL | 0 refills | Status: AC | PRN

## 2019-02-27 MED ORDER — HYDROMORPHONE HCL 1 MG/ML IJ SOLN
1.0000 mg | Freq: Once | INTRAMUSCULAR | Status: AC
  Administered 2019-02-27: 1 mg via INTRAVENOUS
  Filled 2019-02-27: qty 1

## 2019-02-27 NOTE — Discharge Instructions (Addendum)
You still have COVID virus in your system. Stay home   Take motrin for pain   Take vicodin for severe pain   Take flomax daily   See urologist in a week for follow up   Return to ER if you have worse flank pain, vomiting, fever, trouble breathing.

## 2019-02-27 NOTE — ED Triage Notes (Signed)
Pt c/o left flank pains that started this morning. Reports hx kidney stone.

## 2019-02-27 NOTE — ED Provider Notes (Addendum)
[place] DEPT Provider Note   CSN: Mitchell Murray:[id number] Arrival date & time: 02/27/19  [id number]     History   Chief Complaint Chief Complaint  Patient presents with  . Flank Pain    HPI ERA Mitchell Murray is a 78 y.o. male hx of GERD, HL, HTN, MI, kidney stones, recent Covid infection here presenting with left flank pain.  Patient has acute onset of left flank pain started this morning.  States that its sharp and radiates to the left groin.  He states that similar to his previous kidney stones.  He states that he saw Dr. Jeffie Pollock previously for follow-up for his kidney stone.  He was recently in the hospital and discharged from the hospital about a week ago after Covid infection.  He states that he is not on any medicines currently for Covid and has no cough or shortness of breath.     The history is provided by the patient.    Past Medical History:  Diagnosis Date  . Automatic implantable cardioverter-defibrillator in situ   . Cardiomegaly   . CHF (congestive heart failure) ([place])   . Coronary artery disease   . Dysrhythmia    atrial fib  . GERD (gastroesophageal reflux disease)   . Hard of hearing   . Hyperlipidemia   . Hypertension   . Hypothyroidism   . Kidney stones   . MI (myocardial infarction) ([place])   . Pacemaker   . Prostate cancer [facility])     Patient Active Problem List   Diagnosis Date Noted  . 2019 novel coronavirus disease (COVID-19) 02/19/2019  . SBO (small bowel obstruction) ([place]) 02/14/2019  . Chronic systolic congestive heart failure ([place])   . Type 2 diabetes mellitus ([place]) 06/14/2014  . Orthostatic hypotension   . Weakness generalized 06/13/2014  . Elevated troponin 06/12/2014  . Essential hypertension 06/12/2014  . HLD (hyperlipidemia) 06/12/2014  . GERD (gastroesophageal reflux disease) 06/12/2014  . Hypothyroidism 12/03/2012  . Hypokalemia 12/03/2012  . Hyperglycemia 12/03/2012  . Right flank pain 12/02/2012  . CAD (coronary  artery disease) 12/02/2012  . AICD (automatic cardioverter/defibrillator) present 12/02/2012  . A-fib ([place]) 12/02/2012  . Prostate cancer ([place]) 12/02/2012  . Pyelonephritis 12/02/2012    Past Surgical History:  Procedure Laterality Date  . CARDIAC DEFIBRILLATOR PLACEMENT    . CYSTOSCOPY WITH LITHOLAPAXY N/A 01/24/2013   Procedure: CYSTOSCOPY WITH LITHOLAPAXY;  Surgeon: Ardis Hughs, MD;  Location: [facility] ORS;  Service: Urology;  Laterality: N/A;  . HERNIA REPAIR    . HOLMIUM LASER APPLICATION N/A [id number]   Procedure: HOLMIUM LASER APPLICATION;  Surgeon: Ardis Hughs, MD;  Location: [facility] ORS;  Service: Urology;  Laterality: N/A;  . PACEMAKER INSERTION    . PROSTATE SURGERY    . PROSTATECTOMY          Home Medications    Prior to Admission medications   Medication Sig Start Date End Date Taking? Authorizing Provider  aspirin 81 MG EC tablet Take 81 mg by mouth once a week.   Yes [provider]  atorvastatin (LIPITOR) 20 MG tablet Take 20 mg by mouth every evening.    Yes [provider]  dexamethasone (DECADRON) 2 MG tablet Take 6 mg by mouth daily. 02/25/19  Yes [provider]  digoxin (LANOXIN) 0.125 MG tablet Take 0.125 mg by mouth daily.   Yes [provider]  gabapentin (NEURONTIN) 100 MG capsule Take 100 mg by mouth 2 (two) times daily.   Yes [provider]  levothyroxine (SYNTHROID, LEVOTHROID) 50 MCG tablet Take 50 mcg by mouth daily before breakfast.   Yes [provider]  loperamide (IMODIUM) 2 MG capsule Take 2 mg by mouth 4 (four) times daily as needed. 02/25/19  Yes [provider]  meclizine (ANTIVERT) 25 MG tablet Take 12.5 mg by mouth 3 (three) times daily as needed for dizziness.    Yes [provider]  metFORMIN (GLUCOPHAGE) 1000 MG tablet Take 1,000 mg by mouth 2 (two) times daily.   Yes [provider]  metoprolol succinate (TOPROL-XL) 50 MG 24 hr tablet Take 75 mg by mouth  daily. Take with or immediately following a meal.    Yes [provider]  Omega-3 1000 MG CAPS Take 1,000 mg by mouth daily.    Yes [provider]  omeprazole (PRILOSEC) 40 MG capsule Take 40 mg by mouth daily as needed (Heart Burn).    Yes [provider]  spironolactone (ALDACTONE) 25 MG tablet Take 25 mg by mouth daily. 03/26/17  Yes [provider]  warfarin (COUMADIN) 4 MG tablet Take 2-4 mg by mouth daily. 2mg  on Tuesday and Thursday; Wednesday,Friday,Monday,Saturdya & Sunday 4mg    Yes [provider]  cephALEXin (KEFLEX) 500 MG capsule Take 1 capsule (500 mg total) by mouth 3 (three) times daily. 02/27/19   Drenda Freeze, MD  HYDROcodone-acetaminophen (NORCO/VICODIN) 5-325 MG tablet Take 1 tablet by mouth every 4 (four) hours as needed. 02/27/19   Drenda Freeze, MD  tamsulosin (FLOMAX) 0.4 MG CAPS capsule Take 1 capsule (0.4 mg total) by mouth daily. 02/27/19   Drenda Freeze, MD    Family History Family History  Problem Relation Age of Onset  . Heart failure Mother   . Cancer Father   . Heart failure Sister     Social History Social History   Tobacco Use  . Smoking status: Never Smoker  . Smokeless tobacco: Never Used  Substance Use Topics  . Alcohol use: No  . Drug use: No     Allergies   Ivp dye [iodinated diagnostic agents] and Metrizamide   Review of Systems Review of Systems  Genitourinary: Positive for flank pain.  All other systems reviewed and are negative.    Physical Exam Updated Vital Signs BP 134/83   Pulse 75   Temp 98 F (36.7 C)   Resp 14   SpO2 92%   Physical Exam Vitals signs and nursing note reviewed.  HENT:     Head: Normocephalic.     Right Ear: Tympanic membrane normal.     Left Ear: Tympanic membrane normal.     Nose: Nose normal.     Mouth/Throat:     Mouth: Mucous membranes are moist.  Eyes:     Extraocular Movements: Extraocular movements intact.     Pupils: Pupils  are equal, round, and reactive to light.  Neck:     Musculoskeletal: Normal range of motion.  Cardiovascular:     Rate and Rhythm: Normal rate and regular rhythm.     Pulses: Normal pulses.  Pulmonary:     Effort: Pulmonary effort is normal.     Breath sounds: Normal breath sounds.  Abdominal:     General: Abdomen is flat.     Comments: + L CVAT   Musculoskeletal: Normal range of motion.  Skin:    General: Skin is warm.     Capillary Refill: Capillary refill takes less than 2 seconds.  Neurological:  General: No focal deficit present.     Mental Status: He is alert and oriented to person, place, and time.  Psychiatric:        Mood and Affect: Mood normal.        Behavior: Behavior normal.      ED Treatments / Results  Labs (all labs ordered are listed, but only abnormal results are displayed) Labs Reviewed  SARS CORONAVIRUS 2 BY RT PCR (HOSPITAL ORDER, [facility]) - Abnormal; Notable for the following components:      Result Value   SARS Coronavirus 2 POSITIVE (*)    All other components within normal limits  LACTIC ACID, PLASMA - Abnormal; Notable for the following components:   Lactic Acid, Venous 2.5 (*)    All other components within normal limits  LACTIC ACID, PLASMA - Abnormal; Notable for the following components:   Lactic Acid, Venous 2.3 (*)    All other components within normal limits  CBC WITH DIFFERENTIAL/PLATELET - Abnormal; Notable for the following components:   WBC 12.0 (*)    Hemoglobin 12.6 (*)    Platelets 485 (*)    Neutro Abs 8.8 (*)    Monocytes Absolute 1.3 (*)    All other components within normal limits  COMPREHENSIVE METABOLIC PANEL - Abnormal; Notable for the following components:   Glucose, Bld 128 (*)    BUN 33 (*)    All other components within normal limits  TRIGLYCERIDES - Abnormal; Notable for the following components:   Triglycerides 178 (*)    All other components within normal limits  FIBRINOGEN -  Abnormal; Notable for the following components:   Fibrinogen 483 (*)    All other components within normal limits  URINALYSIS, ROUTINE W REFLEX MICROSCOPIC - Abnormal; Notable for the following components:   Color, Urine RED (*)    APPearance HAZY (*)    Glucose, UA   (*)    Value: TEST NOT REPORTED DUE TO COLOR INTERFERENCE OF URINE PIGMENT   Hgb urine dipstick   (*)    Value: TEST NOT REPORTED DUE TO COLOR INTERFERENCE OF URINE PIGMENT   Bilirubin Urine   (*)    Value: TEST NOT REPORTED DUE TO COLOR INTERFERENCE OF URINE PIGMENT   Ketones, ur   (*)    Value: TEST NOT REPORTED DUE TO COLOR INTERFERENCE OF URINE PIGMENT   Protein, ur   (*)    Value: TEST NOT REPORTED DUE TO COLOR INTERFERENCE OF URINE PIGMENT   Nitrite   (*)    Value: TEST NOT REPORTED DUE TO COLOR INTERFERENCE OF URINE PIGMENT   Leukocytes,Ua   (*)    Value: TEST NOT REPORTED DUE TO COLOR INTERFERENCE OF URINE PIGMENT   RBC / HPF >50 (*)    WBC, UA >50 (*)    Bacteria, UA MANY (*)    All other components within normal limits  CULTURE, BLOOD (ROUTINE X 2)  CULTURE, BLOOD (ROUTINE X 2)  URINE CULTURE  D-DIMER, QUANTITATIVE (NOT AT [facility])  PROCALCITONIN  LACTATE DEHYDROGENASE  FERRITIN  C-REACTIVE PROTEIN    EKG None  Radiology Dg Chest Port 1 View  Result Date: 02/27/2019 CLINICAL DATA:  Recent COVID-19 positive.  Flank region pain EXAM: PORTABLE CHEST 1 VIEW COMPARISON:  February 19, 2019 FINDINGS: There is no edema or consolidation. Heart mildly enlarged with pulmonary vascularity normal. Pacemaker leads are attached to the right ventricle and coronary sinus. No pneumothorax. There is aortic atherosclerosis. No adenopathy.  No bone lesions. IMPRESSION: Mild cardiomegaly. Stable pacemaker lead placement. No edema or consolidation. Aortic Atherosclerosis (ICD10-I70.0). Electronically Signed   By: Lowella Grip III M.D.   On: 02/27/2019 11:02   Ct Renal Stone Study  Result Date: 02/27/2019 CLINICAL DATA:   Left flank pain EXAM: CT ABDOMEN AND PELVIS WITHOUT CONTRAST TECHNIQUE: Multidetector CT imaging of the abdomen and pelvis was performed following the standard protocol without IV contrast. COMPARISON:  CT abdomen pelvis 02/13/2019 FINDINGS: Lower chest: Patchy airspace disease left lower lobe posteriorly. This was not present previously and likely is pneumonia. Right lung clear. No effusion. Pacemaker noted. Hepatobiliary: No focal liver abnormality is seen. No gallstones, gallbladder wall thickening, or biliary dilatation. Pancreas: Negative Spleen: Negative Adrenals/Urinary Tract: Mild left hydronephrosis. 3 mm stone mid left ureter at the L4 level. No obstruction on the prior study. Small bilateral renal calculi. No obstruction on the right. Normal bladder. Stomach/Bowel: Negative for bowel obstruction. Negative for bowel mass or edema. Normal appendix. Vascular/Lymphatic: Atherosclerotic calcification. Negative for aortic aneurysm. No lymphadenopathy Reproductive: Prostatectomy for prostate cancer.  No pelvic mass. Other: No free fluid Musculoskeletal: No acute skeletal abnormality. IMPRESSION: 1. 3 mm obstructing stone mid left ureter at the L4 level. No obstruction on the recent CT of 02/13/2019. Small bilateral renal calculi. 2. Prostatectomy for prostate cancer.  No adenopathy. 3. Left lower lobe infiltrate, probable pneumonia Electronically Signed   By: Franchot Gallo M.D.   On: 02/27/2019 13:38    Procedures Procedures (including critical care time)  Medications Ordered in ED Medications  cefTRIAXone (ROCEPHIN) 1 g in sodium chloride 0.9 % 100 mL IVPB (has no administration in time range)  sodium chloride 0.9 % bolus 1,000 mL (has no administration in time range)  sodium chloride 0.9 % bolus 1,000 mL (1,000 mLs Intravenous Bolus 02/27/19 1029)  morphine 4 MG/ML injection 4 mg (4 mg Intravenous Given 02/27/19 1029)  ondansetron (ZOFRAN) injection 4 mg (4 mg Intravenous Given 02/27/19 1029)   HYDROmorphone (DILAUDID) injection 1 mg (1 mg Intravenous Given 02/27/19 1056)     Initial Impression / Assessment and Plan / ED Course  I have reviewed the triage vital signs and the nursing notes.  Pertinent labs & imaging results that were available during my care of the patient were reviewed by me and considered in my medical decision making (see chart for details).        GUNTER GOODNER is a 78 y.o. male here with L flank pain. Hx of renal colic, likely recurrent stone. Also recently had COVID so will get [place] preadmission labs. Will get CT renal stone, UA. Will give pain meds.   3:20 PM His Cr is normal. WBC is 12. Pain controlled with pain meds. UA showed possible UTI. Inflammatory markers improved and still COVID positive. Given rocephin for UTI, urine culture sent. Discussed with Dr. Junious Silk from urology. Even though his lactate is mildly elevated but he is afebrile and I don't think he is septic. Will dc home with keflex, flomax, pain meds. Will have him follow up with urology in a week.     Final Clinical Impressions(s) / ED Diagnoses   Final diagnoses:  Renal colic on left side  Lower urinary tract infectious disease  COVID-19 virus infection    ED Discharge Orders         Ordered    cephALEXin (KEFLEX) 500 MG capsule  3 times daily     02/27/19 1507    HYDROcodone-acetaminophen (NORCO/VICODIN) 5-325 MG tablet  Every 4 hours PRN     02/27/19 1507    tamsulosin (FLOMAX) 0.4 MG CAPS capsule  Daily     02/27/19 1507           Drenda Freeze, MD 02/27/19 1503    Drenda Freeze, MD 02/27/19 1520

## 2019-02-27 NOTE — ED Notes (Signed)
Call lab to add on urine culture 

## 2019-02-27 NOTE — ED Notes (Addendum)
CRITICAL VALUE ALERT  Critical Value:  Lactic 2.5 mmol/L  Date & Time Notied:  02/27/19 1132  Provider Notified: Darl Householder MD  Orders Received/Actions taken: Provider aware

## 2019-02-27 NOTE — ED Notes (Signed)
Pt to be discharged after IVF and abx

## 2019-03-01 LAB — URINE CULTURE: Culture: >=100000 — AB

## 2019-03-02 ENCOUNTER — Telehealth: Payer: Self-pay | Admitting: *Deleted

## 2019-03-02 NOTE — Telephone Encounter (Signed)
Post ED Visit - Positive Culture Follow-up  Culture report reviewed by antimicrobial stewardship pharmacist: [place] Team []  Elenor Quinones, Pharm.D. []  Heide Guile, Pharm.D., BCPS AQ-ID []  Parks Neptune, Pharm.D., BCPS []  Alycia Rossetti, Pharm.D., BCPS []  [place], Pharm.D., BCPS, AAHIVP []  Legrand Como, Pharm.D., BCPS, AAHIVP []  Salome Arnt, PharmD, BCPS []  Johnnette Gourd, PharmD, BCPS []  Hughes Better, PharmD, BCPS []  Leeroy Cha, PharmD []  Laqueta Linden, PharmD, BCPS []  Albertina Parr, PharmD  [place] Team []  Leodis Sias, PharmD []  Lindell Spar, PharmD []  Royetta Asal, PharmD []  Graylin Shiver, Rph []  Rema Fendt) Glennon Mac, PharmD []  Arlyn Dunning, PharmD []  Netta Cedars, PharmD []  Dia Sitter, PharmD []  Leone Haven, PharmD []  Gretta Arab, PharmD []  Theodis Shove, PharmD []  Peggyann Juba, PharmD [x]  Reuel Boom, PharmD   Positive [name] culture Treated with Cephalexin, organism sensitive to the same and no further patient follow-up is required at this time.  Harlon Flor [facility] 03/02/2019, 2:17 PM

## 2019-03-04 DIAGNOSIS — E063 Autoimmune thyroiditis: Secondary | ICD-10-CM | POA: Diagnosis not present

## 2019-03-04 DIAGNOSIS — R791 Abnormal coagulation profile: Secondary | ICD-10-CM | POA: Diagnosis not present

## 2019-03-04 DIAGNOSIS — K219 Gastro-esophageal reflux disease without esophagitis: Secondary | ICD-10-CM | POA: Diagnosis not present

## 2019-03-04 DIAGNOSIS — I4891 Unspecified atrial fibrillation: Secondary | ICD-10-CM | POA: Diagnosis not present

## 2019-03-04 DIAGNOSIS — N200 Calculus of kidney: Secondary | ICD-10-CM | POA: Diagnosis not present

## 2019-03-04 LAB — CULTURE, BLOOD (ROUTINE X 2)
Culture: NO GROWTH
Culture: NO GROWTH
Special Requests: ADEQUATE
Special Requests: ADEQUATE

## 2019-03-05 DIAGNOSIS — E785 Hyperlipidemia, unspecified: Secondary | ICD-10-CM | POA: Diagnosis not present

## 2019-03-05 DIAGNOSIS — I110 Hypertensive heart disease with heart failure: Secondary | ICD-10-CM | POA: Diagnosis not present

## 2019-03-05 DIAGNOSIS — I2510 Atherosclerotic heart disease of native coronary artery without angina pectoris: Secondary | ICD-10-CM | POA: Diagnosis not present

## 2019-03-05 DIAGNOSIS — E119 Type 2 diabetes mellitus without complications: Secondary | ICD-10-CM | POA: Diagnosis not present

## 2019-03-05 DIAGNOSIS — R5383 Other fatigue: Secondary | ICD-10-CM | POA: Diagnosis not present

## 2019-03-05 DIAGNOSIS — K8020 Calculus of gallbladder without cholecystitis without obstruction: Secondary | ICD-10-CM | POA: Diagnosis not present

## 2019-03-05 DIAGNOSIS — I252 Old myocardial infarction: Secondary | ICD-10-CM | POA: Diagnosis not present

## 2019-03-05 DIAGNOSIS — R531 Weakness: Secondary | ICD-10-CM | POA: Diagnosis not present

## 2019-03-05 DIAGNOSIS — I4891 Unspecified atrial fibrillation: Secondary | ICD-10-CM | POA: Diagnosis not present

## 2019-03-05 DIAGNOSIS — E039 Hypothyroidism, unspecified: Secondary | ICD-10-CM | POA: Diagnosis not present

## 2019-03-05 DIAGNOSIS — I5022 Chronic systolic (congestive) heart failure: Secondary | ICD-10-CM | POA: Diagnosis not present

## 2019-03-05 DIAGNOSIS — U071 COVID-19: Secondary | ICD-10-CM | POA: Diagnosis not present

## 2019-03-06 DIAGNOSIS — I4891 Unspecified atrial fibrillation: Secondary | ICD-10-CM | POA: Diagnosis not present

## 2019-03-06 DIAGNOSIS — R791 Abnormal coagulation profile: Secondary | ICD-10-CM | POA: Diagnosis not present

## 2019-03-11 DIAGNOSIS — L578 Other skin changes due to chronic exposure to nonionizing radiation: Secondary | ICD-10-CM | POA: Diagnosis not present

## 2019-03-11 DIAGNOSIS — L570 Actinic keratosis: Secondary | ICD-10-CM | POA: Diagnosis not present

## 2019-03-11 DIAGNOSIS — L821 Other seborrheic keratosis: Secondary | ICD-10-CM | POA: Diagnosis not present

## 2019-03-16 DIAGNOSIS — I6389 Other cerebral infarction: Secondary | ICD-10-CM | POA: Diagnosis not present

## 2019-03-16 DIAGNOSIS — Z950 Presence of cardiac pacemaker: Secondary | ICD-10-CM | POA: Diagnosis not present

## 2019-03-16 DIAGNOSIS — R29810 Facial weakness: Secondary | ICD-10-CM | POA: Diagnosis not present

## 2019-03-16 DIAGNOSIS — I639 Cerebral infarction, unspecified: Secondary | ICD-10-CM | POA: Diagnosis not present

## 2019-03-16 DIAGNOSIS — I6350 Cerebral infarction due to unspecified occlusion or stenosis of unspecified cerebral artery: Secondary | ICD-10-CM | POA: Diagnosis not present

## 2019-03-16 DIAGNOSIS — I214 Non-ST elevation (NSTEMI) myocardial infarction: Secondary | ICD-10-CM | POA: Diagnosis not present

## 2019-03-16 DIAGNOSIS — S0990XA Unspecified injury of head, initial encounter: Secondary | ICD-10-CM | POA: Diagnosis not present

## 2019-03-16 DIAGNOSIS — I959 Hypotension, unspecified: Secondary | ICD-10-CM | POA: Diagnosis not present

## 2019-03-16 DIAGNOSIS — G252 Other specified forms of tremor: Secondary | ICD-10-CM | POA: Diagnosis not present

## 2019-03-16 DIAGNOSIS — E785 Hyperlipidemia, unspecified: Secondary | ICD-10-CM | POA: Diagnosis not present

## 2019-03-16 DIAGNOSIS — Z8673 Personal history of transient ischemic attack (TIA), and cerebral infarction without residual deficits: Secondary | ICD-10-CM | POA: Diagnosis not present

## 2019-03-16 DIAGNOSIS — I2510 Atherosclerotic heart disease of native coronary artery without angina pectoris: Secondary | ICD-10-CM | POA: Diagnosis not present

## 2019-03-16 DIAGNOSIS — I252 Old myocardial infarction: Secondary | ICD-10-CM | POA: Diagnosis not present

## 2019-03-16 DIAGNOSIS — Z743 Need for continuous supervision: Secondary | ICD-10-CM | POA: Diagnosis not present

## 2019-03-16 DIAGNOSIS — Z91041 Radiographic dye allergy status: Secondary | ICD-10-CM | POA: Diagnosis not present

## 2019-03-16 DIAGNOSIS — E43 Unspecified severe protein-calorie malnutrition: Secondary | ICD-10-CM | POA: Diagnosis not present

## 2019-03-16 DIAGNOSIS — Z79899 Other long term (current) drug therapy: Secondary | ICD-10-CM | POA: Diagnosis not present

## 2019-03-16 DIAGNOSIS — I420 Dilated cardiomyopathy: Secondary | ICD-10-CM | POA: Diagnosis not present

## 2019-03-16 DIAGNOSIS — R633 Feeding difficulties: Secondary | ICD-10-CM | POA: Diagnosis not present

## 2019-03-16 DIAGNOSIS — D649 Anemia, unspecified: Secondary | ICD-10-CM | POA: Diagnosis not present

## 2019-03-16 DIAGNOSIS — S199XXA Unspecified injury of neck, initial encounter: Secondary | ICD-10-CM | POA: Diagnosis not present

## 2019-03-16 DIAGNOSIS — I63511 Cerebral infarction due to unspecified occlusion or stenosis of right middle cerebral artery: Secondary | ICD-10-CM | POA: Diagnosis not present

## 2019-03-16 DIAGNOSIS — I4891 Unspecified atrial fibrillation: Secondary | ICD-10-CM | POA: Diagnosis not present

## 2019-03-16 DIAGNOSIS — I480 Paroxysmal atrial fibrillation: Secondary | ICD-10-CM | POA: Diagnosis not present

## 2019-03-16 DIAGNOSIS — E119 Type 2 diabetes mellitus without complications: Secondary | ICD-10-CM | POA: Diagnosis not present

## 2019-03-16 DIAGNOSIS — E7800 Pure hypercholesterolemia, unspecified: Secondary | ICD-10-CM | POA: Diagnosis not present

## 2019-03-16 DIAGNOSIS — J449 Chronic obstructive pulmonary disease, unspecified: Secondary | ICD-10-CM | POA: Diagnosis not present

## 2019-03-16 DIAGNOSIS — Z7901 Long term (current) use of anticoagulants: Secondary | ICD-10-CM | POA: Diagnosis not present

## 2019-03-16 DIAGNOSIS — R4701 Aphasia: Secondary | ICD-10-CM | POA: Diagnosis not present

## 2019-03-16 DIAGNOSIS — G936 Cerebral edema: Secondary | ICD-10-CM | POA: Diagnosis not present

## 2019-03-16 DIAGNOSIS — S299XXA Unspecified injury of thorax, initial encounter: Secondary | ICD-10-CM | POA: Diagnosis not present

## 2019-03-16 DIAGNOSIS — Z95810 Presence of automatic (implantable) cardiac defibrillator: Secondary | ICD-10-CM | POA: Diagnosis not present

## 2019-03-16 DIAGNOSIS — I509 Heart failure, unspecified: Secondary | ICD-10-CM | POA: Diagnosis not present

## 2019-03-16 DIAGNOSIS — R471 Dysarthria and anarthria: Secondary | ICD-10-CM | POA: Diagnosis not present

## 2019-03-16 DIAGNOSIS — Z7984 Long term (current) use of oral hypoglycemic drugs: Secondary | ICD-10-CM | POA: Diagnosis not present

## 2019-03-16 DIAGNOSIS — I110 Hypertensive heart disease with heart failure: Secondary | ICD-10-CM | POA: Diagnosis not present

## 2019-03-16 DIAGNOSIS — G8194 Hemiplegia, unspecified affecting left nondominant side: Secondary | ICD-10-CM | POA: Diagnosis not present

## 2019-03-16 DIAGNOSIS — R4781 Slurred speech: Secondary | ICD-10-CM | POA: Diagnosis not present

## 2019-03-16 DIAGNOSIS — R1312 Dysphagia, oropharyngeal phase: Secondary | ICD-10-CM | POA: Diagnosis not present

## 2019-03-16 DIAGNOSIS — R519 Headache, unspecified: Secondary | ICD-10-CM | POA: Diagnosis not present

## 2019-03-16 DIAGNOSIS — R29706 NIHSS score 6: Secondary | ICD-10-CM | POA: Diagnosis not present

## 2019-03-16 DIAGNOSIS — W19XXXA Unspecified fall, initial encounter: Secondary | ICD-10-CM | POA: Diagnosis not present

## 2019-03-16 DIAGNOSIS — R414 Neurologic neglect syndrome: Secondary | ICD-10-CM | POA: Diagnosis not present

## 2019-03-24 DIAGNOSIS — E861 Hypovolemia: Secondary | ICD-10-CM | POA: Diagnosis not present

## 2019-03-24 DIAGNOSIS — I951 Orthostatic hypotension: Secondary | ICD-10-CM | POA: Diagnosis not present

## 2019-03-24 DIAGNOSIS — S40812D Abrasion of left upper arm, subsequent encounter: Secondary | ICD-10-CM | POA: Diagnosis not present

## 2019-03-24 DIAGNOSIS — I639 Cerebral infarction, unspecified: Secondary | ICD-10-CM | POA: Diagnosis not present

## 2019-03-24 DIAGNOSIS — H9193 Unspecified hearing loss, bilateral: Secondary | ICD-10-CM | POA: Diagnosis not present

## 2019-03-24 DIAGNOSIS — I69320 Aphasia following cerebral infarction: Secondary | ICD-10-CM | POA: Diagnosis not present

## 2019-03-24 DIAGNOSIS — R1312 Dysphagia, oropharyngeal phase: Secondary | ICD-10-CM | POA: Diagnosis not present

## 2019-03-24 DIAGNOSIS — I248 Other forms of acute ischemic heart disease: Secondary | ICD-10-CM | POA: Diagnosis not present

## 2019-03-24 DIAGNOSIS — Z7409 Other reduced mobility: Secondary | ICD-10-CM | POA: Diagnosis not present

## 2019-03-24 DIAGNOSIS — E785 Hyperlipidemia, unspecified: Secondary | ICD-10-CM | POA: Diagnosis not present

## 2019-03-24 DIAGNOSIS — E43 Unspecified severe protein-calorie malnutrition: Secondary | ICD-10-CM | POA: Diagnosis not present

## 2019-03-24 DIAGNOSIS — Z950 Presence of cardiac pacemaker: Secondary | ICD-10-CM | POA: Diagnosis not present

## 2019-03-24 DIAGNOSIS — I69392 Facial weakness following cerebral infarction: Secondary | ICD-10-CM | POA: Diagnosis not present

## 2019-03-24 DIAGNOSIS — I69391 Dysphagia following cerebral infarction: Secondary | ICD-10-CM | POA: Diagnosis not present

## 2019-03-24 DIAGNOSIS — I4891 Unspecified atrial fibrillation: Secondary | ICD-10-CM | POA: Diagnosis not present

## 2019-03-24 DIAGNOSIS — S01112D Laceration without foreign body of left eyelid and periocular area, subsequent encounter: Secondary | ICD-10-CM | POA: Diagnosis not present

## 2019-03-24 DIAGNOSIS — I5022 Chronic systolic (congestive) heart failure: Secondary | ICD-10-CM | POA: Diagnosis not present

## 2019-03-24 DIAGNOSIS — I69354 Hemiplegia and hemiparesis following cerebral infarction affecting left non-dominant side: Secondary | ICD-10-CM | POA: Diagnosis not present

## 2019-03-24 DIAGNOSIS — I110 Hypertensive heart disease with heart failure: Secondary | ICD-10-CM | POA: Diagnosis not present

## 2019-03-24 DIAGNOSIS — I2510 Atherosclerotic heart disease of native coronary artery without angina pectoris: Secondary | ICD-10-CM | POA: Diagnosis not present

## 2019-03-24 DIAGNOSIS — I63511 Cerebral infarction due to unspecified occlusion or stenosis of right middle cerebral artery: Secondary | ICD-10-CM | POA: Diagnosis not present

## 2019-03-24 DIAGNOSIS — J449 Chronic obstructive pulmonary disease, unspecified: Secondary | ICD-10-CM | POA: Diagnosis not present

## 2019-03-24 DIAGNOSIS — I21A1 Myocardial infarction type 2: Secondary | ICD-10-CM | POA: Diagnosis not present

## 2019-03-24 DIAGNOSIS — I5020 Unspecified systolic (congestive) heart failure: Secondary | ICD-10-CM | POA: Diagnosis not present

## 2019-03-24 DIAGNOSIS — I509 Heart failure, unspecified: Secondary | ICD-10-CM | POA: Diagnosis not present

## 2019-03-24 DIAGNOSIS — I69322 Dysarthria following cerebral infarction: Secondary | ICD-10-CM | POA: Diagnosis not present

## 2019-03-24 DIAGNOSIS — G309 Alzheimer's disease, unspecified: Secondary | ICD-10-CM | POA: Diagnosis not present

## 2019-03-24 DIAGNOSIS — R251 Tremor, unspecified: Secondary | ICD-10-CM | POA: Diagnosis not present

## 2019-03-24 DIAGNOSIS — E119 Type 2 diabetes mellitus without complications: Secondary | ICD-10-CM | POA: Diagnosis not present

## 2019-03-25 ENCOUNTER — Other Ambulatory Visit: Payer: Self-pay

## 2019-03-25 NOTE — Patient Outreach (Signed)
[facility]) Care Management  03/25/2019  Mitchell Murray 02/22/1941 [id number]   Medication Adherence call to [name] Voice message left with a call back number. Mitchell Murray is showing past due on Atorvastatin 20 mg under [place].   [place] Management Direct Dial [phone]  Fax [phone] [email or web address]

## 2019-04-03 ENCOUNTER — Other Ambulatory Visit: Payer: Self-pay

## 2019-04-03 NOTE — Patient Outreach (Signed)
[facility]) Care Management  04/03/2019  Mitchell Murray [birth year] [id number]   Medication Adherence call to [name] Compliant Voice message left with a call back number. [name] is showing past due on Atorvastatin 20 mg under [place].   [place] Management Direct Dial [phone]  Fax [phone] [email or web address]

## 2019-04-15 DIAGNOSIS — Z7901 Long term (current) use of anticoagulants: Secondary | ICD-10-CM | POA: Diagnosis not present

## 2019-04-15 DIAGNOSIS — R29898 Other symptoms and signs involving the musculoskeletal system: Secondary | ICD-10-CM | POA: Diagnosis not present

## 2019-04-15 DIAGNOSIS — Z79899 Other long term (current) drug therapy: Secondary | ICD-10-CM | POA: Diagnosis not present

## 2019-04-15 DIAGNOSIS — Z8673 Personal history of transient ischemic attack (TIA), and cerebral infarction without residual deficits: Secondary | ICD-10-CM | POA: Diagnosis not present

## 2019-04-15 DIAGNOSIS — I4819 Other persistent atrial fibrillation: Secondary | ICD-10-CM | POA: Diagnosis not present

## 2019-04-15 DIAGNOSIS — I10 Essential (primary) hypertension: Secondary | ICD-10-CM | POA: Diagnosis not present

## 2019-04-15 DIAGNOSIS — E785 Hyperlipidemia, unspecified: Secondary | ICD-10-CM | POA: Diagnosis not present

## 2019-04-15 DIAGNOSIS — E063 Autoimmune thyroiditis: Secondary | ICD-10-CM | POA: Diagnosis not present

## 2019-04-15 DIAGNOSIS — I4891 Unspecified atrial fibrillation: Secondary | ICD-10-CM | POA: Diagnosis not present

## 2019-04-15 DIAGNOSIS — E118 Type 2 diabetes mellitus with unspecified complications: Secondary | ICD-10-CM | POA: Diagnosis not present

## 2019-04-15 DIAGNOSIS — R791 Abnormal coagulation profile: Secondary | ICD-10-CM | POA: Diagnosis not present

## 2019-04-15 DIAGNOSIS — I69320 Aphasia following cerebral infarction: Secondary | ICD-10-CM | POA: Diagnosis not present

## 2019-04-16 DIAGNOSIS — I2510 Atherosclerotic heart disease of native coronary artery without angina pectoris: Secondary | ICD-10-CM | POA: Diagnosis not present

## 2019-04-16 DIAGNOSIS — I420 Dilated cardiomyopathy: Secondary | ICD-10-CM | POA: Diagnosis not present

## 2019-04-16 DIAGNOSIS — I110 Hypertensive heart disease with heart failure: Secondary | ICD-10-CM | POA: Diagnosis not present

## 2019-04-16 DIAGNOSIS — Z95810 Presence of automatic (implantable) cardiac defibrillator: Secondary | ICD-10-CM | POA: Diagnosis not present

## 2019-04-16 DIAGNOSIS — I5022 Chronic systolic (congestive) heart failure: Secondary | ICD-10-CM | POA: Diagnosis not present

## 2019-04-17 DIAGNOSIS — K219 Gastro-esophageal reflux disease without esophagitis: Secondary | ICD-10-CM | POA: Diagnosis not present

## 2019-04-17 DIAGNOSIS — Z9181 History of falling: Secondary | ICD-10-CM | POA: Diagnosis not present

## 2019-04-17 DIAGNOSIS — I10 Essential (primary) hypertension: Secondary | ICD-10-CM | POA: Diagnosis not present

## 2019-04-17 DIAGNOSIS — E1140 Type 2 diabetes mellitus with diabetic neuropathy, unspecified: Secondary | ICD-10-CM | POA: Diagnosis not present

## 2019-04-17 DIAGNOSIS — I252 Old myocardial infarction: Secondary | ICD-10-CM | POA: Diagnosis not present

## 2019-04-17 DIAGNOSIS — N200 Calculus of kidney: Secondary | ICD-10-CM | POA: Diagnosis not present

## 2019-04-17 DIAGNOSIS — I2510 Atherosclerotic heart disease of native coronary artery without angina pectoris: Secondary | ICD-10-CM | POA: Diagnosis not present

## 2019-04-17 DIAGNOSIS — Z602 Problems related to living alone: Secondary | ICD-10-CM | POA: Diagnosis not present

## 2019-04-17 DIAGNOSIS — M1990 Unspecified osteoarthritis, unspecified site: Secondary | ICD-10-CM | POA: Diagnosis not present

## 2019-04-17 DIAGNOSIS — Z95810 Presence of automatic (implantable) cardiac defibrillator: Secondary | ICD-10-CM | POA: Diagnosis not present

## 2019-04-17 DIAGNOSIS — E785 Hyperlipidemia, unspecified: Secondary | ICD-10-CM | POA: Diagnosis not present

## 2019-04-17 DIAGNOSIS — Z7901 Long term (current) use of anticoagulants: Secondary | ICD-10-CM | POA: Diagnosis not present

## 2019-04-17 DIAGNOSIS — E039 Hypothyroidism, unspecified: Secondary | ICD-10-CM | POA: Diagnosis not present

## 2019-04-17 DIAGNOSIS — I69320 Aphasia following cerebral infarction: Secondary | ICD-10-CM | POA: Diagnosis not present

## 2019-04-17 DIAGNOSIS — I4891 Unspecified atrial fibrillation: Secondary | ICD-10-CM | POA: Diagnosis not present

## 2019-04-17 DIAGNOSIS — I420 Dilated cardiomyopathy: Secondary | ICD-10-CM | POA: Diagnosis not present

## 2019-04-22 DIAGNOSIS — M1990 Unspecified osteoarthritis, unspecified site: Secondary | ICD-10-CM | POA: Diagnosis not present

## 2019-04-22 DIAGNOSIS — E039 Hypothyroidism, unspecified: Secondary | ICD-10-CM | POA: Diagnosis not present

## 2019-04-22 DIAGNOSIS — Z9181 History of falling: Secondary | ICD-10-CM | POA: Diagnosis not present

## 2019-04-22 DIAGNOSIS — E785 Hyperlipidemia, unspecified: Secondary | ICD-10-CM | POA: Diagnosis not present

## 2019-04-22 DIAGNOSIS — I252 Old myocardial infarction: Secondary | ICD-10-CM | POA: Diagnosis not present

## 2019-04-22 DIAGNOSIS — E1140 Type 2 diabetes mellitus with diabetic neuropathy, unspecified: Secondary | ICD-10-CM | POA: Diagnosis not present

## 2019-04-22 DIAGNOSIS — I2510 Atherosclerotic heart disease of native coronary artery without angina pectoris: Secondary | ICD-10-CM | POA: Diagnosis not present

## 2019-04-22 DIAGNOSIS — I10 Essential (primary) hypertension: Secondary | ICD-10-CM | POA: Diagnosis not present

## 2019-04-22 DIAGNOSIS — Z7901 Long term (current) use of anticoagulants: Secondary | ICD-10-CM | POA: Diagnosis not present

## 2019-04-22 DIAGNOSIS — N200 Calculus of kidney: Secondary | ICD-10-CM | POA: Diagnosis not present

## 2019-04-22 DIAGNOSIS — I69320 Aphasia following cerebral infarction: Secondary | ICD-10-CM | POA: Diagnosis not present

## 2019-04-22 DIAGNOSIS — K219 Gastro-esophageal reflux disease without esophagitis: Secondary | ICD-10-CM | POA: Diagnosis not present

## 2019-04-22 DIAGNOSIS — Z602 Problems related to living alone: Secondary | ICD-10-CM | POA: Diagnosis not present

## 2019-04-22 DIAGNOSIS — I420 Dilated cardiomyopathy: Secondary | ICD-10-CM | POA: Diagnosis not present

## 2019-04-22 DIAGNOSIS — Z95810 Presence of automatic (implantable) cardiac defibrillator: Secondary | ICD-10-CM | POA: Diagnosis not present

## 2019-04-22 DIAGNOSIS — I4891 Unspecified atrial fibrillation: Secondary | ICD-10-CM | POA: Diagnosis not present

## 2019-04-30 DIAGNOSIS — E039 Hypothyroidism, unspecified: Secondary | ICD-10-CM | POA: Diagnosis not present

## 2019-04-30 DIAGNOSIS — K219 Gastro-esophageal reflux disease without esophagitis: Secondary | ICD-10-CM | POA: Diagnosis not present

## 2019-04-30 DIAGNOSIS — M1990 Unspecified osteoarthritis, unspecified site: Secondary | ICD-10-CM | POA: Diagnosis not present

## 2019-04-30 DIAGNOSIS — I69320 Aphasia following cerebral infarction: Secondary | ICD-10-CM | POA: Diagnosis not present

## 2019-04-30 DIAGNOSIS — E785 Hyperlipidemia, unspecified: Secondary | ICD-10-CM | POA: Diagnosis not present

## 2019-04-30 DIAGNOSIS — I4891 Unspecified atrial fibrillation: Secondary | ICD-10-CM | POA: Diagnosis not present

## 2019-04-30 DIAGNOSIS — Z95810 Presence of automatic (implantable) cardiac defibrillator: Secondary | ICD-10-CM | POA: Diagnosis not present

## 2019-04-30 DIAGNOSIS — Z9181 History of falling: Secondary | ICD-10-CM | POA: Diagnosis not present

## 2019-04-30 DIAGNOSIS — I2510 Atherosclerotic heart disease of native coronary artery without angina pectoris: Secondary | ICD-10-CM | POA: Diagnosis not present

## 2019-04-30 DIAGNOSIS — I420 Dilated cardiomyopathy: Secondary | ICD-10-CM | POA: Diagnosis not present

## 2019-04-30 DIAGNOSIS — Z602 Problems related to living alone: Secondary | ICD-10-CM | POA: Diagnosis not present

## 2019-04-30 DIAGNOSIS — I252 Old myocardial infarction: Secondary | ICD-10-CM | POA: Diagnosis not present

## 2019-04-30 DIAGNOSIS — N200 Calculus of kidney: Secondary | ICD-10-CM | POA: Diagnosis not present

## 2019-04-30 DIAGNOSIS — Z7901 Long term (current) use of anticoagulants: Secondary | ICD-10-CM | POA: Diagnosis not present

## 2019-04-30 DIAGNOSIS — E1140 Type 2 diabetes mellitus with diabetic neuropathy, unspecified: Secondary | ICD-10-CM | POA: Diagnosis not present

## 2019-04-30 DIAGNOSIS — I10 Essential (primary) hypertension: Secondary | ICD-10-CM | POA: Diagnosis not present

## 2019-05-07 DIAGNOSIS — Z7901 Long term (current) use of anticoagulants: Secondary | ICD-10-CM | POA: Diagnosis not present

## 2019-05-07 DIAGNOSIS — I420 Dilated cardiomyopathy: Secondary | ICD-10-CM | POA: Diagnosis not present

## 2019-05-07 DIAGNOSIS — K219 Gastro-esophageal reflux disease without esophagitis: Secondary | ICD-10-CM | POA: Diagnosis not present

## 2019-05-07 DIAGNOSIS — Z95810 Presence of automatic (implantable) cardiac defibrillator: Secondary | ICD-10-CM | POA: Diagnosis not present

## 2019-05-07 DIAGNOSIS — N200 Calculus of kidney: Secondary | ICD-10-CM | POA: Diagnosis not present

## 2019-05-07 DIAGNOSIS — I2510 Atherosclerotic heart disease of native coronary artery without angina pectoris: Secondary | ICD-10-CM | POA: Diagnosis not present

## 2019-05-07 DIAGNOSIS — I110 Hypertensive heart disease with heart failure: Secondary | ICD-10-CM | POA: Diagnosis not present

## 2019-05-07 DIAGNOSIS — I252 Old myocardial infarction: Secondary | ICD-10-CM | POA: Diagnosis not present

## 2019-05-07 DIAGNOSIS — M1990 Unspecified osteoarthritis, unspecified site: Secondary | ICD-10-CM | POA: Diagnosis not present

## 2019-05-07 DIAGNOSIS — R339 Retention of urine, unspecified: Secondary | ICD-10-CM | POA: Diagnosis not present

## 2019-05-07 DIAGNOSIS — I480 Paroxysmal atrial fibrillation: Secondary | ICD-10-CM | POA: Diagnosis not present

## 2019-05-07 DIAGNOSIS — E1140 Type 2 diabetes mellitus with diabetic neuropathy, unspecified: Secondary | ICD-10-CM | POA: Diagnosis not present

## 2019-05-07 DIAGNOSIS — B961 Klebsiella pneumoniae [K. pneumoniae] as the cause of diseases classified elsewhere: Secondary | ICD-10-CM | POA: Diagnosis not present

## 2019-05-07 DIAGNOSIS — Z602 Problems related to living alone: Secondary | ICD-10-CM | POA: Diagnosis not present

## 2019-05-07 DIAGNOSIS — E785 Hyperlipidemia, unspecified: Secondary | ICD-10-CM | POA: Diagnosis not present

## 2019-05-07 DIAGNOSIS — E1165 Type 2 diabetes mellitus with hyperglycemia: Secondary | ICD-10-CM | POA: Diagnosis not present

## 2019-05-07 DIAGNOSIS — I69320 Aphasia following cerebral infarction: Secondary | ICD-10-CM | POA: Diagnosis not present

## 2019-05-07 DIAGNOSIS — I5022 Chronic systolic (congestive) heart failure: Secondary | ICD-10-CM | POA: Diagnosis not present

## 2019-05-07 DIAGNOSIS — N390 Urinary tract infection, site not specified: Secondary | ICD-10-CM | POA: Diagnosis not present

## 2019-05-07 DIAGNOSIS — K56609 Unspecified intestinal obstruction, unspecified as to partial versus complete obstruction: Secondary | ICD-10-CM | POA: Diagnosis not present

## 2019-05-07 DIAGNOSIS — E039 Hypothyroidism, unspecified: Secondary | ICD-10-CM | POA: Diagnosis not present

## 2019-05-08 DIAGNOSIS — R1084 Generalized abdominal pain: Secondary | ICD-10-CM | POA: Diagnosis not present

## 2019-05-08 DIAGNOSIS — Z743 Need for continuous supervision: Secondary | ICD-10-CM | POA: Diagnosis not present

## 2019-05-08 DIAGNOSIS — E875 Hyperkalemia: Secondary | ICD-10-CM | POA: Diagnosis not present

## 2019-05-08 DIAGNOSIS — T50904A Poisoning by unspecified drugs, medicaments and biological substances, undetermined, initial encounter: Secondary | ICD-10-CM | POA: Diagnosis not present

## 2019-05-08 DIAGNOSIS — N200 Calculus of kidney: Secondary | ICD-10-CM | POA: Diagnosis not present

## 2019-05-08 DIAGNOSIS — N289 Disorder of kidney and ureter, unspecified: Secondary | ICD-10-CM | POA: Diagnosis not present

## 2019-05-08 DIAGNOSIS — T887XXA Unspecified adverse effect of drug or medicament, initial encounter: Secondary | ICD-10-CM | POA: Diagnosis not present

## 2019-05-08 DIAGNOSIS — K56609 Unspecified intestinal obstruction, unspecified as to partial versus complete obstruction: Secondary | ICD-10-CM | POA: Diagnosis not present

## 2019-05-08 DIAGNOSIS — R1030 Lower abdominal pain, unspecified: Secondary | ICD-10-CM | POA: Diagnosis not present

## 2019-05-09 DIAGNOSIS — K5651 Intestinal adhesions [bands], with partial obstruction: Secondary | ICD-10-CM | POA: Diagnosis not present

## 2019-05-09 DIAGNOSIS — I420 Dilated cardiomyopathy: Secondary | ICD-10-CM

## 2019-05-09 DIAGNOSIS — R109 Unspecified abdominal pain: Secondary | ICD-10-CM | POA: Diagnosis not present

## 2019-05-09 DIAGNOSIS — Z8719 Personal history of other diseases of the digestive system: Secondary | ICD-10-CM | POA: Diagnosis not present

## 2019-05-09 DIAGNOSIS — Z79899 Other long term (current) drug therapy: Secondary | ICD-10-CM | POA: Diagnosis not present

## 2019-05-09 DIAGNOSIS — B961 Klebsiella pneumoniae [K. pneumoniae] as the cause of diseases classified elsewhere: Secondary | ICD-10-CM | POA: Diagnosis not present

## 2019-05-09 DIAGNOSIS — I10 Essential (primary) hypertension: Secondary | ICD-10-CM

## 2019-05-09 DIAGNOSIS — Z7982 Long term (current) use of aspirin: Secondary | ICD-10-CM | POA: Diagnosis not present

## 2019-05-09 DIAGNOSIS — E875 Hyperkalemia: Secondary | ICD-10-CM | POA: Diagnosis not present

## 2019-05-09 DIAGNOSIS — Z91041 Radiographic dye allergy status: Secondary | ICD-10-CM | POA: Diagnosis not present

## 2019-05-09 DIAGNOSIS — Z7901 Long term (current) use of anticoagulants: Secondary | ICD-10-CM | POA: Diagnosis not present

## 2019-05-09 DIAGNOSIS — I252 Old myocardial infarction: Secondary | ICD-10-CM | POA: Diagnosis not present

## 2019-05-09 DIAGNOSIS — E7800 Pure hypercholesterolemia, unspecified: Secondary | ICD-10-CM | POA: Diagnosis not present

## 2019-05-09 DIAGNOSIS — E1165 Type 2 diabetes mellitus with hyperglycemia: Secondary | ICD-10-CM

## 2019-05-09 DIAGNOSIS — I480 Paroxysmal atrial fibrillation: Secondary | ICD-10-CM

## 2019-05-09 DIAGNOSIS — Z950 Presence of cardiac pacemaker: Secondary | ICD-10-CM | POA: Diagnosis not present

## 2019-05-09 DIAGNOSIS — I110 Hypertensive heart disease with heart failure: Secondary | ICD-10-CM | POA: Diagnosis not present

## 2019-05-09 DIAGNOSIS — I2510 Atherosclerotic heart disease of native coronary artery without angina pectoris: Secondary | ICD-10-CM

## 2019-05-09 DIAGNOSIS — N289 Disorder of kidney and ureter, unspecified: Secondary | ICD-10-CM

## 2019-05-09 DIAGNOSIS — M1990 Unspecified osteoarthritis, unspecified site: Secondary | ICD-10-CM | POA: Diagnosis not present

## 2019-05-09 DIAGNOSIS — K56609 Unspecified intestinal obstruction, unspecified as to partial versus complete obstruction: Secondary | ICD-10-CM | POA: Diagnosis not present

## 2019-05-09 DIAGNOSIS — Z8673 Personal history of transient ischemic attack (TIA), and cerebral infarction without residual deficits: Secondary | ICD-10-CM | POA: Diagnosis not present

## 2019-05-09 DIAGNOSIS — Z7984 Long term (current) use of oral hypoglycemic drugs: Secondary | ICD-10-CM | POA: Diagnosis not present

## 2019-05-09 DIAGNOSIS — J449 Chronic obstructive pulmonary disease, unspecified: Secondary | ICD-10-CM | POA: Diagnosis not present

## 2019-05-09 DIAGNOSIS — E039 Hypothyroidism, unspecified: Secondary | ICD-10-CM

## 2019-05-09 DIAGNOSIS — I5022 Chronic systolic (congestive) heart failure: Secondary | ICD-10-CM

## 2019-05-09 DIAGNOSIS — N390 Urinary tract infection, site not specified: Secondary | ICD-10-CM | POA: Diagnosis not present

## 2019-05-09 DIAGNOSIS — N200 Calculus of kidney: Secondary | ICD-10-CM | POA: Diagnosis not present

## 2019-05-12 ENCOUNTER — Other Ambulatory Visit: Payer: Self-pay | Admitting: *Deleted

## 2019-05-12 DIAGNOSIS — I10 Essential (primary) hypertension: Secondary | ICD-10-CM

## 2019-05-12 NOTE — Patient Outreach (Signed)
Per Patient Mitchell Murray member has had 6 ED visits/hospitalizations in the last 6 months. Most recent discharge from [facility] on 05/11/19. Appears, per Patient Mitchell Murray, most of member's admissions have been at [facility] and [facility].   Member screened for potential [facility] needs as a benefit of [facility] Medicare [place].  Telephone call made to Mitchell Murray at [phone]. No answer. Left HIPAA compliant voicemail message requesting return call.   Will make referral to [facility] for complex care management due to frequency of ED/hospitalizations.   Appears per chart records, Mitchell Murray has a history of CAD,  MI, atrial fibrillation, history of AICD/pacemaker placement, chronic systolic heart failure, hypertension, hyperlipidemia, type 2 diabetes, hypothyroidism, cholelithiasis, prostate cancer.   Marthenia Rolling, MSN-Ed, RN,BSN [place] Acute Care Coordinator [phone] [facility]) [phone]  (Toll free office)

## 2019-05-15 DIAGNOSIS — I2510 Atherosclerotic heart disease of native coronary artery without angina pectoris: Secondary | ICD-10-CM | POA: Diagnosis not present

## 2019-05-15 DIAGNOSIS — Z95810 Presence of automatic (implantable) cardiac defibrillator: Secondary | ICD-10-CM | POA: Diagnosis not present

## 2019-05-15 DIAGNOSIS — I252 Old myocardial infarction: Secondary | ICD-10-CM | POA: Diagnosis not present

## 2019-05-15 DIAGNOSIS — E1165 Type 2 diabetes mellitus with hyperglycemia: Secondary | ICD-10-CM | POA: Diagnosis not present

## 2019-05-15 DIAGNOSIS — I480 Paroxysmal atrial fibrillation: Secondary | ICD-10-CM | POA: Diagnosis not present

## 2019-05-15 DIAGNOSIS — I110 Hypertensive heart disease with heart failure: Secondary | ICD-10-CM | POA: Diagnosis not present

## 2019-05-15 DIAGNOSIS — B961 Klebsiella pneumoniae [K. pneumoniae] as the cause of diseases classified elsewhere: Secondary | ICD-10-CM | POA: Diagnosis not present

## 2019-05-15 DIAGNOSIS — E785 Hyperlipidemia, unspecified: Secondary | ICD-10-CM | POA: Diagnosis not present

## 2019-05-15 DIAGNOSIS — Z602 Problems related to living alone: Secondary | ICD-10-CM | POA: Diagnosis not present

## 2019-05-15 DIAGNOSIS — N390 Urinary tract infection, site not specified: Secondary | ICD-10-CM | POA: Diagnosis not present

## 2019-05-15 DIAGNOSIS — K56609 Unspecified intestinal obstruction, unspecified as to partial versus complete obstruction: Secondary | ICD-10-CM | POA: Diagnosis not present

## 2019-05-15 DIAGNOSIS — I69320 Aphasia following cerebral infarction: Secondary | ICD-10-CM | POA: Diagnosis not present

## 2019-05-15 DIAGNOSIS — R339 Retention of urine, unspecified: Secondary | ICD-10-CM | POA: Diagnosis not present

## 2019-05-15 DIAGNOSIS — E1140 Type 2 diabetes mellitus with diabetic neuropathy, unspecified: Secondary | ICD-10-CM | POA: Diagnosis not present

## 2019-05-15 DIAGNOSIS — N200 Calculus of kidney: Secondary | ICD-10-CM | POA: Diagnosis not present

## 2019-05-15 DIAGNOSIS — M1990 Unspecified osteoarthritis, unspecified site: Secondary | ICD-10-CM | POA: Diagnosis not present

## 2019-05-15 DIAGNOSIS — E039 Hypothyroidism, unspecified: Secondary | ICD-10-CM | POA: Diagnosis not present

## 2019-05-15 DIAGNOSIS — Z7901 Long term (current) use of anticoagulants: Secondary | ICD-10-CM | POA: Diagnosis not present

## 2019-05-15 DIAGNOSIS — K219 Gastro-esophageal reflux disease without esophagitis: Secondary | ICD-10-CM | POA: Diagnosis not present

## 2019-05-15 DIAGNOSIS — I420 Dilated cardiomyopathy: Secondary | ICD-10-CM | POA: Diagnosis not present

## 2019-05-15 DIAGNOSIS — I5022 Chronic systolic (congestive) heart failure: Secondary | ICD-10-CM | POA: Diagnosis not present

## 2019-05-16 ENCOUNTER — Encounter: Payer: Self-pay | Admitting: *Deleted

## 2019-05-16 ENCOUNTER — Other Ambulatory Visit: Payer: Self-pay | Admitting: *Deleted

## 2019-05-16 NOTE — Patient Outreach (Signed)
[facility]) [place] Telephone Outreach PCP office completes Transition of Care follow up post-hospital discharge Unsuccessful (consecutive) outreach attempt # 1  05/16/2019  Mitchell Murray [birth year] [id number]   Unsuccessful telephone outreach in coverage for primary [facility] RN CM Mitchell Murray, to Mitchell Murray, 79 y/o male referred to [facility] 05/13/2019 by [facility] RN CM PAC for multiple recent hospitalizations/ ED visits.  Patient has history including, but not limited to, CAD with previous ICD; CHF; A-Fib; HTN/ HLD; DM- II; GERD; and recent positive corona virus infection, requiring hospitalization.  HIPAA compliant voice mail message left for patient, requesting return call back.  Plan:  Will place [facility] CM unsuccessful patient outreach letter in mail requesting call back in writing  Will ensure [place] telephone outreach re-attempt within 4 business days if I do not hear back from patient first  Oneta Rack, RN, BSN, [employer] [facility] Care Management  [phone]

## 2019-05-19 ENCOUNTER — Other Ambulatory Visit: Payer: Self-pay | Admitting: *Deleted

## 2019-05-19 ENCOUNTER — Encounter: Payer: Self-pay | Admitting: *Deleted

## 2019-05-19 NOTE — Patient Outreach (Signed)
[facility]) [place] Telephone Outreach PCP office completes Transition of Care follow up post-hospital discharge Unsuccessful second (consecutive) outreach attempt- new patient   05/19/2019  [name] 11-07-1940 [id number]  Unsuccessful second telephone outreach attempt in coverage for primary [facility] RN CM Mitchell Murray, to [name], 79 y/o male referred to [facility] CM 05/13/2019 by [facility] RN CM PAC for multiple recent hospitalizations/ ED visits.  Patient has history including, but not limited to, CAD with previous ICD; CHF; A-Fib; HTN/ HLD; DM- II; GERD; and recent positive corona virus infection, requiring hospitalization.  HIPAA compliant voice mail message left for patient, requesting return call back.  Plan:  Verified [place] unsuccessful patient outreach letter in mail requesting call back in writing on Friday May 16, 2019  Will update primary [place] CM on today' second unsuccessful outreach attempt   Oneta Rack, RN, BSN, [place] [facility]  [phone]

## 2019-05-21 DIAGNOSIS — I420 Dilated cardiomyopathy: Secondary | ICD-10-CM | POA: Diagnosis not present

## 2019-05-21 DIAGNOSIS — M1990 Unspecified osteoarthritis, unspecified site: Secondary | ICD-10-CM | POA: Diagnosis not present

## 2019-05-21 DIAGNOSIS — K56609 Unspecified intestinal obstruction, unspecified as to partial versus complete obstruction: Secondary | ICD-10-CM | POA: Diagnosis not present

## 2019-05-21 DIAGNOSIS — I252 Old myocardial infarction: Secondary | ICD-10-CM | POA: Diagnosis not present

## 2019-05-21 DIAGNOSIS — E785 Hyperlipidemia, unspecified: Secondary | ICD-10-CM | POA: Diagnosis not present

## 2019-05-21 DIAGNOSIS — Z7901 Long term (current) use of anticoagulants: Secondary | ICD-10-CM | POA: Diagnosis not present

## 2019-05-21 DIAGNOSIS — I69320 Aphasia following cerebral infarction: Secondary | ICD-10-CM | POA: Diagnosis not present

## 2019-05-21 DIAGNOSIS — E039 Hypothyroidism, unspecified: Secondary | ICD-10-CM | POA: Diagnosis not present

## 2019-05-21 DIAGNOSIS — N200 Calculus of kidney: Secondary | ICD-10-CM | POA: Diagnosis not present

## 2019-05-21 DIAGNOSIS — I480 Paroxysmal atrial fibrillation: Secondary | ICD-10-CM | POA: Diagnosis not present

## 2019-05-21 DIAGNOSIS — E1140 Type 2 diabetes mellitus with diabetic neuropathy, unspecified: Secondary | ICD-10-CM | POA: Diagnosis not present

## 2019-05-21 DIAGNOSIS — Z95810 Presence of automatic (implantable) cardiac defibrillator: Secondary | ICD-10-CM | POA: Diagnosis not present

## 2019-05-21 DIAGNOSIS — B961 Klebsiella pneumoniae [K. pneumoniae] as the cause of diseases classified elsewhere: Secondary | ICD-10-CM | POA: Diagnosis not present

## 2019-05-21 DIAGNOSIS — I110 Hypertensive heart disease with heart failure: Secondary | ICD-10-CM | POA: Diagnosis not present

## 2019-05-21 DIAGNOSIS — I5022 Chronic systolic (congestive) heart failure: Secondary | ICD-10-CM | POA: Diagnosis not present

## 2019-05-21 DIAGNOSIS — R339 Retention of urine, unspecified: Secondary | ICD-10-CM | POA: Diagnosis not present

## 2019-05-21 DIAGNOSIS — Z602 Problems related to living alone: Secondary | ICD-10-CM | POA: Diagnosis not present

## 2019-05-21 DIAGNOSIS — I2510 Atherosclerotic heart disease of native coronary artery without angina pectoris: Secondary | ICD-10-CM | POA: Diagnosis not present

## 2019-05-21 DIAGNOSIS — N390 Urinary tract infection, site not specified: Secondary | ICD-10-CM | POA: Diagnosis not present

## 2019-05-21 DIAGNOSIS — K219 Gastro-esophageal reflux disease without esophagitis: Secondary | ICD-10-CM | POA: Diagnosis not present

## 2019-05-21 DIAGNOSIS — E1165 Type 2 diabetes mellitus with hyperglycemia: Secondary | ICD-10-CM | POA: Diagnosis not present

## 2019-05-23 ENCOUNTER — Other Ambulatory Visit: Payer: Self-pay

## 2019-05-23 NOTE — Patient Outreach (Signed)
Telephone assessment:  Placed call to patient for 3rd outreach attempt. Patient answered and reports that he is doing well.  Reviewed reason for call and explained [facility] program.  Patient states that he is not sure if he is interested. I offered to mail a new patient packet and call patient back in 1 week.   Patient agreed.   PLAN: mail new patient packet and letter and call back in 1 week.  Tomasa Rand, RN, BSN, CEN [facility] [employer] [phone]

## 2019-05-28 DIAGNOSIS — E785 Hyperlipidemia, unspecified: Secondary | ICD-10-CM | POA: Diagnosis not present

## 2019-05-28 DIAGNOSIS — I252 Old myocardial infarction: Secondary | ICD-10-CM | POA: Diagnosis not present

## 2019-05-28 DIAGNOSIS — Z602 Problems related to living alone: Secondary | ICD-10-CM | POA: Diagnosis not present

## 2019-05-28 DIAGNOSIS — I5022 Chronic systolic (congestive) heart failure: Secondary | ICD-10-CM | POA: Diagnosis not present

## 2019-05-28 DIAGNOSIS — I480 Paroxysmal atrial fibrillation: Secondary | ICD-10-CM | POA: Diagnosis not present

## 2019-05-28 DIAGNOSIS — E1165 Type 2 diabetes mellitus with hyperglycemia: Secondary | ICD-10-CM | POA: Diagnosis not present

## 2019-05-28 DIAGNOSIS — K56609 Unspecified intestinal obstruction, unspecified as to partial versus complete obstruction: Secondary | ICD-10-CM | POA: Diagnosis not present

## 2019-05-28 DIAGNOSIS — K56600 Partial intestinal obstruction, unspecified as to cause: Secondary | ICD-10-CM | POA: Diagnosis not present

## 2019-05-28 DIAGNOSIS — I110 Hypertensive heart disease with heart failure: Secondary | ICD-10-CM | POA: Diagnosis not present

## 2019-05-28 DIAGNOSIS — E039 Hypothyroidism, unspecified: Secondary | ICD-10-CM | POA: Diagnosis not present

## 2019-05-28 DIAGNOSIS — K219 Gastro-esophageal reflux disease without esophagitis: Secondary | ICD-10-CM | POA: Diagnosis not present

## 2019-05-28 DIAGNOSIS — E1140 Type 2 diabetes mellitus with diabetic neuropathy, unspecified: Secondary | ICD-10-CM | POA: Diagnosis not present

## 2019-05-28 DIAGNOSIS — B961 Klebsiella pneumoniae [K. pneumoniae] as the cause of diseases classified elsewhere: Secondary | ICD-10-CM | POA: Diagnosis not present

## 2019-05-28 DIAGNOSIS — I69320 Aphasia following cerebral infarction: Secondary | ICD-10-CM | POA: Diagnosis not present

## 2019-05-28 DIAGNOSIS — Z7901 Long term (current) use of anticoagulants: Secondary | ICD-10-CM | POA: Diagnosis not present

## 2019-05-28 DIAGNOSIS — M1990 Unspecified osteoarthritis, unspecified site: Secondary | ICD-10-CM | POA: Diagnosis not present

## 2019-05-28 DIAGNOSIS — I2510 Atherosclerotic heart disease of native coronary artery without angina pectoris: Secondary | ICD-10-CM | POA: Diagnosis not present

## 2019-05-28 DIAGNOSIS — N200 Calculus of kidney: Secondary | ICD-10-CM | POA: Diagnosis not present

## 2019-05-28 DIAGNOSIS — Z95810 Presence of automatic (implantable) cardiac defibrillator: Secondary | ICD-10-CM | POA: Diagnosis not present

## 2019-05-28 DIAGNOSIS — N390 Urinary tract infection, site not specified: Secondary | ICD-10-CM | POA: Diagnosis not present

## 2019-05-28 DIAGNOSIS — I420 Dilated cardiomyopathy: Secondary | ICD-10-CM | POA: Diagnosis not present

## 2019-05-28 DIAGNOSIS — R339 Retention of urine, unspecified: Secondary | ICD-10-CM | POA: Diagnosis not present

## 2019-05-29 DIAGNOSIS — I2510 Atherosclerotic heart disease of native coronary artery without angina pectoris: Secondary | ICD-10-CM | POA: Diagnosis not present

## 2019-05-29 DIAGNOSIS — E039 Hypothyroidism, unspecified: Secondary | ICD-10-CM | POA: Diagnosis not present

## 2019-05-29 DIAGNOSIS — I5022 Chronic systolic (congestive) heart failure: Secondary | ICD-10-CM | POA: Diagnosis not present

## 2019-05-29 DIAGNOSIS — Z95810 Presence of automatic (implantable) cardiac defibrillator: Secondary | ICD-10-CM | POA: Diagnosis not present

## 2019-05-29 DIAGNOSIS — I110 Hypertensive heart disease with heart failure: Secondary | ICD-10-CM | POA: Diagnosis not present

## 2019-05-29 DIAGNOSIS — K219 Gastro-esophageal reflux disease without esophagitis: Secondary | ICD-10-CM | POA: Diagnosis not present

## 2019-05-29 DIAGNOSIS — E785 Hyperlipidemia, unspecified: Secondary | ICD-10-CM | POA: Diagnosis not present

## 2019-05-29 DIAGNOSIS — M1990 Unspecified osteoarthritis, unspecified site: Secondary | ICD-10-CM | POA: Diagnosis not present

## 2019-05-29 DIAGNOSIS — E1140 Type 2 diabetes mellitus with diabetic neuropathy, unspecified: Secondary | ICD-10-CM | POA: Diagnosis not present

## 2019-05-29 DIAGNOSIS — R296 Repeated falls: Secondary | ICD-10-CM | POA: Diagnosis not present

## 2019-05-29 DIAGNOSIS — N390 Urinary tract infection, site not specified: Secondary | ICD-10-CM | POA: Diagnosis not present

## 2019-05-29 DIAGNOSIS — Z602 Problems related to living alone: Secondary | ICD-10-CM | POA: Diagnosis not present

## 2019-05-29 DIAGNOSIS — S0990XA Unspecified injury of head, initial encounter: Secondary | ICD-10-CM | POA: Diagnosis not present

## 2019-05-29 DIAGNOSIS — B961 Klebsiella pneumoniae [K. pneumoniae] as the cause of diseases classified elsewhere: Secondary | ICD-10-CM | POA: Diagnosis not present

## 2019-05-29 DIAGNOSIS — N200 Calculus of kidney: Secondary | ICD-10-CM | POA: Diagnosis not present

## 2019-05-29 DIAGNOSIS — I4891 Unspecified atrial fibrillation: Secondary | ICD-10-CM | POA: Diagnosis not present

## 2019-05-29 DIAGNOSIS — I480 Paroxysmal atrial fibrillation: Secondary | ICD-10-CM | POA: Diagnosis not present

## 2019-05-29 DIAGNOSIS — E1165 Type 2 diabetes mellitus with hyperglycemia: Secondary | ICD-10-CM | POA: Diagnosis not present

## 2019-05-29 DIAGNOSIS — I252 Old myocardial infarction: Secondary | ICD-10-CM | POA: Diagnosis not present

## 2019-05-29 DIAGNOSIS — K56609 Unspecified intestinal obstruction, unspecified as to partial versus complete obstruction: Secondary | ICD-10-CM | POA: Diagnosis not present

## 2019-05-29 DIAGNOSIS — R339 Retention of urine, unspecified: Secondary | ICD-10-CM | POA: Diagnosis not present

## 2019-05-29 DIAGNOSIS — Z87440 Personal history of urinary (tract) infections: Secondary | ICD-10-CM | POA: Diagnosis not present

## 2019-05-29 DIAGNOSIS — I420 Dilated cardiomyopathy: Secondary | ICD-10-CM | POA: Diagnosis not present

## 2019-05-29 DIAGNOSIS — Z7901 Long term (current) use of anticoagulants: Secondary | ICD-10-CM | POA: Diagnosis not present

## 2019-05-29 DIAGNOSIS — I69320 Aphasia following cerebral infarction: Secondary | ICD-10-CM | POA: Diagnosis not present

## 2019-06-02 ENCOUNTER — Other Ambulatory Visit: Payer: Self-pay

## 2019-06-02 DIAGNOSIS — I110 Hypertensive heart disease with heart failure: Secondary | ICD-10-CM | POA: Diagnosis not present

## 2019-06-02 DIAGNOSIS — E1165 Type 2 diabetes mellitus with hyperglycemia: Secondary | ICD-10-CM | POA: Diagnosis not present

## 2019-06-02 DIAGNOSIS — N390 Urinary tract infection, site not specified: Secondary | ICD-10-CM | POA: Diagnosis not present

## 2019-06-02 DIAGNOSIS — N200 Calculus of kidney: Secondary | ICD-10-CM | POA: Diagnosis not present

## 2019-06-02 DIAGNOSIS — Z95810 Presence of automatic (implantable) cardiac defibrillator: Secondary | ICD-10-CM | POA: Diagnosis not present

## 2019-06-02 DIAGNOSIS — R339 Retention of urine, unspecified: Secondary | ICD-10-CM | POA: Diagnosis not present

## 2019-06-02 DIAGNOSIS — E785 Hyperlipidemia, unspecified: Secondary | ICD-10-CM | POA: Diagnosis not present

## 2019-06-02 DIAGNOSIS — I480 Paroxysmal atrial fibrillation: Secondary | ICD-10-CM | POA: Diagnosis not present

## 2019-06-02 DIAGNOSIS — Z602 Problems related to living alone: Secondary | ICD-10-CM | POA: Diagnosis not present

## 2019-06-02 DIAGNOSIS — E1140 Type 2 diabetes mellitus with diabetic neuropathy, unspecified: Secondary | ICD-10-CM | POA: Diagnosis not present

## 2019-06-02 DIAGNOSIS — I420 Dilated cardiomyopathy: Secondary | ICD-10-CM | POA: Diagnosis not present

## 2019-06-02 DIAGNOSIS — Z7901 Long term (current) use of anticoagulants: Secondary | ICD-10-CM | POA: Diagnosis not present

## 2019-06-02 DIAGNOSIS — I5022 Chronic systolic (congestive) heart failure: Secondary | ICD-10-CM | POA: Diagnosis not present

## 2019-06-02 DIAGNOSIS — B961 Klebsiella pneumoniae [K. pneumoniae] as the cause of diseases classified elsewhere: Secondary | ICD-10-CM | POA: Diagnosis not present

## 2019-06-02 DIAGNOSIS — K219 Gastro-esophageal reflux disease without esophagitis: Secondary | ICD-10-CM | POA: Diagnosis not present

## 2019-06-02 DIAGNOSIS — I2510 Atherosclerotic heart disease of native coronary artery without angina pectoris: Secondary | ICD-10-CM | POA: Diagnosis not present

## 2019-06-02 DIAGNOSIS — I252 Old myocardial infarction: Secondary | ICD-10-CM | POA: Diagnosis not present

## 2019-06-02 DIAGNOSIS — M1990 Unspecified osteoarthritis, unspecified site: Secondary | ICD-10-CM | POA: Diagnosis not present

## 2019-06-02 DIAGNOSIS — K56609 Unspecified intestinal obstruction, unspecified as to partial versus complete obstruction: Secondary | ICD-10-CM | POA: Diagnosis not present

## 2019-06-02 DIAGNOSIS — I69320 Aphasia following cerebral infarction: Secondary | ICD-10-CM | POA: Diagnosis not present

## 2019-06-02 DIAGNOSIS — E039 Hypothyroidism, unspecified: Secondary | ICD-10-CM | POA: Diagnosis not present

## 2019-06-02 NOTE — Patient Outreach (Signed)
Telephone assessment:  Placed call to patient who states that he has not gotten mailing yet.    Reviewed with patient he should have gotten mailing last week.  PLAN: will call patient back this week to inquire about mailing. Will remail packet.  Tomasa Rand, RN, BSN, CEN [facility] [employer] [phone]

## 2019-06-05 DIAGNOSIS — Z87442 Personal history of urinary calculi: Secondary | ICD-10-CM | POA: Diagnosis not present

## 2019-06-06 ENCOUNTER — Other Ambulatory Visit: Payer: Self-pay

## 2019-06-06 NOTE — Patient Outreach (Signed)
Telephone assessment:  Placed call to patient to determine interest in [facility] program. No answer. Left a message requesting a call back.  PLAN: will attempt another outreach in 3 days.  Tomasa Rand, RN, BSN, CEN [facility] [employer] [phone]

## 2019-06-10 ENCOUNTER — Other Ambulatory Visit: Payer: Self-pay

## 2019-06-10 NOTE — Patient Outreach (Signed)
Screening: Primary MD: Dr. Micheal Likens Insurance: UHC  Diagnosis: Stoke in 03/2019   Message received from [facility] office about patient. Sister called and wanted a call back.  Placed call to sister with no answer. Placed call to patient who answered. Patient provided consent to speak with sister Ilona Sorrel.   Sister reports patient is living home alone without difficulty. Sister calls patient in the mornings and in the evenings. Reports she makes sure he has food in his house. Patient is able to drive himself to go wherever he needs to.  Patient is currently active with PT.  Denies using walker or cane. Has fallen 2 times due to stroke.  Sister reports patient is eating and sleeping well. Sister manages all care and has questions about medications and medication cost.  Denies any other concerns today.  PLAN: Close to nursing. Placed order for [facility] pharmacy as sister request assistance.  Tomasa Rand, RN, BSN, CEN [facility] [employer] [phone]

## 2019-06-11 ENCOUNTER — Other Ambulatory Visit: Payer: Self-pay | Admitting: Pharmacist

## 2019-06-11 ENCOUNTER — Other Ambulatory Visit: Payer: Self-pay

## 2019-06-11 ENCOUNTER — Encounter: Payer: Self-pay | Admitting: Pharmacist

## 2019-06-11 NOTE — Patient Outreach (Signed)
[place] [facility]) Care Management  06/11/2019  Mitchell Murray May 31, 1940 [id number]   Patient's sister was called regarding medication assistance per referral. Unfortunately, she did not answer the phone and did not have a voicemail set up.  HIPAA compliant message could not be left.  Plan: Send patient an unsuccessful contact letter.

## 2019-06-12 DIAGNOSIS — I420 Dilated cardiomyopathy: Secondary | ICD-10-CM | POA: Diagnosis not present

## 2019-06-12 DIAGNOSIS — N390 Urinary tract infection, site not specified: Secondary | ICD-10-CM | POA: Diagnosis not present

## 2019-06-12 DIAGNOSIS — B961 Klebsiella pneumoniae [K. pneumoniae] as the cause of diseases classified elsewhere: Secondary | ICD-10-CM | POA: Diagnosis not present

## 2019-06-12 DIAGNOSIS — Z95810 Presence of automatic (implantable) cardiac defibrillator: Secondary | ICD-10-CM | POA: Diagnosis not present

## 2019-06-12 DIAGNOSIS — I69320 Aphasia following cerebral infarction: Secondary | ICD-10-CM | POA: Diagnosis not present

## 2019-06-12 DIAGNOSIS — E039 Hypothyroidism, unspecified: Secondary | ICD-10-CM | POA: Diagnosis not present

## 2019-06-12 DIAGNOSIS — E1140 Type 2 diabetes mellitus with diabetic neuropathy, unspecified: Secondary | ICD-10-CM | POA: Diagnosis not present

## 2019-06-12 DIAGNOSIS — I480 Paroxysmal atrial fibrillation: Secondary | ICD-10-CM | POA: Diagnosis not present

## 2019-06-12 DIAGNOSIS — R339 Retention of urine, unspecified: Secondary | ICD-10-CM | POA: Diagnosis not present

## 2019-06-12 DIAGNOSIS — E785 Hyperlipidemia, unspecified: Secondary | ICD-10-CM | POA: Diagnosis not present

## 2019-06-12 DIAGNOSIS — Z602 Problems related to living alone: Secondary | ICD-10-CM | POA: Diagnosis not present

## 2019-06-12 DIAGNOSIS — M1990 Unspecified osteoarthritis, unspecified site: Secondary | ICD-10-CM | POA: Diagnosis not present

## 2019-06-12 DIAGNOSIS — I110 Hypertensive heart disease with heart failure: Secondary | ICD-10-CM | POA: Diagnosis not present

## 2019-06-12 DIAGNOSIS — E1165 Type 2 diabetes mellitus with hyperglycemia: Secondary | ICD-10-CM | POA: Diagnosis not present

## 2019-06-12 DIAGNOSIS — I252 Old myocardial infarction: Secondary | ICD-10-CM | POA: Diagnosis not present

## 2019-06-12 DIAGNOSIS — K219 Gastro-esophageal reflux disease without esophagitis: Secondary | ICD-10-CM | POA: Diagnosis not present

## 2019-06-12 DIAGNOSIS — I2510 Atherosclerotic heart disease of native coronary artery without angina pectoris: Secondary | ICD-10-CM | POA: Diagnosis not present

## 2019-06-12 DIAGNOSIS — I5022 Chronic systolic (congestive) heart failure: Secondary | ICD-10-CM | POA: Diagnosis not present

## 2019-06-12 DIAGNOSIS — Z7901 Long term (current) use of anticoagulants: Secondary | ICD-10-CM | POA: Diagnosis not present

## 2019-06-12 DIAGNOSIS — K56609 Unspecified intestinal obstruction, unspecified as to partial versus complete obstruction: Secondary | ICD-10-CM | POA: Diagnosis not present

## 2019-06-12 DIAGNOSIS — N200 Calculus of kidney: Secondary | ICD-10-CM | POA: Diagnosis not present

## 2019-06-12 NOTE — Patient Outreach (Signed)
[facility]) Care Management  06/12/2019  Mitchell Murray [birth year] [id number]   Patient and his sister were called regarding medication assistance and medication questions. Unfortunately, neither of them answered the phone. HIPAA compliant message was left on the patient's voicemail. The patient's sister's voicemail was not available.  Plan: Send an unsuccessful outreach letter Call patient back in 5-7 business days.  Elayne Guerin, PharmD, [place] Clinical Pharmacist [phone]

## 2019-06-13 DIAGNOSIS — E1140 Type 2 diabetes mellitus with diabetic neuropathy, unspecified: Secondary | ICD-10-CM | POA: Diagnosis not present

## 2019-06-13 DIAGNOSIS — I69320 Aphasia following cerebral infarction: Secondary | ICD-10-CM | POA: Diagnosis not present

## 2019-06-13 DIAGNOSIS — R339 Retention of urine, unspecified: Secondary | ICD-10-CM | POA: Diagnosis not present

## 2019-06-13 DIAGNOSIS — Z7901 Long term (current) use of anticoagulants: Secondary | ICD-10-CM | POA: Diagnosis not present

## 2019-06-13 DIAGNOSIS — I252 Old myocardial infarction: Secondary | ICD-10-CM | POA: Diagnosis not present

## 2019-06-13 DIAGNOSIS — M1990 Unspecified osteoarthritis, unspecified site: Secondary | ICD-10-CM | POA: Diagnosis not present

## 2019-06-13 DIAGNOSIS — I110 Hypertensive heart disease with heart failure: Secondary | ICD-10-CM | POA: Diagnosis not present

## 2019-06-13 DIAGNOSIS — E785 Hyperlipidemia, unspecified: Secondary | ICD-10-CM | POA: Diagnosis not present

## 2019-06-13 DIAGNOSIS — I2510 Atherosclerotic heart disease of native coronary artery without angina pectoris: Secondary | ICD-10-CM | POA: Diagnosis not present

## 2019-06-13 DIAGNOSIS — E039 Hypothyroidism, unspecified: Secondary | ICD-10-CM | POA: Diagnosis not present

## 2019-06-13 DIAGNOSIS — B961 Klebsiella pneumoniae [K. pneumoniae] as the cause of diseases classified elsewhere: Secondary | ICD-10-CM | POA: Diagnosis not present

## 2019-06-13 DIAGNOSIS — I480 Paroxysmal atrial fibrillation: Secondary | ICD-10-CM | POA: Diagnosis not present

## 2019-06-13 DIAGNOSIS — Z602 Problems related to living alone: Secondary | ICD-10-CM | POA: Diagnosis not present

## 2019-06-13 DIAGNOSIS — I5022 Chronic systolic (congestive) heart failure: Secondary | ICD-10-CM | POA: Diagnosis not present

## 2019-06-13 DIAGNOSIS — K219 Gastro-esophageal reflux disease without esophagitis: Secondary | ICD-10-CM | POA: Diagnosis not present

## 2019-06-13 DIAGNOSIS — N200 Calculus of kidney: Secondary | ICD-10-CM | POA: Diagnosis not present

## 2019-06-13 DIAGNOSIS — I420 Dilated cardiomyopathy: Secondary | ICD-10-CM | POA: Diagnosis not present

## 2019-06-13 DIAGNOSIS — E1165 Type 2 diabetes mellitus with hyperglycemia: Secondary | ICD-10-CM | POA: Diagnosis not present

## 2019-06-13 DIAGNOSIS — N390 Urinary tract infection, site not specified: Secondary | ICD-10-CM | POA: Diagnosis not present

## 2019-06-13 DIAGNOSIS — K56609 Unspecified intestinal obstruction, unspecified as to partial versus complete obstruction: Secondary | ICD-10-CM | POA: Diagnosis not present

## 2019-06-13 DIAGNOSIS — Z95810 Presence of automatic (implantable) cardiac defibrillator: Secondary | ICD-10-CM | POA: Diagnosis not present

## 2019-06-17 ENCOUNTER — Other Ambulatory Visit: Payer: Self-pay | Admitting: Pharmacist

## 2019-06-17 ENCOUNTER — Encounter: Payer: Self-pay | Admitting: Pharmacist

## 2019-06-17 ENCOUNTER — Ambulatory Visit: Payer: Self-pay | Admitting: Pharmacist

## 2019-06-17 DIAGNOSIS — Z7901 Long term (current) use of anticoagulants: Secondary | ICD-10-CM | POA: Diagnosis not present

## 2019-06-17 DIAGNOSIS — E1140 Type 2 diabetes mellitus with diabetic neuropathy, unspecified: Secondary | ICD-10-CM | POA: Diagnosis not present

## 2019-06-17 DIAGNOSIS — K56609 Unspecified intestinal obstruction, unspecified as to partial versus complete obstruction: Secondary | ICD-10-CM | POA: Diagnosis not present

## 2019-06-17 DIAGNOSIS — I5022 Chronic systolic (congestive) heart failure: Secondary | ICD-10-CM | POA: Diagnosis not present

## 2019-06-17 DIAGNOSIS — E785 Hyperlipidemia, unspecified: Secondary | ICD-10-CM | POA: Diagnosis not present

## 2019-06-17 DIAGNOSIS — N390 Urinary tract infection, site not specified: Secondary | ICD-10-CM | POA: Diagnosis not present

## 2019-06-17 DIAGNOSIS — Z95810 Presence of automatic (implantable) cardiac defibrillator: Secondary | ICD-10-CM | POA: Diagnosis not present

## 2019-06-17 DIAGNOSIS — I69320 Aphasia following cerebral infarction: Secondary | ICD-10-CM | POA: Diagnosis not present

## 2019-06-17 DIAGNOSIS — I110 Hypertensive heart disease with heart failure: Secondary | ICD-10-CM | POA: Diagnosis not present

## 2019-06-17 DIAGNOSIS — N200 Calculus of kidney: Secondary | ICD-10-CM | POA: Diagnosis not present

## 2019-06-17 DIAGNOSIS — Z602 Problems related to living alone: Secondary | ICD-10-CM | POA: Diagnosis not present

## 2019-06-17 DIAGNOSIS — I2510 Atherosclerotic heart disease of native coronary artery without angina pectoris: Secondary | ICD-10-CM | POA: Diagnosis not present

## 2019-06-17 DIAGNOSIS — E039 Hypothyroidism, unspecified: Secondary | ICD-10-CM | POA: Diagnosis not present

## 2019-06-17 DIAGNOSIS — I420 Dilated cardiomyopathy: Secondary | ICD-10-CM | POA: Diagnosis not present

## 2019-06-17 DIAGNOSIS — I480 Paroxysmal atrial fibrillation: Secondary | ICD-10-CM | POA: Diagnosis not present

## 2019-06-17 DIAGNOSIS — I252 Old myocardial infarction: Secondary | ICD-10-CM | POA: Diagnosis not present

## 2019-06-17 DIAGNOSIS — E1165 Type 2 diabetes mellitus with hyperglycemia: Secondary | ICD-10-CM | POA: Diagnosis not present

## 2019-06-17 DIAGNOSIS — K219 Gastro-esophageal reflux disease without esophagitis: Secondary | ICD-10-CM | POA: Diagnosis not present

## 2019-06-17 DIAGNOSIS — M1990 Unspecified osteoarthritis, unspecified site: Secondary | ICD-10-CM | POA: Diagnosis not present

## 2019-06-17 DIAGNOSIS — R339 Retention of urine, unspecified: Secondary | ICD-10-CM | POA: Diagnosis not present

## 2019-06-17 DIAGNOSIS — B961 Klebsiella pneumoniae [K. pneumoniae] as the cause of diseases classified elsewhere: Secondary | ICD-10-CM | POA: Diagnosis not present

## 2019-06-17 NOTE — Patient Outreach (Addendum)
[place] [facility]) Care Management  06/17/2019  Mitchell Murray 01/23/1941 [id number]  Patient was called regarding medication assistance. HIPAA identifiers were obtained. Unfortunately, patient nor his daughter answered their phones. HIPAA compliant messages were left on both numbers in the patient's chart.  Today's call was the 2nd unsuccessful phone call. Unsuccessful outreach letter has been sent.  Plan: Call patient back in 10-14 business days.  Elayne Guerin, PharmD, BCACP [facility] Clinical Pharmacist [phone]  [facility] [place] [facility]) Care Management  [place]   06/17/2019  Mitchell Murray 24-Apr-1941 [id number]  Reason for referral: medication assistance  Referral source: [place] Nurse Referral medication(s): Eliquis Current insurance:UHC  PMHx:  Patient is a 79 year old male with multiple medical conditions including but not limited to:  CAD, Afib with defibrillator placement, hypertension, GERD, hyperlipidemia,  Hypothyroidism, history of prostate cancer and type 2 diabetes.  Objective: Allergies  Allergen Reactions  . Ivp Dye [Iodinated Diagnostic Agents] Anaphylaxis  . Metrizamide Anaphylaxis    Medications Reviewed Today    Reviewed by Knox Royalty, RN (Registered Nurse) on 05/19/19 at 1238  Med List Status: <None>  Medication Order Taking? Sig Documenting Provider Last Dose Status Informant  aspirin 81 MG EC tablet [id number]  Take 81 mg by mouth once a week. [provider]  Active Multiple Informants  atorvastatin (LIPITOR) 20 MG tablet [id number] No Take 20 mg by mouth every evening.  [provider] Past Week Unknown time Active Multiple Informants  cephALEXin (KEFLEX) 500 MG capsule [id number]  Take 1 capsule (500 mg total) by mouth 3 (three) times daily. Drenda Freeze, MD  Active   dexamethasone (DECADRON) 2 MG tablet [id number]  Take 6 mg by mouth daily. [provider]  Active Multiple  Informants  digoxin (LANOXIN) 0.125 MG tablet [id number] No Take 0.125 mg by mouth daily. [provider] Past Week Unknown time Active Multiple Informants  gabapentin (NEURONTIN) 100 MG capsule [id number]  Take 100 mg by mouth 2 (two) times daily. [provider]  Active Multiple Informants  HYDROcodone-acetaminophen (NORCO/VICODIN) 5-325 MG tablet [id number]  Take 1 tablet by mouth every 4 (four) hours as needed. Drenda Freeze, MD  Active   levothyroxine (SYNTHROID, LEVOTHROID) 50 MCG tablet [id number] No Take 50 mcg by mouth daily before breakfast. [provider] Past Week Unknown time Active Multiple Informants  loperamide (IMODIUM) 2 MG capsule [id number]  Take 2 mg by mouth 4 (four) times daily as needed. [provider]  Active Multiple Informants  meclizine (ANTIVERT) 25 MG tablet [id number] No Take 12.5 mg by mouth 3 (three) times daily as needed for dizziness.  [provider] Past Month Unknown time Active Multiple Informants  metFORMIN (GLUCOPHAGE) 1000 MG tablet [id number] No Take 1,000 mg by mouth 2 (two) times daily. [provider] Past Week Unknown time Active Multiple Informants  metoprolol succinate (TOPROL-XL) 50 MG 24 hr tablet [id number] No Take 75 mg by mouth daily. Take with or immediately following a meal.  [provider] 02/26/2019 2100 Active Multiple Informants           Med Note (GREEN, FELICIA D   Sat Aug 25, 2017 10:28 PM) Pt reports taking 1 & 1/2 tablets daily   Omega-3 1000 MG CAPS [id number] No Take 1,000 mg by mouth daily.  [provider] Past Week Unknown time Active Multiple Informants  omeprazole (PRILOSEC) 40 MG capsule [id number] No Take 40 mg by mouth daily as needed (Heart Burn).  [provider] Past Week Unknown time Active Multiple Informants  spironolactone (ALDACTONE) 25 MG tablet [id number] No Take 25 mg by mouth daily. [provider] Past Week Unknown time Active  Multiple Informants  tamsulosin (FLOMAX) 0.4 MG CAPS capsule [id number]  Take 1 capsule (0.4 mg total) by mouth daily. Drenda Freeze, MD  Active   warfarin (COUMADIN) 4 MG tablet [id number] No Take 2-4 mg by mouth daily. 2mg  on Tuesday and Thursday; Wednesday,Friday,Monday,Saturdya & Sunday 4mg  [provider] 02/26/2019 2100 Active Multiple Informants           Med Note Damita Dunnings, MACI D   Thu Feb 13, 2019 10:03 PM)            Assessment:  Drugs sorted by system:  Neurologic/Psychologic: Gabapentin, Sertraline  Cardiovascular: Eliquis, Atorvastatin, Atorvastatin Digoxin, Metoprolol Succinate   Gastrointestinal: Loperamide, Meclizine, Omeprazole, Polyethylene glycol  Endocrine: Levothyroxine  Pain: Acetaminophen, Hydrocodone/APAP,   Genitourinary: Tamsulosin  Vitamins/Minerals/Supplements: Astrid Drafts,   Miscellaneous:  Medication Review Findings:  . HgA1c- 5.7% 01/2019 o On statin-atorvastatin   Medication Assistance Findings:  Medication assistance needs identified: [place]  BMS requires patients to spend at least 3% of their income on medication expenses. It is unclear what the patient's medications out-of-pocket is. In addition, his caregiver said she needed some time to find his financial documents.  From what she said today, the patient's assets disqualify him from getting LIS/Extra Help.    Additional medication assistance options reviewed with patient as warranted:  No other options identified  Plan: Follow up in 2 weeks to see if patient's caregiver found his Social Security Benefits Letter.

## 2019-06-17 NOTE — Addendum Note (Signed)
Addended by: Elayne Guerin on: 06/17/2019 12:25 PM   Modules accepted: Orders

## 2019-06-18 DIAGNOSIS — M6289 Other specified disorders of muscle: Secondary | ICD-10-CM | POA: Diagnosis not present

## 2019-06-18 DIAGNOSIS — M6281 Muscle weakness (generalized): Secondary | ICD-10-CM | POA: Diagnosis not present

## 2019-06-18 DIAGNOSIS — M62838 Other muscle spasm: Secondary | ICD-10-CM | POA: Diagnosis not present

## 2019-06-24 DIAGNOSIS — I480 Paroxysmal atrial fibrillation: Secondary | ICD-10-CM | POA: Diagnosis not present

## 2019-06-24 DIAGNOSIS — I110 Hypertensive heart disease with heart failure: Secondary | ICD-10-CM | POA: Diagnosis not present

## 2019-06-24 DIAGNOSIS — E039 Hypothyroidism, unspecified: Secondary | ICD-10-CM | POA: Diagnosis not present

## 2019-06-24 DIAGNOSIS — N390 Urinary tract infection, site not specified: Secondary | ICD-10-CM | POA: Diagnosis not present

## 2019-06-24 DIAGNOSIS — I2510 Atherosclerotic heart disease of native coronary artery without angina pectoris: Secondary | ICD-10-CM | POA: Diagnosis not present

## 2019-06-24 DIAGNOSIS — M1990 Unspecified osteoarthritis, unspecified site: Secondary | ICD-10-CM | POA: Diagnosis not present

## 2019-06-24 DIAGNOSIS — E1165 Type 2 diabetes mellitus with hyperglycemia: Secondary | ICD-10-CM | POA: Diagnosis not present

## 2019-06-24 DIAGNOSIS — R339 Retention of urine, unspecified: Secondary | ICD-10-CM | POA: Diagnosis not present

## 2019-06-24 DIAGNOSIS — I420 Dilated cardiomyopathy: Secondary | ICD-10-CM | POA: Diagnosis not present

## 2019-06-24 DIAGNOSIS — B961 Klebsiella pneumoniae [K. pneumoniae] as the cause of diseases classified elsewhere: Secondary | ICD-10-CM | POA: Diagnosis not present

## 2019-06-24 DIAGNOSIS — I5022 Chronic systolic (congestive) heart failure: Secondary | ICD-10-CM | POA: Diagnosis not present

## 2019-06-24 DIAGNOSIS — N200 Calculus of kidney: Secondary | ICD-10-CM | POA: Diagnosis not present

## 2019-06-24 DIAGNOSIS — Z7901 Long term (current) use of anticoagulants: Secondary | ICD-10-CM | POA: Diagnosis not present

## 2019-06-24 DIAGNOSIS — Z95810 Presence of automatic (implantable) cardiac defibrillator: Secondary | ICD-10-CM | POA: Diagnosis not present

## 2019-06-24 DIAGNOSIS — K56609 Unspecified intestinal obstruction, unspecified as to partial versus complete obstruction: Secondary | ICD-10-CM | POA: Diagnosis not present

## 2019-06-24 DIAGNOSIS — I69320 Aphasia following cerebral infarction: Secondary | ICD-10-CM | POA: Diagnosis not present

## 2019-06-24 DIAGNOSIS — E1140 Type 2 diabetes mellitus with diabetic neuropathy, unspecified: Secondary | ICD-10-CM | POA: Diagnosis not present

## 2019-06-24 DIAGNOSIS — K219 Gastro-esophageal reflux disease without esophagitis: Secondary | ICD-10-CM | POA: Diagnosis not present

## 2019-06-24 DIAGNOSIS — Z602 Problems related to living alone: Secondary | ICD-10-CM | POA: Diagnosis not present

## 2019-06-24 DIAGNOSIS — E785 Hyperlipidemia, unspecified: Secondary | ICD-10-CM | POA: Diagnosis not present

## 2019-06-24 DIAGNOSIS — I252 Old myocardial infarction: Secondary | ICD-10-CM | POA: Diagnosis not present

## 2019-07-01 ENCOUNTER — Ambulatory Visit: Payer: Self-pay | Admitting: Pharmacist

## 2019-07-01 ENCOUNTER — Other Ambulatory Visit: Payer: Self-pay | Admitting: Pharmacist

## 2019-07-01 NOTE — Patient Outreach (Signed)
[place] [facility]) Care Management  07/01/2019  Mitchell Murray December 27, 1940 [id number]   Patient's emergency contact, Ilona Sorrel was called regarding medication assistance. Unfortunately, she did not answer the phone. HIPAA compliant message was left on her voicemail.  Plan: Call patient back in 3-4 weeks. Send unsuccessful outreach letter.  Elayne Guerin, PharmD, [place] Clinical Pharmacist [phone]

## 2019-07-02 DIAGNOSIS — Z7901 Long term (current) use of anticoagulants: Secondary | ICD-10-CM | POA: Diagnosis not present

## 2019-07-02 DIAGNOSIS — G252 Other specified forms of tremor: Secondary | ICD-10-CM | POA: Diagnosis not present

## 2019-07-02 DIAGNOSIS — I4891 Unspecified atrial fibrillation: Secondary | ICD-10-CM | POA: Diagnosis not present

## 2019-07-02 DIAGNOSIS — R251 Tremor, unspecified: Secondary | ICD-10-CM | POA: Diagnosis not present

## 2019-07-02 DIAGNOSIS — Z79899 Other long term (current) drug therapy: Secondary | ICD-10-CM | POA: Diagnosis not present

## 2019-07-10 DIAGNOSIS — M1990 Unspecified osteoarthritis, unspecified site: Secondary | ICD-10-CM | POA: Diagnosis not present

## 2019-07-10 DIAGNOSIS — I480 Paroxysmal atrial fibrillation: Secondary | ICD-10-CM | POA: Diagnosis not present

## 2019-07-10 DIAGNOSIS — Z95810 Presence of automatic (implantable) cardiac defibrillator: Secondary | ICD-10-CM | POA: Diagnosis not present

## 2019-07-10 DIAGNOSIS — E1140 Type 2 diabetes mellitus with diabetic neuropathy, unspecified: Secondary | ICD-10-CM | POA: Diagnosis not present

## 2019-07-10 DIAGNOSIS — I252 Old myocardial infarction: Secondary | ICD-10-CM | POA: Diagnosis not present

## 2019-07-10 DIAGNOSIS — E1165 Type 2 diabetes mellitus with hyperglycemia: Secondary | ICD-10-CM | POA: Diagnosis not present

## 2019-07-10 DIAGNOSIS — K56609 Unspecified intestinal obstruction, unspecified as to partial versus complete obstruction: Secondary | ICD-10-CM | POA: Diagnosis not present

## 2019-07-10 DIAGNOSIS — B961 Klebsiella pneumoniae [K. pneumoniae] as the cause of diseases classified elsewhere: Secondary | ICD-10-CM | POA: Diagnosis not present

## 2019-07-10 DIAGNOSIS — Z7901 Long term (current) use of anticoagulants: Secondary | ICD-10-CM | POA: Diagnosis not present

## 2019-07-10 DIAGNOSIS — I69320 Aphasia following cerebral infarction: Secondary | ICD-10-CM | POA: Diagnosis not present

## 2019-07-10 DIAGNOSIS — N200 Calculus of kidney: Secondary | ICD-10-CM | POA: Diagnosis not present

## 2019-07-10 DIAGNOSIS — I2510 Atherosclerotic heart disease of native coronary artery without angina pectoris: Secondary | ICD-10-CM | POA: Diagnosis not present

## 2019-07-10 DIAGNOSIS — E785 Hyperlipidemia, unspecified: Secondary | ICD-10-CM | POA: Diagnosis not present

## 2019-07-10 DIAGNOSIS — E039 Hypothyroidism, unspecified: Secondary | ICD-10-CM | POA: Diagnosis not present

## 2019-07-10 DIAGNOSIS — K219 Gastro-esophageal reflux disease without esophagitis: Secondary | ICD-10-CM | POA: Diagnosis not present

## 2019-07-10 DIAGNOSIS — Z602 Problems related to living alone: Secondary | ICD-10-CM | POA: Diagnosis not present

## 2019-07-10 DIAGNOSIS — N390 Urinary tract infection, site not specified: Secondary | ICD-10-CM | POA: Diagnosis not present

## 2019-07-10 DIAGNOSIS — I420 Dilated cardiomyopathy: Secondary | ICD-10-CM | POA: Diagnosis not present

## 2019-07-10 DIAGNOSIS — I110 Hypertensive heart disease with heart failure: Secondary | ICD-10-CM | POA: Diagnosis not present

## 2019-07-10 DIAGNOSIS — R339 Retention of urine, unspecified: Secondary | ICD-10-CM | POA: Diagnosis not present

## 2019-07-10 DIAGNOSIS — I5022 Chronic systolic (congestive) heart failure: Secondary | ICD-10-CM | POA: Diagnosis not present

## 2019-07-17 DIAGNOSIS — Z7901 Long term (current) use of anticoagulants: Secondary | ICD-10-CM | POA: Diagnosis not present

## 2019-07-17 DIAGNOSIS — I110 Hypertensive heart disease with heart failure: Secondary | ICD-10-CM | POA: Diagnosis not present

## 2019-07-17 DIAGNOSIS — K56609 Unspecified intestinal obstruction, unspecified as to partial versus complete obstruction: Secondary | ICD-10-CM | POA: Diagnosis not present

## 2019-07-17 DIAGNOSIS — E1165 Type 2 diabetes mellitus with hyperglycemia: Secondary | ICD-10-CM | POA: Diagnosis not present

## 2019-07-17 DIAGNOSIS — I480 Paroxysmal atrial fibrillation: Secondary | ICD-10-CM | POA: Diagnosis not present

## 2019-07-17 DIAGNOSIS — I252 Old myocardial infarction: Secondary | ICD-10-CM | POA: Diagnosis not present

## 2019-07-17 DIAGNOSIS — I69320 Aphasia following cerebral infarction: Secondary | ICD-10-CM | POA: Diagnosis not present

## 2019-07-17 DIAGNOSIS — M1990 Unspecified osteoarthritis, unspecified site: Secondary | ICD-10-CM | POA: Diagnosis not present

## 2019-07-17 DIAGNOSIS — K219 Gastro-esophageal reflux disease without esophagitis: Secondary | ICD-10-CM | POA: Diagnosis not present

## 2019-07-17 DIAGNOSIS — I5022 Chronic systolic (congestive) heart failure: Secondary | ICD-10-CM | POA: Diagnosis not present

## 2019-07-17 DIAGNOSIS — I2510 Atherosclerotic heart disease of native coronary artery without angina pectoris: Secondary | ICD-10-CM | POA: Diagnosis not present

## 2019-07-17 DIAGNOSIS — E1140 Type 2 diabetes mellitus with diabetic neuropathy, unspecified: Secondary | ICD-10-CM | POA: Diagnosis not present

## 2019-07-17 DIAGNOSIS — N200 Calculus of kidney: Secondary | ICD-10-CM | POA: Diagnosis not present

## 2019-07-17 DIAGNOSIS — R339 Retention of urine, unspecified: Secondary | ICD-10-CM | POA: Diagnosis not present

## 2019-07-17 DIAGNOSIS — Z95810 Presence of automatic (implantable) cardiac defibrillator: Secondary | ICD-10-CM | POA: Diagnosis not present

## 2019-07-17 DIAGNOSIS — Z602 Problems related to living alone: Secondary | ICD-10-CM | POA: Diagnosis not present

## 2019-07-17 DIAGNOSIS — E039 Hypothyroidism, unspecified: Secondary | ICD-10-CM | POA: Diagnosis not present

## 2019-07-17 DIAGNOSIS — E785 Hyperlipidemia, unspecified: Secondary | ICD-10-CM | POA: Diagnosis not present

## 2019-07-17 DIAGNOSIS — B961 Klebsiella pneumoniae [K. pneumoniae] as the cause of diseases classified elsewhere: Secondary | ICD-10-CM | POA: Diagnosis not present

## 2019-07-17 DIAGNOSIS — I420 Dilated cardiomyopathy: Secondary | ICD-10-CM | POA: Diagnosis not present

## 2019-07-17 DIAGNOSIS — N390 Urinary tract infection, site not specified: Secondary | ICD-10-CM | POA: Diagnosis not present

## 2019-07-22 DIAGNOSIS — N390 Urinary tract infection, site not specified: Secondary | ICD-10-CM | POA: Diagnosis not present

## 2019-07-22 DIAGNOSIS — I5022 Chronic systolic (congestive) heart failure: Secondary | ICD-10-CM | POA: Diagnosis not present

## 2019-07-22 DIAGNOSIS — I110 Hypertensive heart disease with heart failure: Secondary | ICD-10-CM | POA: Diagnosis not present

## 2019-07-22 DIAGNOSIS — I69320 Aphasia following cerebral infarction: Secondary | ICD-10-CM | POA: Diagnosis not present

## 2019-07-22 DIAGNOSIS — E785 Hyperlipidemia, unspecified: Secondary | ICD-10-CM | POA: Diagnosis not present

## 2019-07-22 DIAGNOSIS — M1990 Unspecified osteoarthritis, unspecified site: Secondary | ICD-10-CM | POA: Diagnosis not present

## 2019-07-22 DIAGNOSIS — Z7901 Long term (current) use of anticoagulants: Secondary | ICD-10-CM | POA: Diagnosis not present

## 2019-07-22 DIAGNOSIS — B961 Klebsiella pneumoniae [K. pneumoniae] as the cause of diseases classified elsewhere: Secondary | ICD-10-CM | POA: Diagnosis not present

## 2019-07-22 DIAGNOSIS — I420 Dilated cardiomyopathy: Secondary | ICD-10-CM | POA: Diagnosis not present

## 2019-07-22 DIAGNOSIS — R339 Retention of urine, unspecified: Secondary | ICD-10-CM | POA: Diagnosis not present

## 2019-07-22 DIAGNOSIS — I2510 Atherosclerotic heart disease of native coronary artery without angina pectoris: Secondary | ICD-10-CM | POA: Diagnosis not present

## 2019-07-22 DIAGNOSIS — I480 Paroxysmal atrial fibrillation: Secondary | ICD-10-CM | POA: Diagnosis not present

## 2019-07-22 DIAGNOSIS — K219 Gastro-esophageal reflux disease without esophagitis: Secondary | ICD-10-CM | POA: Diagnosis not present

## 2019-07-22 DIAGNOSIS — Z95810 Presence of automatic (implantable) cardiac defibrillator: Secondary | ICD-10-CM | POA: Diagnosis not present

## 2019-07-22 DIAGNOSIS — I252 Old myocardial infarction: Secondary | ICD-10-CM | POA: Diagnosis not present

## 2019-07-22 DIAGNOSIS — E039 Hypothyroidism, unspecified: Secondary | ICD-10-CM | POA: Diagnosis not present

## 2019-07-22 DIAGNOSIS — Z602 Problems related to living alone: Secondary | ICD-10-CM | POA: Diagnosis not present

## 2019-07-22 DIAGNOSIS — E1165 Type 2 diabetes mellitus with hyperglycemia: Secondary | ICD-10-CM | POA: Diagnosis not present

## 2019-07-22 DIAGNOSIS — K56609 Unspecified intestinal obstruction, unspecified as to partial versus complete obstruction: Secondary | ICD-10-CM | POA: Diagnosis not present

## 2019-07-22 DIAGNOSIS — E1140 Type 2 diabetes mellitus with diabetic neuropathy, unspecified: Secondary | ICD-10-CM | POA: Diagnosis not present

## 2019-07-22 DIAGNOSIS — N200 Calculus of kidney: Secondary | ICD-10-CM | POA: Diagnosis not present

## 2019-07-24 DIAGNOSIS — D649 Anemia, unspecified: Secondary | ICD-10-CM | POA: Diagnosis not present

## 2019-07-24 DIAGNOSIS — Z79899 Other long term (current) drug therapy: Secondary | ICD-10-CM | POA: Diagnosis not present

## 2019-07-24 DIAGNOSIS — E118 Type 2 diabetes mellitus with unspecified complications: Secondary | ICD-10-CM | POA: Diagnosis not present

## 2019-07-24 DIAGNOSIS — Z9181 History of falling: Secondary | ICD-10-CM | POA: Diagnosis not present

## 2019-07-24 DIAGNOSIS — I4891 Unspecified atrial fibrillation: Secondary | ICD-10-CM | POA: Diagnosis not present

## 2019-07-24 DIAGNOSIS — E063 Autoimmune thyroiditis: Secondary | ICD-10-CM | POA: Diagnosis not present

## 2019-07-24 DIAGNOSIS — I2510 Atherosclerotic heart disease of native coronary artery without angina pectoris: Secondary | ICD-10-CM | POA: Diagnosis not present

## 2019-07-30 DIAGNOSIS — I5022 Chronic systolic (congestive) heart failure: Secondary | ICD-10-CM | POA: Diagnosis not present

## 2019-07-30 DIAGNOSIS — I420 Dilated cardiomyopathy: Secondary | ICD-10-CM | POA: Diagnosis not present

## 2019-07-30 DIAGNOSIS — N200 Calculus of kidney: Secondary | ICD-10-CM | POA: Diagnosis not present

## 2019-07-30 DIAGNOSIS — E039 Hypothyroidism, unspecified: Secondary | ICD-10-CM | POA: Diagnosis not present

## 2019-07-30 DIAGNOSIS — K56609 Unspecified intestinal obstruction, unspecified as to partial versus complete obstruction: Secondary | ICD-10-CM | POA: Diagnosis not present

## 2019-07-30 DIAGNOSIS — I69320 Aphasia following cerebral infarction: Secondary | ICD-10-CM | POA: Diagnosis not present

## 2019-07-30 DIAGNOSIS — I480 Paroxysmal atrial fibrillation: Secondary | ICD-10-CM | POA: Diagnosis not present

## 2019-07-30 DIAGNOSIS — N390 Urinary tract infection, site not specified: Secondary | ICD-10-CM | POA: Diagnosis not present

## 2019-07-30 DIAGNOSIS — M1990 Unspecified osteoarthritis, unspecified site: Secondary | ICD-10-CM | POA: Diagnosis not present

## 2019-07-30 DIAGNOSIS — E785 Hyperlipidemia, unspecified: Secondary | ICD-10-CM | POA: Diagnosis not present

## 2019-07-30 DIAGNOSIS — Z602 Problems related to living alone: Secondary | ICD-10-CM | POA: Diagnosis not present

## 2019-07-30 DIAGNOSIS — E1165 Type 2 diabetes mellitus with hyperglycemia: Secondary | ICD-10-CM | POA: Diagnosis not present

## 2019-07-30 DIAGNOSIS — I110 Hypertensive heart disease with heart failure: Secondary | ICD-10-CM | POA: Diagnosis not present

## 2019-07-30 DIAGNOSIS — B961 Klebsiella pneumoniae [K. pneumoniae] as the cause of diseases classified elsewhere: Secondary | ICD-10-CM | POA: Diagnosis not present

## 2019-07-30 DIAGNOSIS — I252 Old myocardial infarction: Secondary | ICD-10-CM | POA: Diagnosis not present

## 2019-07-30 DIAGNOSIS — R339 Retention of urine, unspecified: Secondary | ICD-10-CM | POA: Diagnosis not present

## 2019-07-30 DIAGNOSIS — Z95810 Presence of automatic (implantable) cardiac defibrillator: Secondary | ICD-10-CM | POA: Diagnosis not present

## 2019-07-30 DIAGNOSIS — I2510 Atherosclerotic heart disease of native coronary artery without angina pectoris: Secondary | ICD-10-CM | POA: Diagnosis not present

## 2019-07-30 DIAGNOSIS — Z7901 Long term (current) use of anticoagulants: Secondary | ICD-10-CM | POA: Diagnosis not present

## 2019-07-30 DIAGNOSIS — E1140 Type 2 diabetes mellitus with diabetic neuropathy, unspecified: Secondary | ICD-10-CM | POA: Diagnosis not present

## 2019-07-30 DIAGNOSIS — K219 Gastro-esophageal reflux disease without esophagitis: Secondary | ICD-10-CM | POA: Diagnosis not present

## 2019-08-04 DIAGNOSIS — M62838 Other muscle spasm: Secondary | ICD-10-CM | POA: Diagnosis not present

## 2019-08-04 DIAGNOSIS — M6281 Muscle weakness (generalized): Secondary | ICD-10-CM | POA: Diagnosis not present

## 2019-08-04 DIAGNOSIS — M6289 Other specified disorders of muscle: Secondary | ICD-10-CM | POA: Diagnosis not present

## 2019-08-06 ENCOUNTER — Other Ambulatory Visit: Payer: Self-pay | Admitting: Cardiology

## 2019-08-07 DIAGNOSIS — E1165 Type 2 diabetes mellitus with hyperglycemia: Secondary | ICD-10-CM | POA: Diagnosis not present

## 2019-08-07 DIAGNOSIS — I2510 Atherosclerotic heart disease of native coronary artery without angina pectoris: Secondary | ICD-10-CM | POA: Diagnosis not present

## 2019-08-07 DIAGNOSIS — I69320 Aphasia following cerebral infarction: Secondary | ICD-10-CM | POA: Diagnosis not present

## 2019-08-07 DIAGNOSIS — M1990 Unspecified osteoarthritis, unspecified site: Secondary | ICD-10-CM | POA: Diagnosis not present

## 2019-08-07 DIAGNOSIS — I5022 Chronic systolic (congestive) heart failure: Secondary | ICD-10-CM | POA: Diagnosis not present

## 2019-08-07 DIAGNOSIS — I252 Old myocardial infarction: Secondary | ICD-10-CM | POA: Diagnosis not present

## 2019-08-07 DIAGNOSIS — I480 Paroxysmal atrial fibrillation: Secondary | ICD-10-CM | POA: Diagnosis not present

## 2019-08-07 DIAGNOSIS — E785 Hyperlipidemia, unspecified: Secondary | ICD-10-CM | POA: Diagnosis not present

## 2019-08-07 DIAGNOSIS — R339 Retention of urine, unspecified: Secondary | ICD-10-CM | POA: Diagnosis not present

## 2019-08-07 DIAGNOSIS — B961 Klebsiella pneumoniae [K. pneumoniae] as the cause of diseases classified elsewhere: Secondary | ICD-10-CM | POA: Diagnosis not present

## 2019-08-07 DIAGNOSIS — I110 Hypertensive heart disease with heart failure: Secondary | ICD-10-CM | POA: Diagnosis not present

## 2019-08-07 DIAGNOSIS — N200 Calculus of kidney: Secondary | ICD-10-CM | POA: Diagnosis not present

## 2019-08-07 DIAGNOSIS — E1140 Type 2 diabetes mellitus with diabetic neuropathy, unspecified: Secondary | ICD-10-CM | POA: Diagnosis not present

## 2019-08-07 DIAGNOSIS — K219 Gastro-esophageal reflux disease without esophagitis: Secondary | ICD-10-CM | POA: Diagnosis not present

## 2019-08-07 DIAGNOSIS — N390 Urinary tract infection, site not specified: Secondary | ICD-10-CM | POA: Diagnosis not present

## 2019-08-07 DIAGNOSIS — E039 Hypothyroidism, unspecified: Secondary | ICD-10-CM | POA: Diagnosis not present

## 2019-08-07 DIAGNOSIS — Z602 Problems related to living alone: Secondary | ICD-10-CM | POA: Diagnosis not present

## 2019-08-07 DIAGNOSIS — Z7901 Long term (current) use of anticoagulants: Secondary | ICD-10-CM | POA: Diagnosis not present

## 2019-08-07 DIAGNOSIS — I420 Dilated cardiomyopathy: Secondary | ICD-10-CM | POA: Diagnosis not present

## 2019-08-07 DIAGNOSIS — Z95810 Presence of automatic (implantable) cardiac defibrillator: Secondary | ICD-10-CM | POA: Diagnosis not present

## 2019-08-07 DIAGNOSIS — K56609 Unspecified intestinal obstruction, unspecified as to partial versus complete obstruction: Secondary | ICD-10-CM | POA: Diagnosis not present

## 2019-08-19 ENCOUNTER — Other Ambulatory Visit: Payer: Self-pay | Admitting: Pharmacist

## 2019-08-19 NOTE — Patient Outreach (Addendum)
[place] [facility])  [place] Team    [facility] pharmacy case will be closed as our team is transitioning from the [place] Management Department into the [facility] Quality Department and will no longer be using [facility] for documentation purposes.      Patient's caregiver was called to follow up on medication assistance. Financial paperwork is needed along with his out-of-pocket expenses.   Will gladly reopen the patient's case upon request.  Elayne Guerin, PharmD, [facility] Clinical Pharmacist [phone]  ADDENDUM  Patient's caregiver/sister, Ilona Sorrel, called me back. HIPAA identifiers were obtained. She said she had been sick and had not had an opportunity to get the financial documentation I requested. She said she would call me back once she accessed the information.   Elayne Guerin, PharmD, [place] Clinical Pharmacist [phone]

## 2019-08-29 ENCOUNTER — Ambulatory Visit: Payer: Self-pay | Admitting: Pharmacist

## 2019-09-16 DIAGNOSIS — K219 Gastro-esophageal reflux disease without esophagitis: Secondary | ICD-10-CM | POA: Diagnosis not present

## 2019-09-16 DIAGNOSIS — N200 Calculus of kidney: Secondary | ICD-10-CM | POA: Diagnosis not present

## 2019-09-16 DIAGNOSIS — Z79899 Other long term (current) drug therapy: Secondary | ICD-10-CM | POA: Diagnosis not present

## 2019-09-16 DIAGNOSIS — R21 Rash and other nonspecific skin eruption: Secondary | ICD-10-CM | POA: Diagnosis not present

## 2019-09-16 DIAGNOSIS — D649 Anemia, unspecified: Secondary | ICD-10-CM | POA: Diagnosis not present

## 2019-09-16 DIAGNOSIS — E063 Autoimmune thyroiditis: Secondary | ICD-10-CM | POA: Diagnosis not present

## 2019-09-30 DIAGNOSIS — L570 Actinic keratosis: Secondary | ICD-10-CM | POA: Diagnosis not present

## 2019-09-30 DIAGNOSIS — L820 Inflamed seborrheic keratosis: Secondary | ICD-10-CM | POA: Diagnosis not present

## 2019-09-30 DIAGNOSIS — L578 Other skin changes due to chronic exposure to nonionizing radiation: Secondary | ICD-10-CM | POA: Diagnosis not present

## 2019-10-02 DIAGNOSIS — D649 Anemia, unspecified: Secondary | ICD-10-CM | POA: Diagnosis not present

## 2019-10-02 DIAGNOSIS — R21 Rash and other nonspecific skin eruption: Secondary | ICD-10-CM | POA: Diagnosis not present

## 2019-10-06 IMAGING — NM NM DATSCAN
4 series · 31 of 31 positions shown · non-contrast
Comparison: PET-CT 08/05/2014

CLINICAL DATA: Tremors in the bilateral upper and lower
extremities.

EXAM:
NUCLEAR MEDICINE BRAIN IMAGING WITH SPECT  (DaTscan )
TECHNIQUE: SPECT images of the brain were obtained after intravenous injection
of radiopharmaceutical. 4 hour post injection imaging. Appropriate
positioning. 0.8 ml lugols solution administered in a.m
RADIOPHARMACEUTICALS:  0.8 millicuries I 123 Ioflupane

[Series 1: brain spect · 4.14mm/px · 6 of 120 frames shown]
[frame 11/120  full-range]
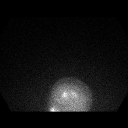
[frame 31/120  full-range]
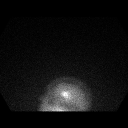
[frame 51/120  full-range]
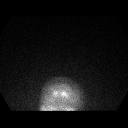
[frame 71/120  full-range]
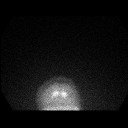
[frame 91/120  full-range]
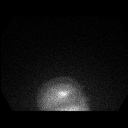
[frame 111/120  full-range]
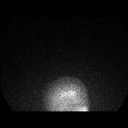

[Series 1: spect - (id) _(id)_tra · 4.1mm · 4.14mm/px · 6 of 128 frames shown]
[frame 11/128]
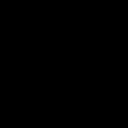
[frame 32/128]
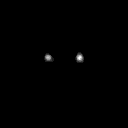
[frame 54/128]
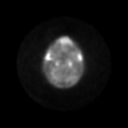
[frame 75/128]
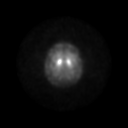
[frame 96/128]
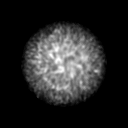
[frame 118/128]
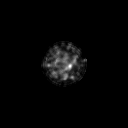

[Series 1: spect - (id) _(id)_cor · 4.1mm · 4.14mm/px · 6 of 128 frames shown]
[frame 11/128]
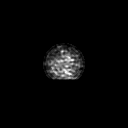
[frame 32/128]
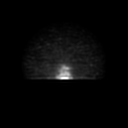
[frame 54/128]
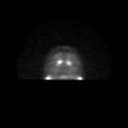
[frame 75/128]
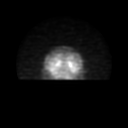
[frame 96/128]
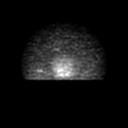
[frame 118/128]
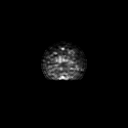

[Series 1023: results viewing · 1.04mm/px · 13 of 13 slices shown]
[im 1/13]
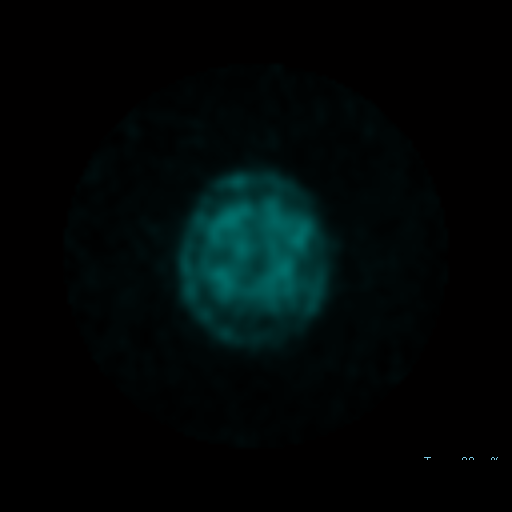
[im 2/13]
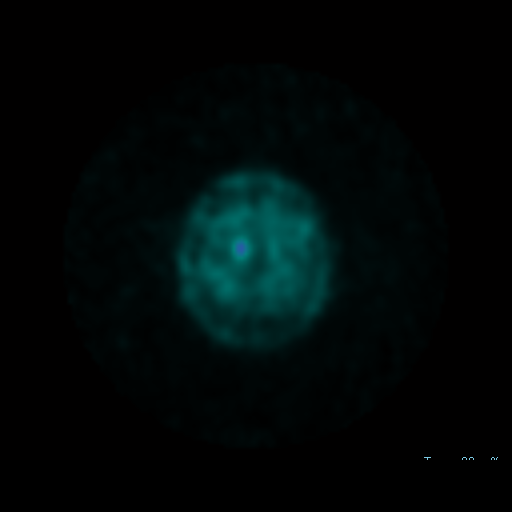
[im 3/13]
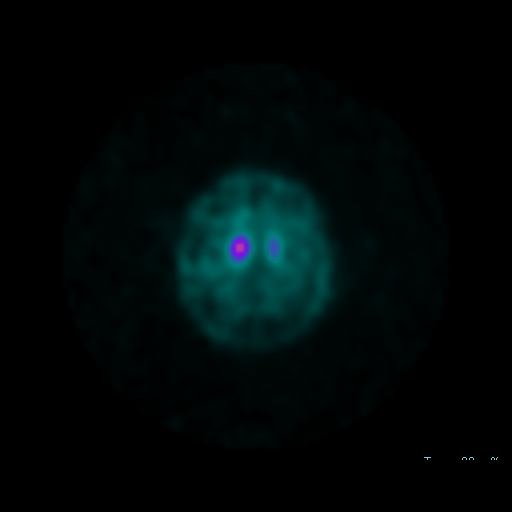
[im 4/13]
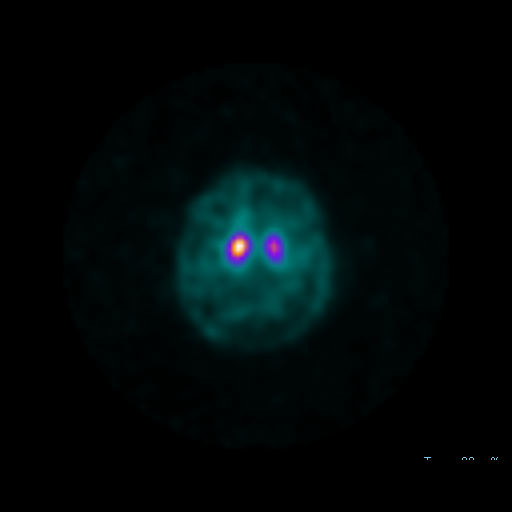
[im 5/13]
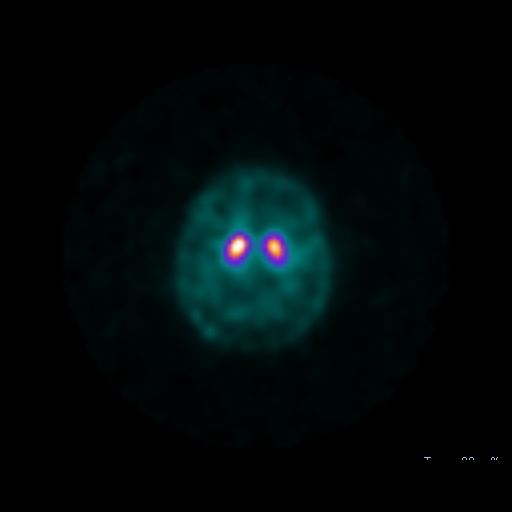
[im 6/13]
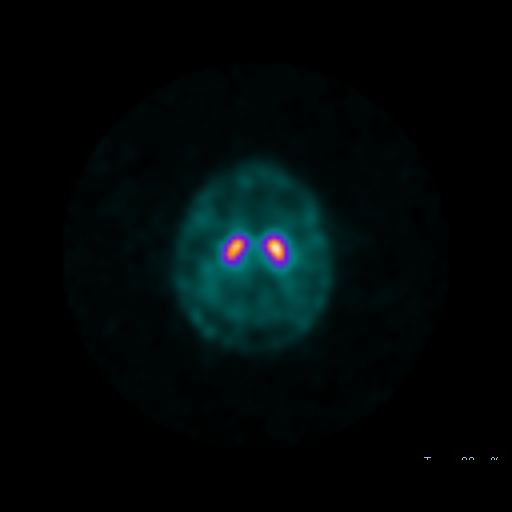
[im 7/13]
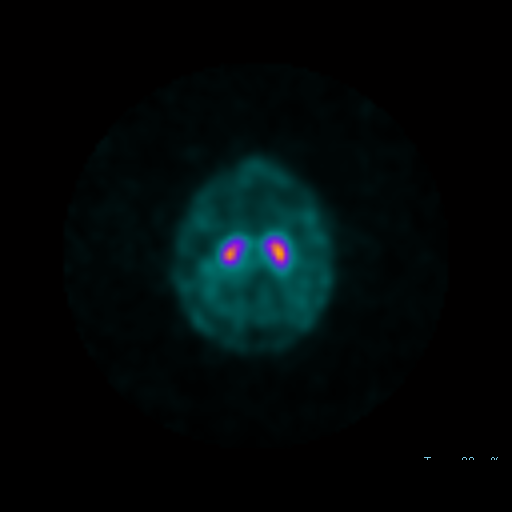
[im 8/13]
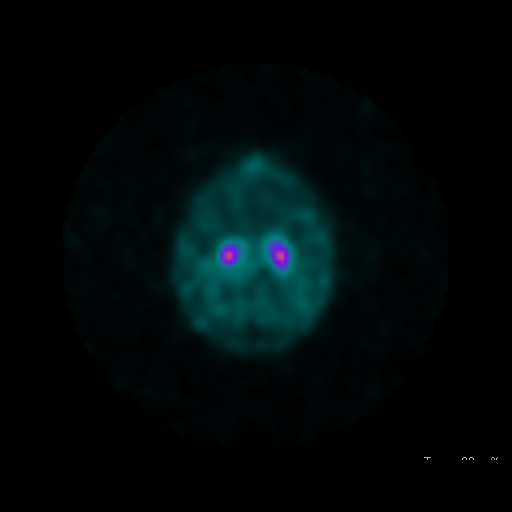
[im 9/13]
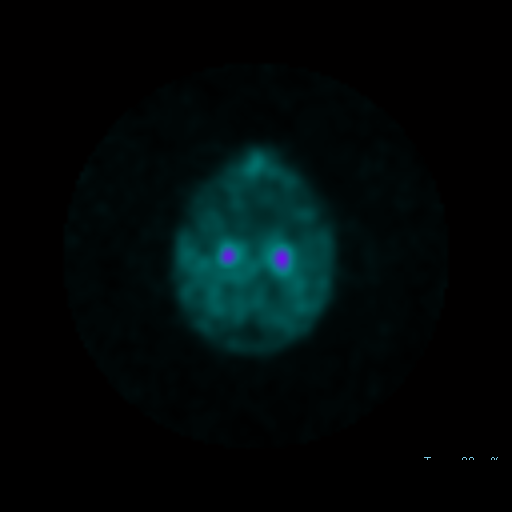
[im 10/13]
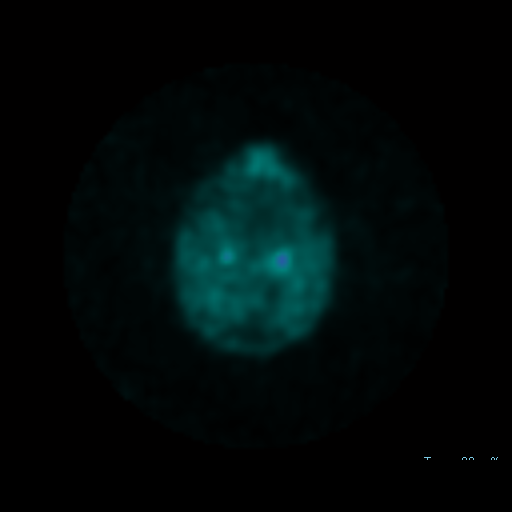
[im 11/13]
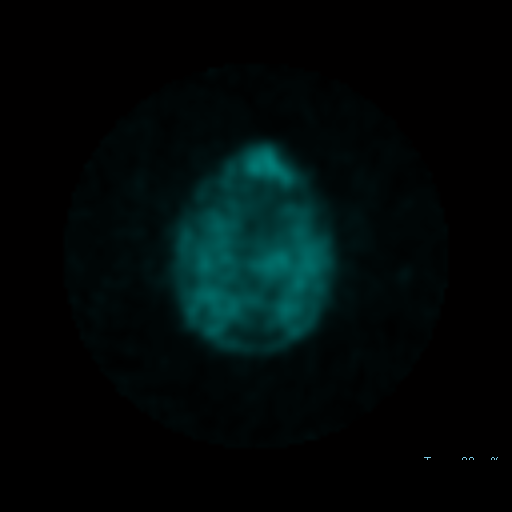
[im 12/13]
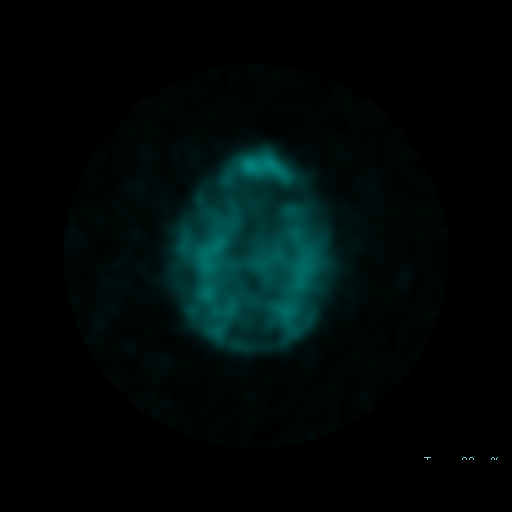
[im 13/13]
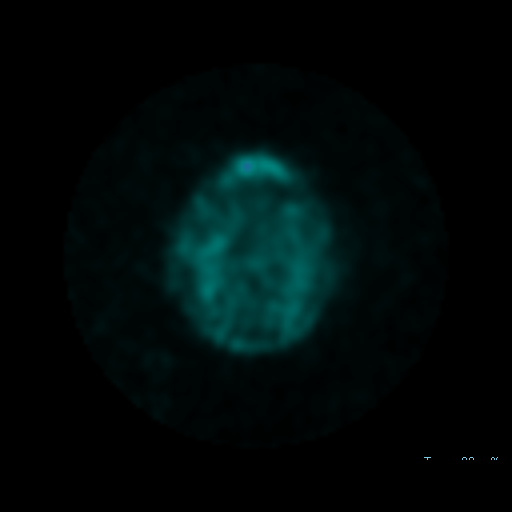

[31 of 31 positions shown; findings below may reference images not displayed]

FINDINGS: There is "coma" shaped activity within the LEFT and RIGHT basal
ganglia which is fairly symmetric. The dopamine transporter
radiotracer localizes to the putamen and head of the caudate in
typical normal distribution.
IMPRESSION: Typical normal radiotracer accumulation within both striata is NOT
consistent with a Parkinson's syndrome pattern.

## 2019-10-07 DIAGNOSIS — E119 Type 2 diabetes mellitus without complications: Secondary | ICD-10-CM | POA: Diagnosis not present

## 2019-10-07 DIAGNOSIS — H2513 Age-related nuclear cataract, bilateral: Secondary | ICD-10-CM | POA: Diagnosis not present

## 2019-10-14 DIAGNOSIS — J301 Allergic rhinitis due to pollen: Secondary | ICD-10-CM | POA: Diagnosis not present

## 2019-10-14 DIAGNOSIS — R238 Other skin changes: Secondary | ICD-10-CM | POA: Diagnosis not present

## 2019-10-24 DIAGNOSIS — I420 Dilated cardiomyopathy: Secondary | ICD-10-CM | POA: Diagnosis not present

## 2019-10-24 DIAGNOSIS — I4891 Unspecified atrial fibrillation: Secondary | ICD-10-CM | POA: Diagnosis not present

## 2019-10-24 DIAGNOSIS — E785 Hyperlipidemia, unspecified: Secondary | ICD-10-CM | POA: Diagnosis not present

## 2019-10-24 DIAGNOSIS — E118 Type 2 diabetes mellitus with unspecified complications: Secondary | ICD-10-CM | POA: Diagnosis not present

## 2019-10-24 DIAGNOSIS — E063 Autoimmune thyroiditis: Secondary | ICD-10-CM | POA: Diagnosis not present

## 2019-10-24 DIAGNOSIS — D649 Anemia, unspecified: Secondary | ICD-10-CM | POA: Diagnosis not present

## 2019-10-29 DIAGNOSIS — I10 Essential (primary) hypertension: Secondary | ICD-10-CM | POA: Diagnosis not present

## 2019-10-29 DIAGNOSIS — E039 Hypothyroidism, unspecified: Secondary | ICD-10-CM | POA: Diagnosis not present

## 2019-10-29 DIAGNOSIS — E119 Type 2 diabetes mellitus without complications: Secondary | ICD-10-CM | POA: Diagnosis not present

## 2019-10-29 DIAGNOSIS — D649 Anemia, unspecified: Secondary | ICD-10-CM | POA: Diagnosis not present

## 2019-10-29 DIAGNOSIS — I4891 Unspecified atrial fibrillation: Secondary | ICD-10-CM | POA: Diagnosis not present

## 2019-10-29 DIAGNOSIS — Z87442 Personal history of urinary calculi: Secondary | ICD-10-CM | POA: Diagnosis not present

## 2019-12-19 DIAGNOSIS — Z01818 Encounter for other preprocedural examination: Secondary | ICD-10-CM | POA: Diagnosis not present

## 2019-12-19 DIAGNOSIS — H25811 Combined forms of age-related cataract, right eye: Secondary | ICD-10-CM | POA: Diagnosis not present

## 2020-01-13 DIAGNOSIS — Z79899 Other long term (current) drug therapy: Secondary | ICD-10-CM | POA: Diagnosis not present

## 2020-01-13 DIAGNOSIS — H259 Unspecified age-related cataract: Secondary | ICD-10-CM | POA: Diagnosis not present

## 2020-01-13 DIAGNOSIS — M069 Rheumatoid arthritis, unspecified: Secondary | ICD-10-CM | POA: Diagnosis not present

## 2020-01-13 DIAGNOSIS — E1136 Type 2 diabetes mellitus with diabetic cataract: Secondary | ICD-10-CM | POA: Diagnosis not present

## 2020-01-13 DIAGNOSIS — H25811 Combined forms of age-related cataract, right eye: Secondary | ICD-10-CM | POA: Diagnosis not present

## 2020-01-13 DIAGNOSIS — E7800 Pure hypercholesterolemia, unspecified: Secondary | ICD-10-CM | POA: Diagnosis not present

## 2020-01-13 DIAGNOSIS — Z7984 Long term (current) use of oral hypoglycemic drugs: Secondary | ICD-10-CM | POA: Diagnosis not present

## 2020-01-13 DIAGNOSIS — Z950 Presence of cardiac pacemaker: Secondary | ICD-10-CM | POA: Diagnosis not present

## 2020-01-26 DIAGNOSIS — E118 Type 2 diabetes mellitus with unspecified complications: Secondary | ICD-10-CM | POA: Diagnosis not present

## 2020-01-26 DIAGNOSIS — K219 Gastro-esophageal reflux disease without esophagitis: Secondary | ICD-10-CM | POA: Diagnosis not present

## 2020-01-26 DIAGNOSIS — Z23 Encounter for immunization: Secondary | ICD-10-CM | POA: Diagnosis not present

## 2020-01-26 DIAGNOSIS — Z681 Body mass index (BMI) 19 or less, adult: Secondary | ICD-10-CM | POA: Diagnosis not present

## 2020-01-26 DIAGNOSIS — I4891 Unspecified atrial fibrillation: Secondary | ICD-10-CM | POA: Diagnosis not present

## 2020-01-26 DIAGNOSIS — I2510 Atherosclerotic heart disease of native coronary artery without angina pectoris: Secondary | ICD-10-CM | POA: Diagnosis not present

## 2020-01-26 DIAGNOSIS — E063 Autoimmune thyroiditis: Secondary | ICD-10-CM | POA: Diagnosis not present

## 2020-01-26 DIAGNOSIS — Z79899 Other long term (current) drug therapy: Secondary | ICD-10-CM | POA: Diagnosis not present

## 2020-01-26 DIAGNOSIS — E785 Hyperlipidemia, unspecified: Secondary | ICD-10-CM | POA: Diagnosis not present

## 2020-01-26 DIAGNOSIS — D649 Anemia, unspecified: Secondary | ICD-10-CM | POA: Diagnosis not present

## 2020-03-09 DIAGNOSIS — R601 Generalized edema: Secondary | ICD-10-CM | POA: Diagnosis not present

## 2020-03-09 DIAGNOSIS — I110 Hypertensive heart disease with heart failure: Secondary | ICD-10-CM | POA: Diagnosis not present

## 2020-03-09 DIAGNOSIS — N200 Calculus of kidney: Secondary | ICD-10-CM | POA: Diagnosis not present

## 2020-03-09 DIAGNOSIS — I501 Left ventricular failure: Secondary | ICD-10-CM | POA: Diagnosis not present

## 2020-03-09 DIAGNOSIS — R079 Chest pain, unspecified: Secondary | ICD-10-CM | POA: Diagnosis not present

## 2020-03-09 DIAGNOSIS — I700 Atherosclerosis of aorta: Secondary | ICD-10-CM | POA: Diagnosis not present

## 2020-03-09 DIAGNOSIS — J90 Pleural effusion, not elsewhere classified: Secondary | ICD-10-CM | POA: Diagnosis not present

## 2020-03-09 DIAGNOSIS — R319 Hematuria, unspecified: Secondary | ICD-10-CM | POA: Diagnosis not present

## 2020-03-09 DIAGNOSIS — I509 Heart failure, unspecified: Secondary | ICD-10-CM | POA: Diagnosis not present

## 2020-03-10 ENCOUNTER — Inpatient Hospital Stay: Payer: Medicare Other | Admitting: Oncology

## 2020-03-10 ENCOUNTER — Telehealth: Payer: Self-pay | Admitting: Oncology

## 2020-03-10 ENCOUNTER — Inpatient Hospital Stay: Payer: Medicare Other

## 2020-03-10 DIAGNOSIS — I509 Heart failure, unspecified: Secondary | ICD-10-CM | POA: Diagnosis not present

## 2020-03-10 DIAGNOSIS — I361 Nonrheumatic tricuspid (valve) insufficiency: Secondary | ICD-10-CM | POA: Diagnosis not present

## 2020-03-10 DIAGNOSIS — I252 Old myocardial infarction: Secondary | ICD-10-CM | POA: Diagnosis not present

## 2020-03-10 DIAGNOSIS — I700 Atherosclerosis of aorta: Secondary | ICD-10-CM | POA: Diagnosis not present

## 2020-03-10 DIAGNOSIS — Z7902 Long term (current) use of antithrombotics/antiplatelets: Secondary | ICD-10-CM | POA: Diagnosis not present

## 2020-03-10 DIAGNOSIS — Z8673 Personal history of transient ischemic attack (TIA), and cerebral infarction without residual deficits: Secondary | ICD-10-CM | POA: Diagnosis not present

## 2020-03-10 DIAGNOSIS — Z91041 Radiographic dye allergy status: Secondary | ICD-10-CM | POA: Diagnosis not present

## 2020-03-10 DIAGNOSIS — I2510 Atherosclerotic heart disease of native coronary artery without angina pectoris: Secondary | ICD-10-CM | POA: Diagnosis not present

## 2020-03-10 DIAGNOSIS — I420 Dilated cardiomyopathy: Secondary | ICD-10-CM | POA: Diagnosis not present

## 2020-03-10 DIAGNOSIS — I501 Left ventricular failure: Secondary | ICD-10-CM | POA: Diagnosis not present

## 2020-03-10 DIAGNOSIS — J90 Pleural effusion, not elsewhere classified: Secondary | ICD-10-CM | POA: Diagnosis not present

## 2020-03-10 DIAGNOSIS — Z79899 Other long term (current) drug therapy: Secondary | ICD-10-CM | POA: Diagnosis not present

## 2020-03-10 DIAGNOSIS — E039 Hypothyroidism, unspecified: Secondary | ICD-10-CM | POA: Diagnosis not present

## 2020-03-10 DIAGNOSIS — J449 Chronic obstructive pulmonary disease, unspecified: Secondary | ICD-10-CM | POA: Diagnosis not present

## 2020-03-10 DIAGNOSIS — I351 Nonrheumatic aortic (valve) insufficiency: Secondary | ICD-10-CM | POA: Diagnosis not present

## 2020-03-10 DIAGNOSIS — R079 Chest pain, unspecified: Secondary | ICD-10-CM | POA: Diagnosis not present

## 2020-03-10 DIAGNOSIS — I340 Nonrheumatic mitral (valve) insufficiency: Secondary | ICD-10-CM | POA: Diagnosis not present

## 2020-03-10 DIAGNOSIS — M1990 Unspecified osteoarthritis, unspecified site: Secondary | ICD-10-CM | POA: Diagnosis not present

## 2020-03-10 DIAGNOSIS — N390 Urinary tract infection, site not specified: Secondary | ICD-10-CM | POA: Diagnosis not present

## 2020-03-10 DIAGNOSIS — I5022 Chronic systolic (congestive) heart failure: Secondary | ICD-10-CM | POA: Diagnosis not present

## 2020-03-10 DIAGNOSIS — E440 Moderate protein-calorie malnutrition: Secondary | ICD-10-CM | POA: Diagnosis not present

## 2020-03-10 DIAGNOSIS — I472 Ventricular tachycardia: Secondary | ICD-10-CM | POA: Diagnosis not present

## 2020-03-10 DIAGNOSIS — R601 Generalized edema: Secondary | ICD-10-CM | POA: Diagnosis not present

## 2020-03-10 DIAGNOSIS — E876 Hypokalemia: Secondary | ICD-10-CM | POA: Diagnosis not present

## 2020-03-10 DIAGNOSIS — R319 Hematuria, unspecified: Secondary | ICD-10-CM | POA: Diagnosis not present

## 2020-03-10 DIAGNOSIS — I110 Hypertensive heart disease with heart failure: Secondary | ICD-10-CM | POA: Diagnosis not present

## 2020-03-10 DIAGNOSIS — N200 Calculus of kidney: Secondary | ICD-10-CM | POA: Diagnosis not present

## 2020-03-10 DIAGNOSIS — I5023 Acute on chronic systolic (congestive) heart failure: Secondary | ICD-10-CM | POA: Diagnosis not present

## 2020-03-10 DIAGNOSIS — Z950 Presence of cardiac pacemaker: Secondary | ICD-10-CM | POA: Diagnosis not present

## 2020-03-10 DIAGNOSIS — Z23 Encounter for immunization: Secondary | ICD-10-CM | POA: Diagnosis not present

## 2020-03-10 DIAGNOSIS — E1165 Type 2 diabetes mellitus with hyperglycemia: Secondary | ICD-10-CM | POA: Diagnosis not present

## 2020-03-10 NOTE — Telephone Encounter (Signed)
Patient's sister Left Message that he is Inpatient @ [facility]

## 2020-03-11 DIAGNOSIS — I5023 Acute on chronic systolic (congestive) heart failure: Secondary | ICD-10-CM | POA: Diagnosis not present

## 2020-03-11 DIAGNOSIS — I351 Nonrheumatic aortic (valve) insufficiency: Secondary | ICD-10-CM | POA: Diagnosis not present

## 2020-03-11 DIAGNOSIS — R319 Hematuria, unspecified: Secondary | ICD-10-CM | POA: Diagnosis not present

## 2020-03-11 DIAGNOSIS — I420 Dilated cardiomyopathy: Secondary | ICD-10-CM | POA: Diagnosis not present

## 2020-03-11 DIAGNOSIS — I5022 Chronic systolic (congestive) heart failure: Secondary | ICD-10-CM | POA: Diagnosis not present

## 2020-03-12 DIAGNOSIS — I5022 Chronic systolic (congestive) heart failure: Secondary | ICD-10-CM | POA: Diagnosis not present

## 2020-03-12 DIAGNOSIS — I420 Dilated cardiomyopathy: Secondary | ICD-10-CM | POA: Diagnosis not present

## 2020-03-12 DIAGNOSIS — R319 Hematuria, unspecified: Secondary | ICD-10-CM | POA: Diagnosis not present

## 2020-03-18 DIAGNOSIS — E118 Type 2 diabetes mellitus with unspecified complications: Secondary | ICD-10-CM | POA: Diagnosis not present

## 2020-03-18 DIAGNOSIS — E063 Autoimmune thyroiditis: Secondary | ICD-10-CM | POA: Diagnosis not present

## 2020-03-18 DIAGNOSIS — Z79899 Other long term (current) drug therapy: Secondary | ICD-10-CM | POA: Diagnosis not present

## 2020-03-18 DIAGNOSIS — I509 Heart failure, unspecified: Secondary | ICD-10-CM | POA: Diagnosis not present

## 2020-03-18 DIAGNOSIS — Z681 Body mass index (BMI) 19 or less, adult: Secondary | ICD-10-CM | POA: Diagnosis not present

## 2020-03-19 DIAGNOSIS — Z01818 Encounter for other preprocedural examination: Secondary | ICD-10-CM | POA: Diagnosis not present

## 2020-03-19 DIAGNOSIS — E119 Type 2 diabetes mellitus without complications: Secondary | ICD-10-CM | POA: Diagnosis not present

## 2020-03-19 DIAGNOSIS — H25812 Combined forms of age-related cataract, left eye: Secondary | ICD-10-CM | POA: Diagnosis not present

## 2020-03-22 DIAGNOSIS — R310 Gross hematuria: Secondary | ICD-10-CM | POA: Diagnosis not present

## 2020-03-22 DIAGNOSIS — Z87442 Personal history of urinary calculi: Secondary | ICD-10-CM | POA: Diagnosis not present

## 2020-03-24 DIAGNOSIS — I420 Dilated cardiomyopathy: Secondary | ICD-10-CM | POA: Diagnosis not present

## 2020-03-24 DIAGNOSIS — I2510 Atherosclerotic heart disease of native coronary artery without angina pectoris: Secondary | ICD-10-CM | POA: Diagnosis not present

## 2020-03-24 DIAGNOSIS — I5022 Chronic systolic (congestive) heart failure: Secondary | ICD-10-CM | POA: Diagnosis not present

## 2020-03-24 DIAGNOSIS — Z8673 Personal history of transient ischemic attack (TIA), and cerebral infarction without residual deficits: Secondary | ICD-10-CM | POA: Diagnosis not present

## 2020-03-24 DIAGNOSIS — Z95810 Presence of automatic (implantable) cardiac defibrillator: Secondary | ICD-10-CM | POA: Diagnosis not present

## 2020-03-30 DIAGNOSIS — H25812 Combined forms of age-related cataract, left eye: Secondary | ICD-10-CM | POA: Diagnosis not present

## 2020-03-30 DIAGNOSIS — E039 Hypothyroidism, unspecified: Secondary | ICD-10-CM | POA: Diagnosis not present

## 2020-03-30 DIAGNOSIS — M069 Rheumatoid arthritis, unspecified: Secondary | ICD-10-CM | POA: Diagnosis not present

## 2020-03-30 DIAGNOSIS — I509 Heart failure, unspecified: Secondary | ICD-10-CM | POA: Diagnosis not present

## 2020-03-30 DIAGNOSIS — H259 Unspecified age-related cataract: Secondary | ICD-10-CM | POA: Diagnosis not present

## 2020-03-30 DIAGNOSIS — H40013 Open angle with borderline findings, low risk, bilateral: Secondary | ICD-10-CM | POA: Diagnosis not present

## 2020-03-30 DIAGNOSIS — I110 Hypertensive heart disease with heart failure: Secondary | ICD-10-CM | POA: Diagnosis not present

## 2020-03-30 DIAGNOSIS — Z950 Presence of cardiac pacemaker: Secondary | ICD-10-CM | POA: Diagnosis not present

## 2020-03-30 DIAGNOSIS — Z7901 Long term (current) use of anticoagulants: Secondary | ICD-10-CM | POA: Diagnosis not present

## 2020-03-30 DIAGNOSIS — Z7984 Long term (current) use of oral hypoglycemic drugs: Secondary | ICD-10-CM | POA: Diagnosis not present

## 2020-03-30 DIAGNOSIS — Z8709 Personal history of other diseases of the respiratory system: Secondary | ICD-10-CM | POA: Diagnosis not present

## 2020-03-30 DIAGNOSIS — Z8673 Personal history of transient ischemic attack (TIA), and cerebral infarction without residual deficits: Secondary | ICD-10-CM | POA: Diagnosis not present

## 2020-03-30 DIAGNOSIS — I252 Old myocardial infarction: Secondary | ICD-10-CM | POA: Diagnosis not present

## 2020-03-30 DIAGNOSIS — E1136 Type 2 diabetes mellitus with diabetic cataract: Secondary | ICD-10-CM | POA: Diagnosis not present

## 2020-03-30 DIAGNOSIS — J449 Chronic obstructive pulmonary disease, unspecified: Secondary | ICD-10-CM | POA: Diagnosis not present

## 2020-03-30 DIAGNOSIS — K219 Gastro-esophageal reflux disease without esophagitis: Secondary | ICD-10-CM | POA: Diagnosis not present

## 2020-03-30 DIAGNOSIS — I10 Essential (primary) hypertension: Secondary | ICD-10-CM | POA: Diagnosis not present

## 2020-04-12 DIAGNOSIS — N200 Calculus of kidney: Secondary | ICD-10-CM | POA: Diagnosis not present

## 2020-04-12 DIAGNOSIS — K56609 Unspecified intestinal obstruction, unspecified as to partial versus complete obstruction: Secondary | ICD-10-CM | POA: Diagnosis not present

## 2020-04-12 DIAGNOSIS — K6389 Other specified diseases of intestine: Secondary | ICD-10-CM | POA: Diagnosis not present

## 2020-04-12 DIAGNOSIS — R39198 Other difficulties with micturition: Secondary | ICD-10-CM | POA: Diagnosis not present

## 2020-04-12 DIAGNOSIS — N139 Obstructive and reflux uropathy, unspecified: Secondary | ICD-10-CM | POA: Diagnosis not present

## 2020-04-13 DIAGNOSIS — R109 Unspecified abdominal pain: Secondary | ICD-10-CM | POA: Diagnosis not present

## 2020-04-13 DIAGNOSIS — Z8719 Personal history of other diseases of the digestive system: Secondary | ICD-10-CM | POA: Diagnosis not present

## 2020-04-13 DIAGNOSIS — I480 Paroxysmal atrial fibrillation: Secondary | ICD-10-CM | POA: Diagnosis not present

## 2020-04-13 DIAGNOSIS — R39198 Other difficulties with micturition: Secondary | ICD-10-CM | POA: Diagnosis not present

## 2020-04-13 DIAGNOSIS — K5651 Intestinal adhesions [bands], with partial obstruction: Secondary | ICD-10-CM | POA: Diagnosis not present

## 2020-04-13 DIAGNOSIS — K219 Gastro-esophageal reflux disease without esophagitis: Secondary | ICD-10-CM | POA: Diagnosis not present

## 2020-04-13 DIAGNOSIS — Z4682 Encounter for fitting and adjustment of non-vascular catheter: Secondary | ICD-10-CM | POA: Diagnosis not present

## 2020-04-13 DIAGNOSIS — I5022 Chronic systolic (congestive) heart failure: Secondary | ICD-10-CM | POA: Diagnosis not present

## 2020-04-13 DIAGNOSIS — Z862 Personal history of diseases of the blood and blood-forming organs and certain disorders involving the immune mechanism: Secondary | ICD-10-CM | POA: Diagnosis not present

## 2020-04-13 DIAGNOSIS — Z8673 Personal history of transient ischemic attack (TIA), and cerebral infarction without residual deficits: Secondary | ICD-10-CM | POA: Diagnosis not present

## 2020-04-13 DIAGNOSIS — I420 Dilated cardiomyopathy: Secondary | ICD-10-CM | POA: Diagnosis not present

## 2020-04-13 DIAGNOSIS — I110 Hypertensive heart disease with heart failure: Secondary | ICD-10-CM | POA: Diagnosis not present

## 2020-04-13 DIAGNOSIS — J449 Chronic obstructive pulmonary disease, unspecified: Secondary | ICD-10-CM | POA: Diagnosis not present

## 2020-04-13 DIAGNOSIS — Z7901 Long term (current) use of anticoagulants: Secondary | ICD-10-CM | POA: Diagnosis not present

## 2020-04-13 DIAGNOSIS — E119 Type 2 diabetes mellitus without complications: Secondary | ICD-10-CM | POA: Diagnosis not present

## 2020-04-13 DIAGNOSIS — Z7984 Long term (current) use of oral hypoglycemic drugs: Secondary | ICD-10-CM | POA: Diagnosis not present

## 2020-04-13 DIAGNOSIS — I252 Old myocardial infarction: Secondary | ICD-10-CM | POA: Diagnosis not present

## 2020-04-13 DIAGNOSIS — Z950 Presence of cardiac pacemaker: Secondary | ICD-10-CM | POA: Diagnosis not present

## 2020-04-13 DIAGNOSIS — E43 Unspecified severe protein-calorie malnutrition: Secondary | ICD-10-CM | POA: Diagnosis not present

## 2020-04-13 DIAGNOSIS — K56609 Unspecified intestinal obstruction, unspecified as to partial versus complete obstruction: Secondary | ICD-10-CM | POA: Diagnosis not present

## 2020-04-13 DIAGNOSIS — I2510 Atherosclerotic heart disease of native coronary artery without angina pectoris: Secondary | ICD-10-CM | POA: Diagnosis not present

## 2020-04-13 DIAGNOSIS — M069 Rheumatoid arthritis, unspecified: Secondary | ICD-10-CM | POA: Diagnosis not present

## 2020-04-13 DIAGNOSIS — N200 Calculus of kidney: Secondary | ICD-10-CM | POA: Diagnosis not present

## 2020-04-13 DIAGNOSIS — K6389 Other specified diseases of intestine: Secondary | ICD-10-CM | POA: Diagnosis not present

## 2020-04-13 DIAGNOSIS — R143 Flatulence: Secondary | ICD-10-CM | POA: Diagnosis not present

## 2020-04-13 DIAGNOSIS — Z681 Body mass index (BMI) 19 or less, adult: Secondary | ICD-10-CM | POA: Diagnosis not present

## 2020-04-13 DIAGNOSIS — E039 Hypothyroidism, unspecified: Secondary | ICD-10-CM | POA: Diagnosis not present

## 2020-04-13 DIAGNOSIS — N139 Obstructive and reflux uropathy, unspecified: Secondary | ICD-10-CM | POA: Diagnosis not present

## 2020-04-26 DIAGNOSIS — Z79899 Other long term (current) drug therapy: Secondary | ICD-10-CM | POA: Diagnosis not present

## 2020-04-26 DIAGNOSIS — E118 Type 2 diabetes mellitus with unspecified complications: Secondary | ICD-10-CM | POA: Diagnosis not present

## 2020-04-26 DIAGNOSIS — D649 Anemia, unspecified: Secondary | ICD-10-CM | POA: Diagnosis not present

## 2020-04-26 DIAGNOSIS — I4891 Unspecified atrial fibrillation: Secondary | ICD-10-CM | POA: Diagnosis not present

## 2020-04-26 DIAGNOSIS — I2510 Atherosclerotic heart disease of native coronary artery without angina pectoris: Secondary | ICD-10-CM | POA: Diagnosis not present

## 2020-04-26 DIAGNOSIS — E063 Autoimmune thyroiditis: Secondary | ICD-10-CM | POA: Diagnosis not present

## 2020-04-26 DIAGNOSIS — Z681 Body mass index (BMI) 19 or less, adult: Secondary | ICD-10-CM | POA: Diagnosis not present

## 2020-04-26 DIAGNOSIS — E785 Hyperlipidemia, unspecified: Secondary | ICD-10-CM | POA: Diagnosis not present

## 2020-05-03 DIAGNOSIS — R310 Gross hematuria: Secondary | ICD-10-CM | POA: Diagnosis not present

## 2020-05-03 DIAGNOSIS — N35013 Post-traumatic anterior urethral stricture: Secondary | ICD-10-CM | POA: Diagnosis not present

## 2020-05-07 DIAGNOSIS — I110 Hypertensive heart disease with heart failure: Secondary | ICD-10-CM | POA: Diagnosis not present

## 2020-05-07 DIAGNOSIS — Z4502 Encounter for adjustment and management of automatic implantable cardiac defibrillator: Secondary | ICD-10-CM | POA: Diagnosis not present

## 2020-05-07 DIAGNOSIS — Z9229 Personal history of other drug therapy: Secondary | ICD-10-CM | POA: Diagnosis not present

## 2020-05-07 DIAGNOSIS — I420 Dilated cardiomyopathy: Secondary | ICD-10-CM | POA: Diagnosis not present

## 2020-05-07 DIAGNOSIS — I5022 Chronic systolic (congestive) heart failure: Secondary | ICD-10-CM | POA: Diagnosis not present

## 2020-05-07 DIAGNOSIS — I480 Paroxysmal atrial fibrillation: Secondary | ICD-10-CM | POA: Diagnosis not present

## 2020-05-28 DIAGNOSIS — E785 Hyperlipidemia, unspecified: Secondary | ICD-10-CM | POA: Diagnosis not present

## 2020-05-28 DIAGNOSIS — Z95810 Presence of automatic (implantable) cardiac defibrillator: Secondary | ICD-10-CM | POA: Diagnosis not present

## 2020-05-28 DIAGNOSIS — I2510 Atherosclerotic heart disease of native coronary artery without angina pectoris: Secondary | ICD-10-CM | POA: Diagnosis not present

## 2020-05-28 DIAGNOSIS — I5022 Chronic systolic (congestive) heart failure: Secondary | ICD-10-CM | POA: Diagnosis not present

## 2020-05-28 DIAGNOSIS — I480 Paroxysmal atrial fibrillation: Secondary | ICD-10-CM | POA: Diagnosis not present

## 2020-05-28 DIAGNOSIS — I639 Cerebral infarction, unspecified: Secondary | ICD-10-CM | POA: Diagnosis not present

## 2020-06-01 DIAGNOSIS — I252 Old myocardial infarction: Secondary | ICD-10-CM | POA: Diagnosis not present

## 2020-06-01 DIAGNOSIS — Z87891 Personal history of nicotine dependence: Secondary | ICD-10-CM | POA: Diagnosis not present

## 2020-06-01 DIAGNOSIS — I480 Paroxysmal atrial fibrillation: Secondary | ICD-10-CM | POA: Diagnosis not present

## 2020-06-01 DIAGNOSIS — E119 Type 2 diabetes mellitus without complications: Secondary | ICD-10-CM | POA: Diagnosis not present

## 2020-06-01 DIAGNOSIS — Z950 Presence of cardiac pacemaker: Secondary | ICD-10-CM | POA: Diagnosis not present

## 2020-06-01 DIAGNOSIS — I2510 Atherosclerotic heart disease of native coronary artery without angina pectoris: Secondary | ICD-10-CM | POA: Diagnosis not present

## 2020-06-01 DIAGNOSIS — Z01812 Encounter for preprocedural laboratory examination: Secondary | ICD-10-CM | POA: Diagnosis not present

## 2020-06-07 DIAGNOSIS — Z4502 Encounter for adjustment and management of automatic implantable cardiac defibrillator: Secondary | ICD-10-CM | POA: Diagnosis not present

## 2020-06-07 DIAGNOSIS — T82111A Breakdown (mechanical) of cardiac pulse generator (battery), initial encounter: Secondary | ICD-10-CM | POA: Diagnosis not present

## 2020-06-07 DIAGNOSIS — I5022 Chronic systolic (congestive) heart failure: Secondary | ICD-10-CM | POA: Diagnosis not present

## 2020-06-07 DIAGNOSIS — Z8673 Personal history of transient ischemic attack (TIA), and cerebral infarction without residual deficits: Secondary | ICD-10-CM | POA: Diagnosis not present

## 2020-06-07 DIAGNOSIS — I428 Other cardiomyopathies: Secondary | ICD-10-CM | POA: Diagnosis not present

## 2020-06-07 DIAGNOSIS — Z87891 Personal history of nicotine dependence: Secondary | ICD-10-CM | POA: Diagnosis not present

## 2020-06-07 DIAGNOSIS — I480 Paroxysmal atrial fibrillation: Secondary | ICD-10-CM | POA: Diagnosis not present

## 2020-06-07 DIAGNOSIS — I110 Hypertensive heart disease with heart failure: Secondary | ICD-10-CM | POA: Diagnosis not present

## 2020-06-07 DIAGNOSIS — I2510 Atherosclerotic heart disease of native coronary artery without angina pectoris: Secondary | ICD-10-CM | POA: Diagnosis not present

## 2020-06-07 DIAGNOSIS — I4819 Other persistent atrial fibrillation: Secondary | ICD-10-CM | POA: Diagnosis not present

## 2020-06-18 DIAGNOSIS — Z4502 Encounter for adjustment and management of automatic implantable cardiac defibrillator: Secondary | ICD-10-CM | POA: Diagnosis not present

## 2020-06-18 DIAGNOSIS — I5022 Chronic systolic (congestive) heart failure: Secondary | ICD-10-CM | POA: Diagnosis not present

## 2020-07-28 DIAGNOSIS — M545 Low back pain, unspecified: Secondary | ICD-10-CM | POA: Diagnosis not present

## 2020-07-28 DIAGNOSIS — E118 Type 2 diabetes mellitus with unspecified complications: Secondary | ICD-10-CM | POA: Diagnosis not present

## 2020-07-28 DIAGNOSIS — I4891 Unspecified atrial fibrillation: Secondary | ICD-10-CM | POA: Diagnosis not present

## 2020-07-28 DIAGNOSIS — D649 Anemia, unspecified: Secondary | ICD-10-CM | POA: Diagnosis not present

## 2020-07-28 DIAGNOSIS — K219 Gastro-esophageal reflux disease without esophagitis: Secondary | ICD-10-CM | POA: Diagnosis not present

## 2020-07-28 DIAGNOSIS — I420 Dilated cardiomyopathy: Secondary | ICD-10-CM | POA: Diagnosis not present

## 2020-07-28 DIAGNOSIS — I2510 Atherosclerotic heart disease of native coronary artery without angina pectoris: Secondary | ICD-10-CM | POA: Diagnosis not present

## 2020-07-28 DIAGNOSIS — Z681 Body mass index (BMI) 19 or less, adult: Secondary | ICD-10-CM | POA: Diagnosis not present

## 2020-07-28 DIAGNOSIS — E063 Autoimmune thyroiditis: Secondary | ICD-10-CM | POA: Diagnosis not present

## 2020-07-28 DIAGNOSIS — E785 Hyperlipidemia, unspecified: Secondary | ICD-10-CM | POA: Diagnosis not present

## 2020-09-24 DIAGNOSIS — I5022 Chronic systolic (congestive) heart failure: Secondary | ICD-10-CM | POA: Diagnosis not present

## 2020-10-28 DIAGNOSIS — K219 Gastro-esophageal reflux disease without esophagitis: Secondary | ICD-10-CM | POA: Diagnosis not present

## 2020-10-28 DIAGNOSIS — E785 Hyperlipidemia, unspecified: Secondary | ICD-10-CM | POA: Diagnosis not present

## 2020-10-28 DIAGNOSIS — I4891 Unspecified atrial fibrillation: Secondary | ICD-10-CM | POA: Diagnosis not present

## 2020-10-28 DIAGNOSIS — I420 Dilated cardiomyopathy: Secondary | ICD-10-CM | POA: Diagnosis not present

## 2020-10-28 DIAGNOSIS — I2510 Atherosclerotic heart disease of native coronary artery without angina pectoris: Secondary | ICD-10-CM | POA: Diagnosis not present

## 2020-10-28 DIAGNOSIS — Z681 Body mass index (BMI) 19 or less, adult: Secondary | ICD-10-CM | POA: Diagnosis not present

## 2020-10-28 DIAGNOSIS — D649 Anemia, unspecified: Secondary | ICD-10-CM | POA: Diagnosis not present

## 2020-10-28 DIAGNOSIS — E063 Autoimmune thyroiditis: Secondary | ICD-10-CM | POA: Diagnosis not present

## 2020-10-28 DIAGNOSIS — I700 Atherosclerosis of aorta: Secondary | ICD-10-CM | POA: Diagnosis not present

## 2020-10-28 DIAGNOSIS — E118 Type 2 diabetes mellitus with unspecified complications: Secondary | ICD-10-CM | POA: Diagnosis not present

## 2020-12-17 DIAGNOSIS — Z4502 Encounter for adjustment and management of automatic implantable cardiac defibrillator: Secondary | ICD-10-CM | POA: Diagnosis not present

## 2021-05-04 DIAGNOSIS — I5022 Chronic systolic (congestive) heart failure: Secondary | ICD-10-CM | POA: Diagnosis not present

## 2021-05-04 DIAGNOSIS — I428 Other cardiomyopathies: Secondary | ICD-10-CM | POA: Diagnosis not present

## 2021-05-04 DIAGNOSIS — R079 Chest pain, unspecified: Secondary | ICD-10-CM | POA: Diagnosis not present

## 2021-05-04 DIAGNOSIS — I110 Hypertensive heart disease with heart failure: Secondary | ICD-10-CM | POA: Diagnosis not present

## 2021-05-04 DIAGNOSIS — R778 Other specified abnormalities of plasma proteins: Secondary | ICD-10-CM | POA: Diagnosis not present

## 2021-05-04 DIAGNOSIS — E039 Hypothyroidism, unspecified: Secondary | ICD-10-CM | POA: Diagnosis not present

## 2021-05-04 DIAGNOSIS — Z7901 Long term (current) use of anticoagulants: Secondary | ICD-10-CM | POA: Diagnosis not present

## 2021-05-04 DIAGNOSIS — J90 Pleural effusion, not elsewhere classified: Secondary | ICD-10-CM | POA: Diagnosis not present

## 2021-05-04 DIAGNOSIS — R072 Precordial pain: Secondary | ICD-10-CM | POA: Diagnosis not present

## 2021-05-04 DIAGNOSIS — I420 Dilated cardiomyopathy: Secondary | ICD-10-CM | POA: Diagnosis not present

## 2021-05-04 DIAGNOSIS — R0789 Other chest pain: Secondary | ICD-10-CM | POA: Diagnosis not present

## 2021-05-04 DIAGNOSIS — I482 Chronic atrial fibrillation, unspecified: Secondary | ICD-10-CM | POA: Diagnosis not present

## 2021-05-04 DIAGNOSIS — I2510 Atherosclerotic heart disease of native coronary artery without angina pectoris: Secondary | ICD-10-CM | POA: Diagnosis not present

## 2021-05-04 DIAGNOSIS — Z8673 Personal history of transient ischemic attack (TIA), and cerebral infarction without residual deficits: Secondary | ICD-10-CM | POA: Diagnosis not present

## 2021-05-04 DIAGNOSIS — M25511 Pain in right shoulder: Secondary | ICD-10-CM | POA: Diagnosis not present

## 2021-05-04 DIAGNOSIS — M79601 Pain in right arm: Secondary | ICD-10-CM | POA: Diagnosis not present

## 2021-05-04 DIAGNOSIS — Z950 Presence of cardiac pacemaker: Secondary | ICD-10-CM | POA: Diagnosis not present

## 2021-05-05 DIAGNOSIS — I428 Other cardiomyopathies: Secondary | ICD-10-CM | POA: Diagnosis not present

## 2021-05-05 DIAGNOSIS — R7989 Other specified abnormal findings of blood chemistry: Secondary | ICD-10-CM | POA: Diagnosis not present

## 2021-05-05 DIAGNOSIS — Z8673 Personal history of transient ischemic attack (TIA), and cerebral infarction without residual deficits: Secondary | ICD-10-CM | POA: Diagnosis not present

## 2021-05-05 DIAGNOSIS — I482 Chronic atrial fibrillation, unspecified: Secondary | ICD-10-CM | POA: Diagnosis not present

## 2021-05-05 DIAGNOSIS — I10 Essential (primary) hypertension: Secondary | ICD-10-CM | POA: Diagnosis not present

## 2021-05-05 DIAGNOSIS — I5022 Chronic systolic (congestive) heart failure: Secondary | ICD-10-CM | POA: Diagnosis not present

## 2021-05-05 DIAGNOSIS — Z950 Presence of cardiac pacemaker: Secondary | ICD-10-CM | POA: Diagnosis not present

## 2021-05-05 DIAGNOSIS — Z7901 Long term (current) use of anticoagulants: Secondary | ICD-10-CM | POA: Diagnosis not present

## 2021-05-05 DIAGNOSIS — E119 Type 2 diabetes mellitus without complications: Secondary | ICD-10-CM | POA: Diagnosis not present

## 2021-05-05 DIAGNOSIS — I4891 Unspecified atrial fibrillation: Secondary | ICD-10-CM | POA: Diagnosis not present

## 2021-05-05 DIAGNOSIS — R0789 Other chest pain: Secondary | ICD-10-CM | POA: Diagnosis not present

## 2021-05-05 DIAGNOSIS — E039 Hypothyroidism, unspecified: Secondary | ICD-10-CM | POA: Diagnosis not present

## 2021-05-12 DIAGNOSIS — I420 Dilated cardiomyopathy: Secondary | ICD-10-CM | POA: Diagnosis not present

## 2021-05-12 DIAGNOSIS — I5022 Chronic systolic (congestive) heart failure: Secondary | ICD-10-CM | POA: Diagnosis not present

## 2021-05-12 DIAGNOSIS — Z79899 Other long term (current) drug therapy: Secondary | ICD-10-CM | POA: Diagnosis not present

## 2021-05-12 DIAGNOSIS — Z4502 Encounter for adjustment and management of automatic implantable cardiac defibrillator: Secondary | ICD-10-CM | POA: Diagnosis not present

## 2021-06-08 DIAGNOSIS — M25511 Pain in right shoulder: Secondary | ICD-10-CM | POA: Diagnosis not present

## 2021-06-08 DIAGNOSIS — G8929 Other chronic pain: Secondary | ICD-10-CM | POA: Diagnosis not present

## 2021-06-08 DIAGNOSIS — R569 Unspecified convulsions: Secondary | ICD-10-CM | POA: Diagnosis not present

## 2021-06-09 DIAGNOSIS — I959 Hypotension, unspecified: Secondary | ICD-10-CM | POA: Diagnosis not present

## 2021-06-09 DIAGNOSIS — I5022 Chronic systolic (congestive) heart failure: Secondary | ICD-10-CM | POA: Diagnosis not present

## 2021-06-09 DIAGNOSIS — R0609 Other forms of dyspnea: Secondary | ICD-10-CM | POA: Diagnosis not present

## 2021-06-09 DIAGNOSIS — Z79899 Other long term (current) drug therapy: Secondary | ICD-10-CM | POA: Diagnosis not present

## 2021-06-09 DIAGNOSIS — R0602 Shortness of breath: Secondary | ICD-10-CM | POA: Diagnosis not present

## 2021-06-09 DIAGNOSIS — R0600 Dyspnea, unspecified: Secondary | ICD-10-CM | POA: Diagnosis not present

## 2021-06-09 DIAGNOSIS — E039 Hypothyroidism, unspecified: Secondary | ICD-10-CM | POA: Diagnosis not present

## 2021-06-16 DIAGNOSIS — R0602 Shortness of breath: Secondary | ICD-10-CM | POA: Diagnosis not present

## 2021-06-20 DIAGNOSIS — I110 Hypertensive heart disease with heart failure: Secondary | ICD-10-CM | POA: Diagnosis not present

## 2021-06-20 DIAGNOSIS — I428 Other cardiomyopathies: Secondary | ICD-10-CM | POA: Diagnosis not present

## 2021-06-20 DIAGNOSIS — Z7901 Long term (current) use of anticoagulants: Secondary | ICD-10-CM | POA: Diagnosis not present

## 2021-06-20 DIAGNOSIS — I2510 Atherosclerotic heart disease of native coronary artery without angina pectoris: Secondary | ICD-10-CM | POA: Diagnosis not present

## 2021-06-20 DIAGNOSIS — Z95810 Presence of automatic (implantable) cardiac defibrillator: Secondary | ICD-10-CM | POA: Diagnosis not present

## 2021-06-20 DIAGNOSIS — Z8673 Personal history of transient ischemic attack (TIA), and cerebral infarction without residual deficits: Secondary | ICD-10-CM | POA: Diagnosis not present

## 2021-06-20 DIAGNOSIS — I5022 Chronic systolic (congestive) heart failure: Secondary | ICD-10-CM | POA: Diagnosis not present

## 2021-06-20 DIAGNOSIS — I4819 Other persistent atrial fibrillation: Secondary | ICD-10-CM | POA: Diagnosis not present

## 2021-06-22 DIAGNOSIS — Z136 Encounter for screening for cardiovascular disorders: Secondary | ICD-10-CM | POA: Diagnosis not present

## 2021-06-22 DIAGNOSIS — Z0000 Encounter for general adult medical examination without abnormal findings: Secondary | ICD-10-CM | POA: Diagnosis not present

## 2021-06-22 DIAGNOSIS — Z139 Encounter for screening, unspecified: Secondary | ICD-10-CM | POA: Diagnosis not present

## 2021-06-22 DIAGNOSIS — E039 Hypothyroidism, unspecified: Secondary | ICD-10-CM | POA: Diagnosis not present

## 2021-06-22 DIAGNOSIS — I5022 Chronic systolic (congestive) heart failure: Secondary | ICD-10-CM | POA: Diagnosis not present

## 2021-06-22 DIAGNOSIS — Z1331 Encounter for screening for depression: Secondary | ICD-10-CM | POA: Diagnosis not present

## 2021-06-22 DIAGNOSIS — R0602 Shortness of breath: Secondary | ICD-10-CM | POA: Diagnosis not present

## 2021-06-22 DIAGNOSIS — Z681 Body mass index (BMI) 19 or less, adult: Secondary | ICD-10-CM | POA: Diagnosis not present

## 2021-06-27 IMAGING — DX DG ABDOMEN ACUTE W/ 1V CHEST
3 series · 3 of 3 positions shown · non-contrast
Comparison: 12/15/2013, 01/21/2018, CT 02/13/2019

CLINICAL DATA: Bowel obstruction

EXAM:
DG ABDOMEN ACUTE W/ 1V CHEST

[abdomen kub]
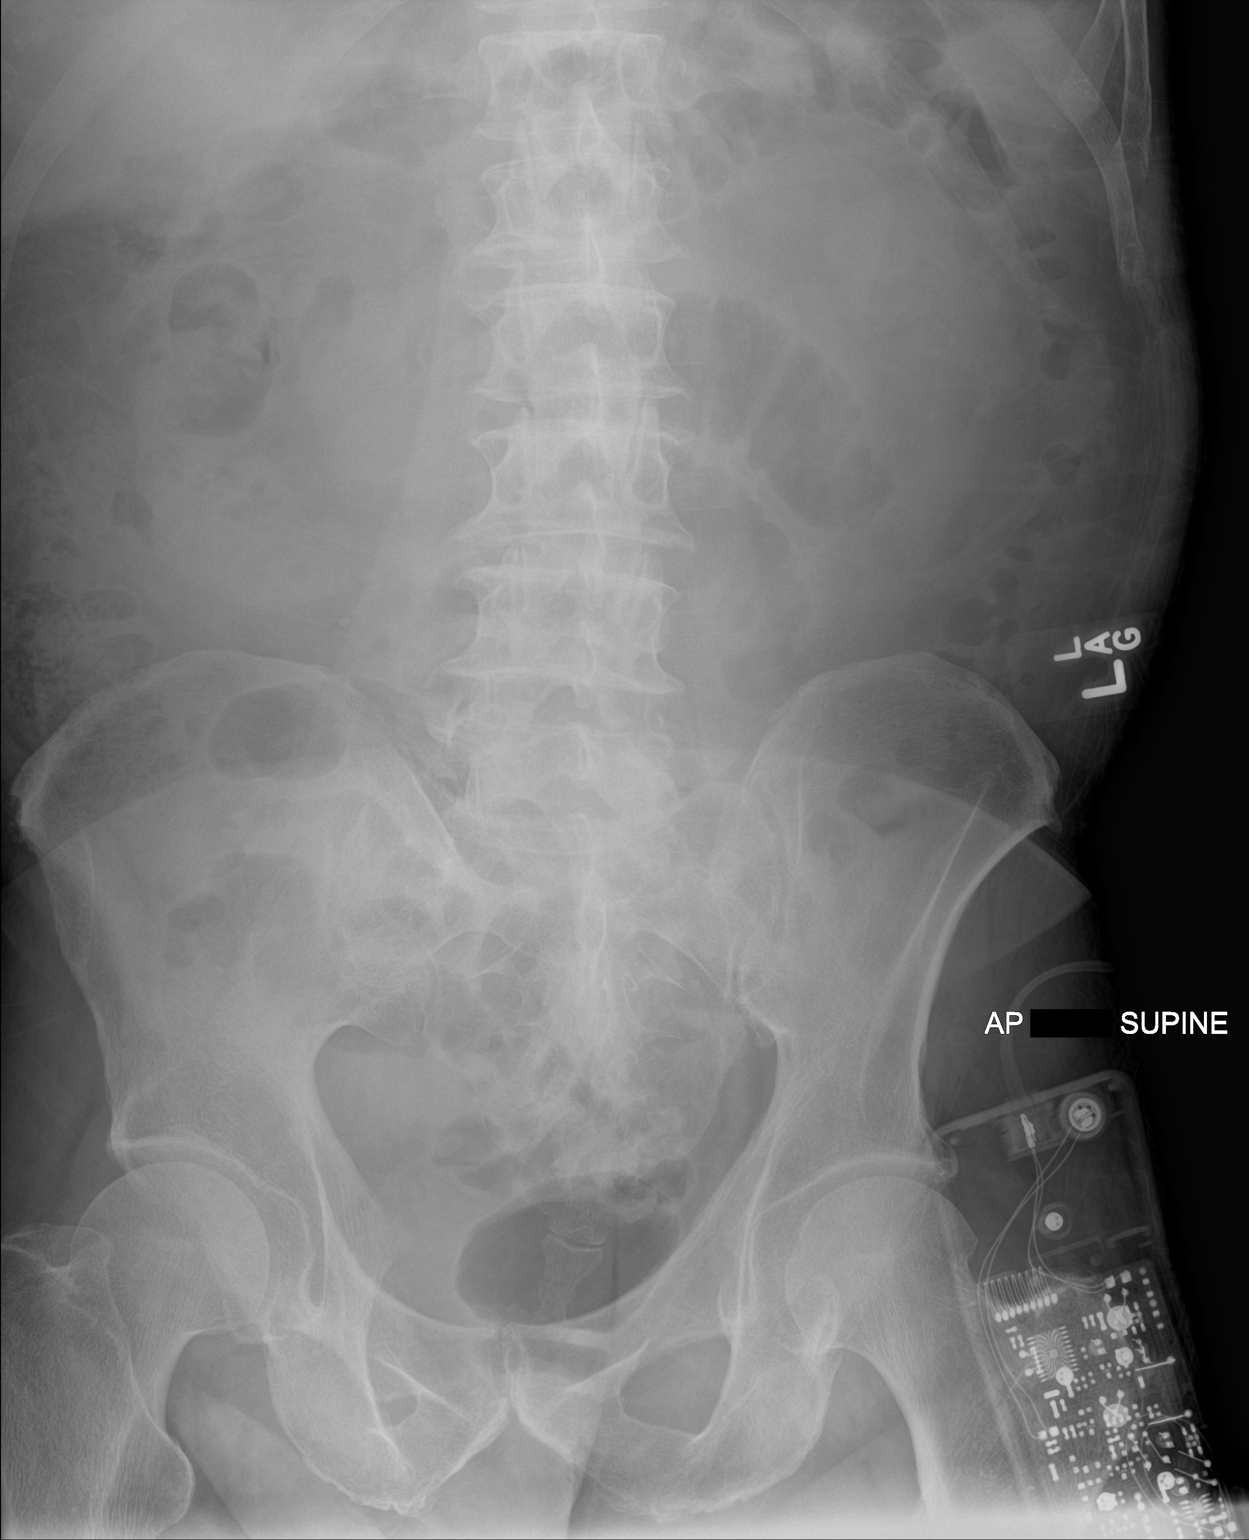

[chest pa]
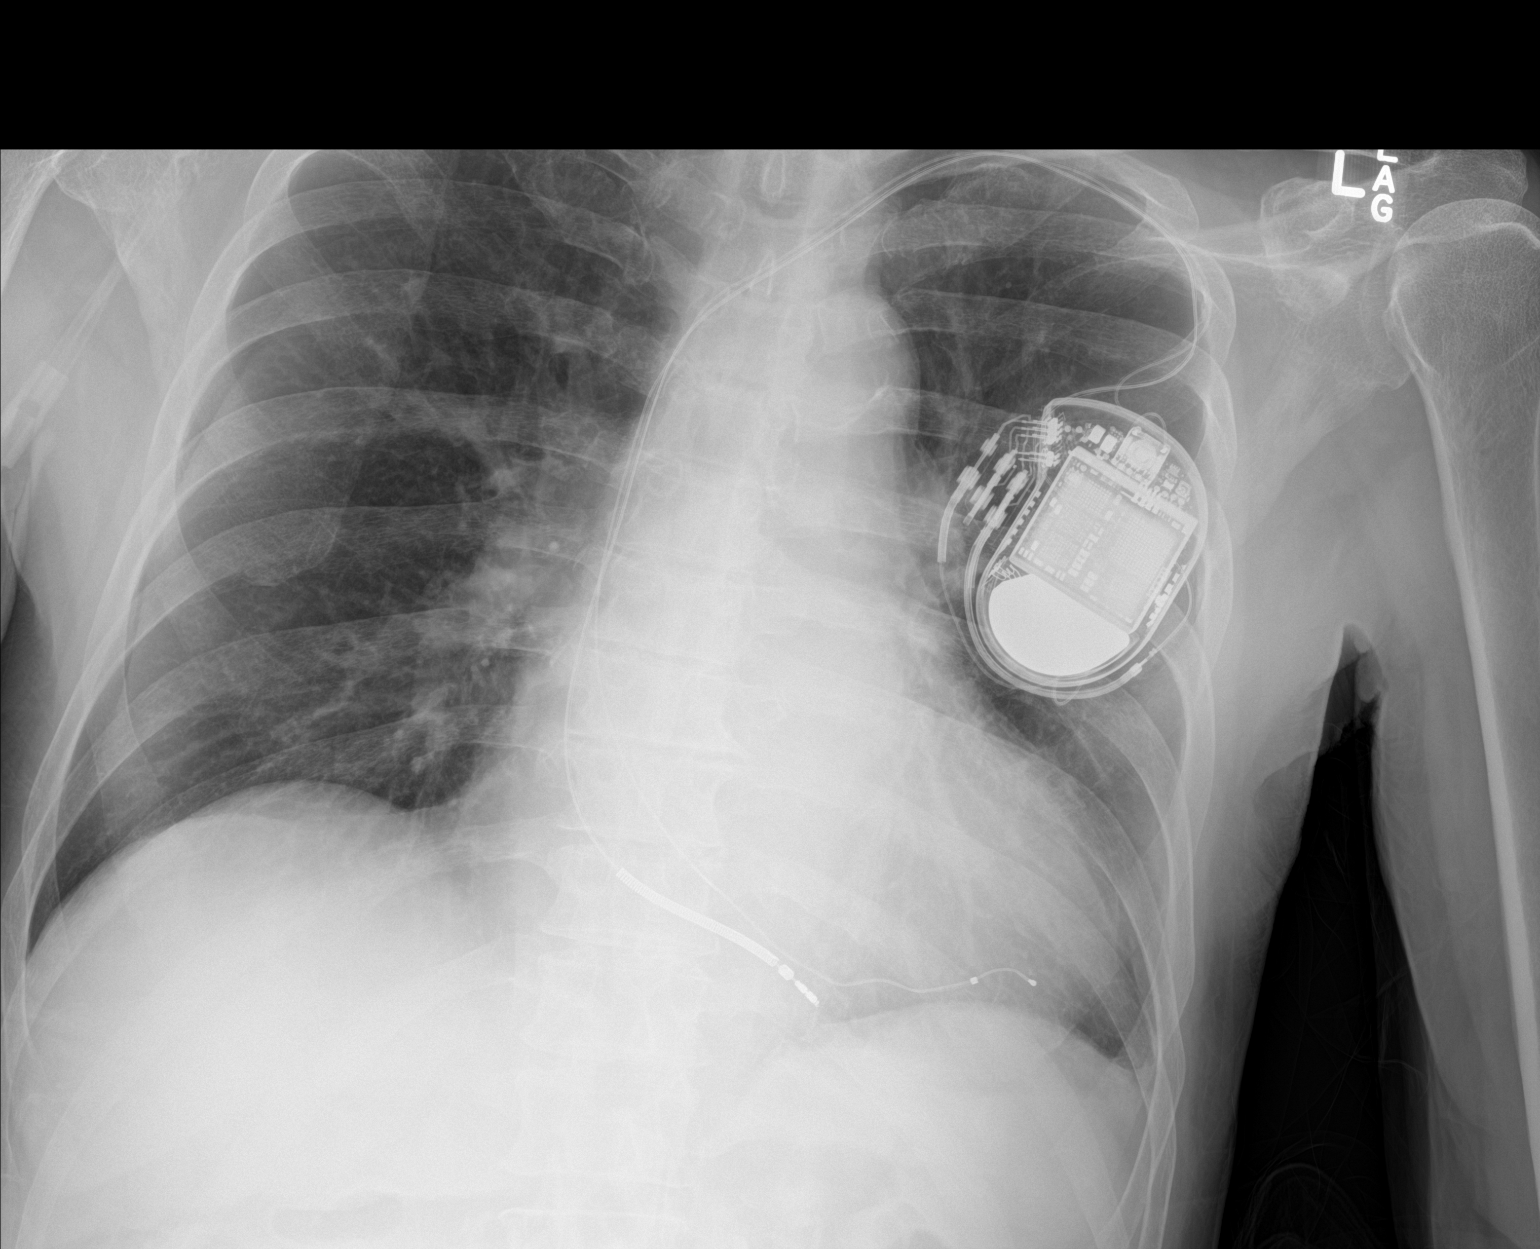

[abdomen erect]
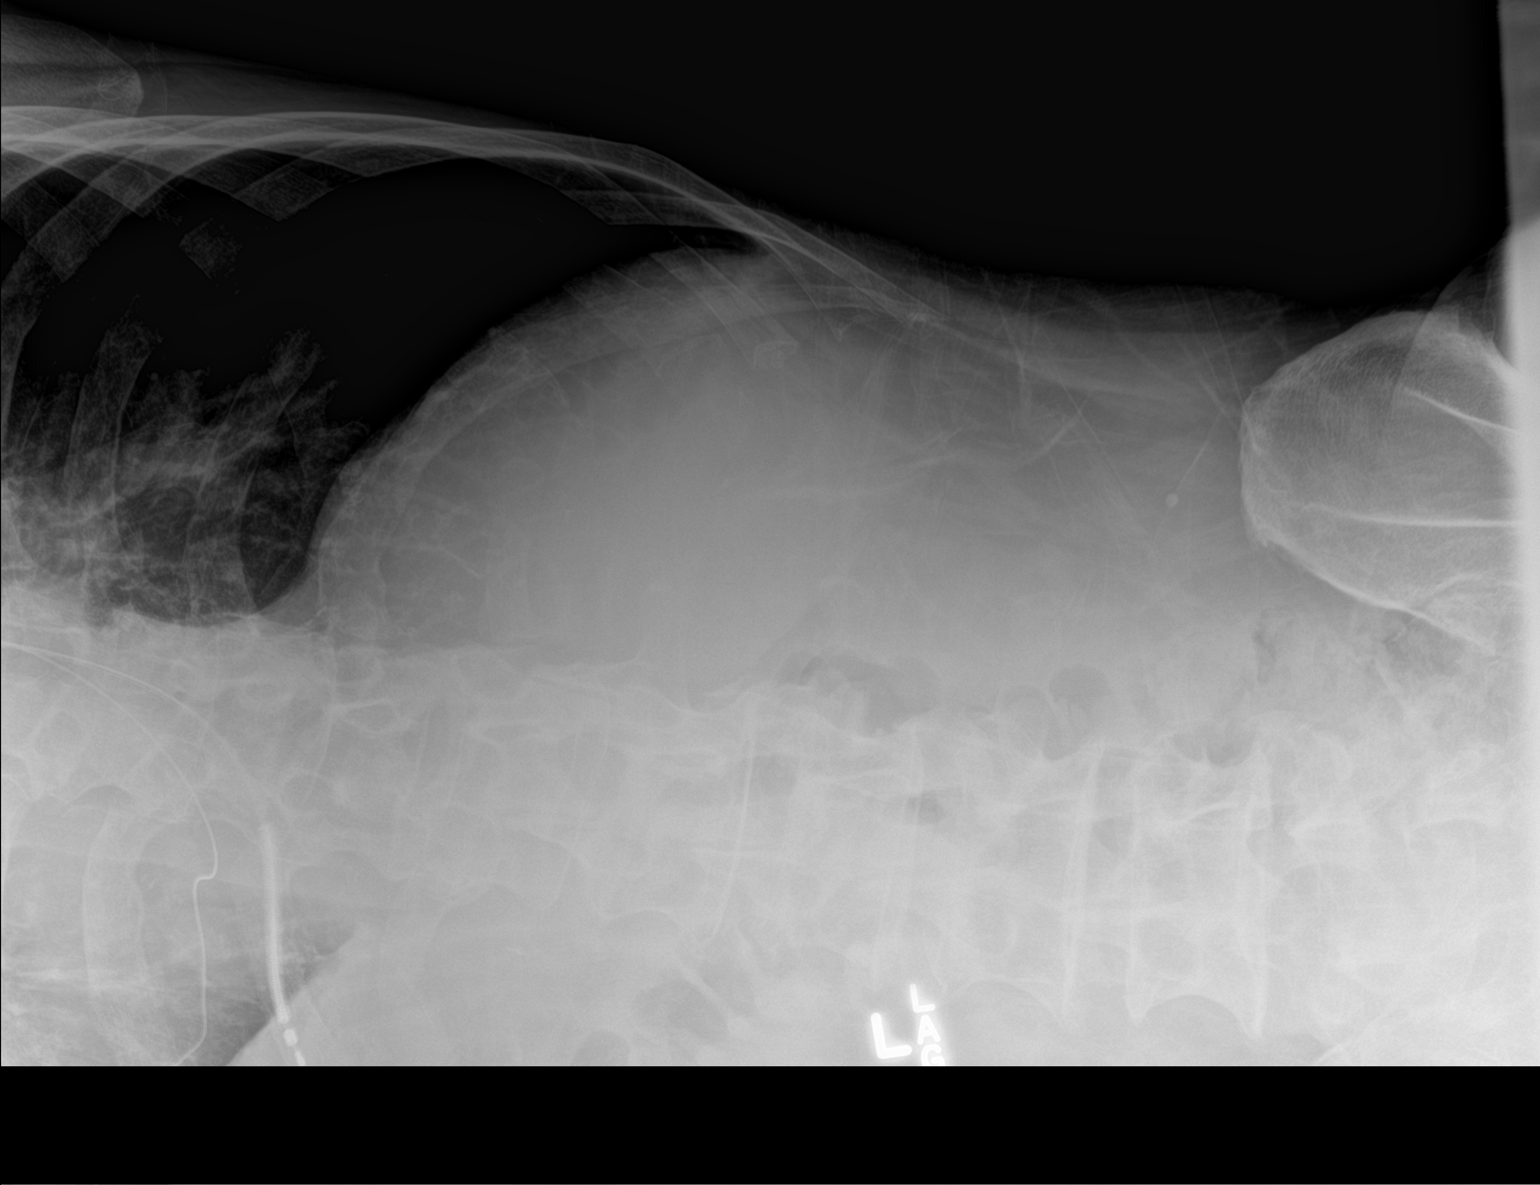

[3 of 3 positions shown; findings below may reference images not displayed]

FINDINGS: Single-view chest demonstrates left-sided pacing device. Mild
cardiomegaly. Trace left pleural effusion or thickening. No
consolidation. Aortic atherosclerosis.

Supine and decubitus views of the abdomen demonstrate no free air.
Decreased small bowel distension within the central abdomen with few
residual air filled distended central small bowel loops up to
cm. Increased distal bowel gas.
IMPRESSION: 1. No radiographic evidence for acute cardiopulmonary abnormality
2. Decreased amount of small bowel distension when compared to scout
image of CT.

## 2021-06-28 DIAGNOSIS — G8929 Other chronic pain: Secondary | ICD-10-CM | POA: Diagnosis not present

## 2021-06-28 DIAGNOSIS — M25511 Pain in right shoulder: Secondary | ICD-10-CM | POA: Diagnosis not present

## 2021-07-01 DIAGNOSIS — Z4502 Encounter for adjustment and management of automatic implantable cardiac defibrillator: Secondary | ICD-10-CM | POA: Diagnosis not present

## 2021-07-03 IMAGING — DX DG CHEST 1V PORT
1 series · 1 of 1 positions shown · non-contrast
Comparison: 02/18/2019

CLINICAL DATA: [id number]

EXAM:
PORTABLE CHEST 1 VIEW

[chest]
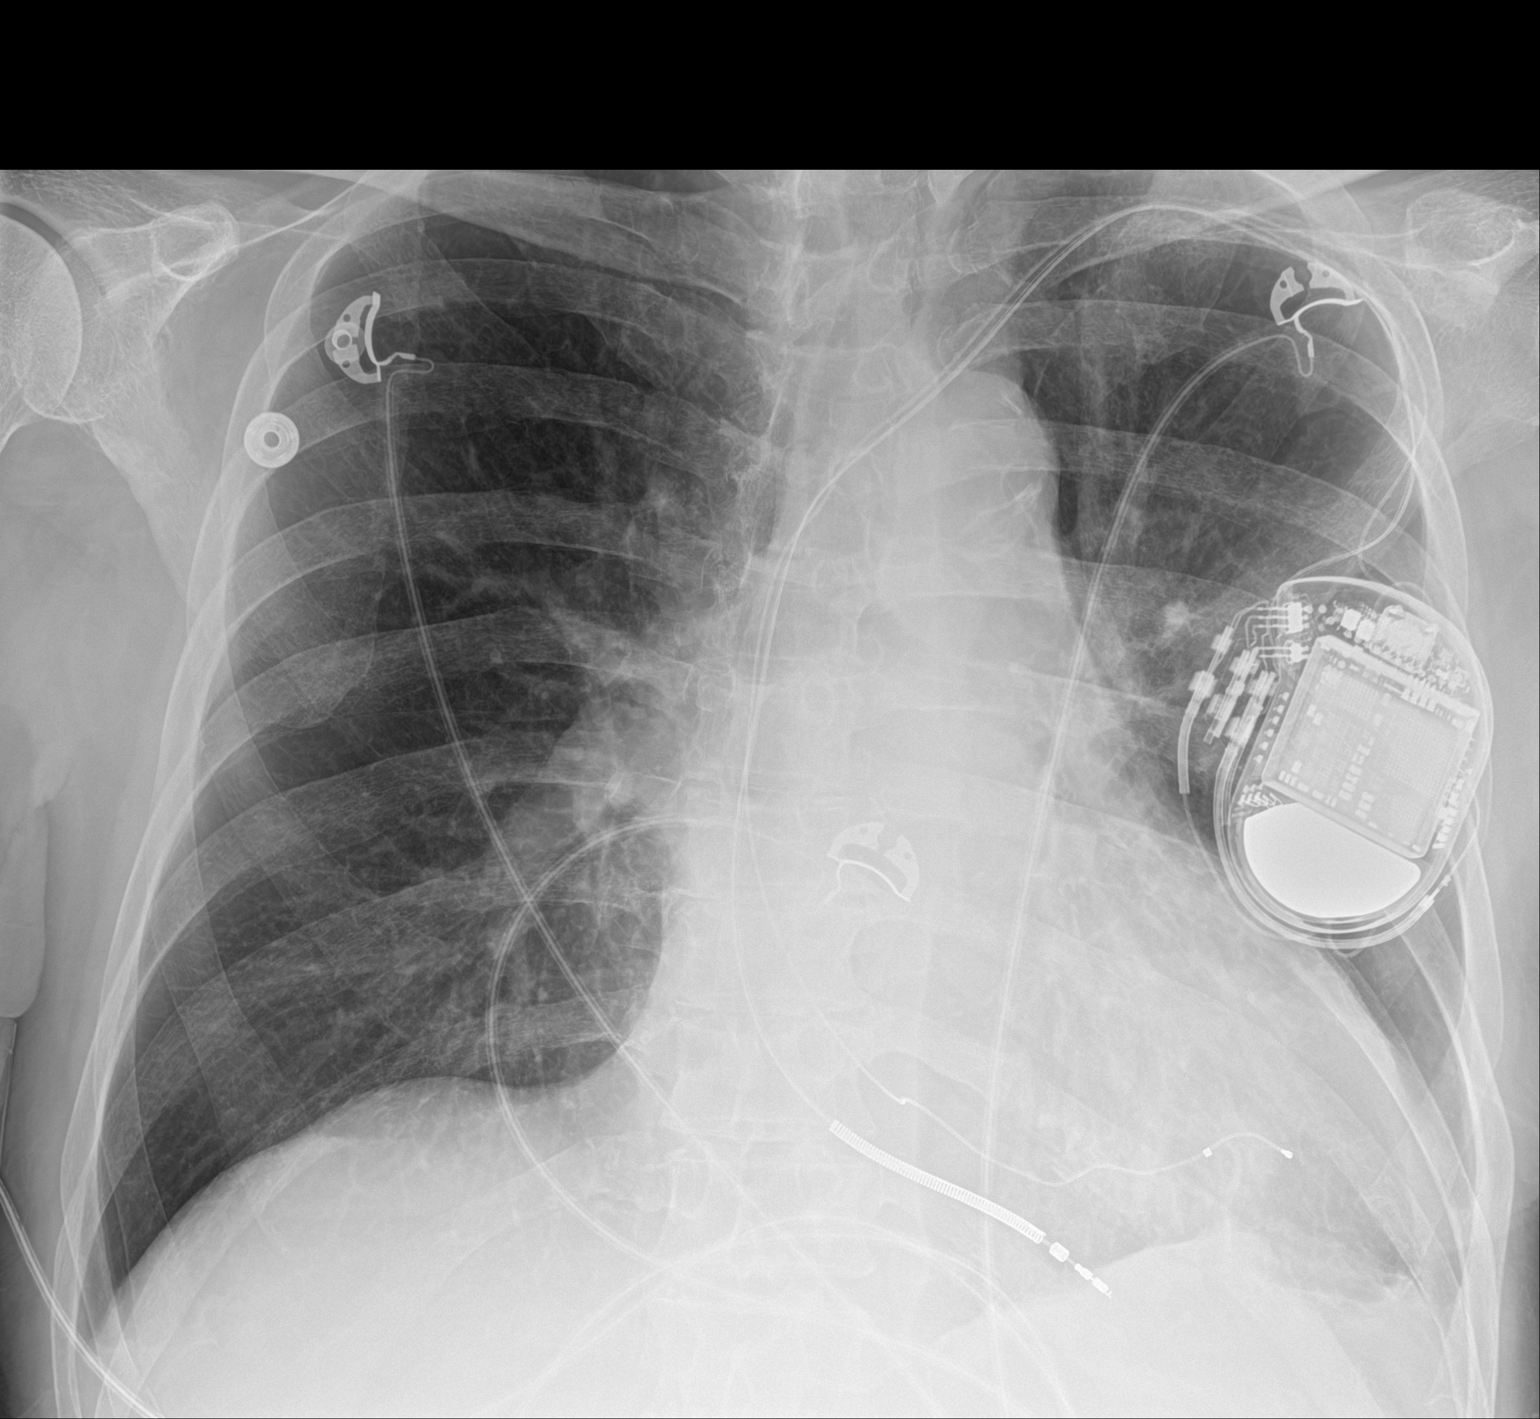

[1 of 1 positions shown; findings below may reference images not displayed]

FINDINGS: Cardiomegaly with left chest multi lead pacer defibrillator. Both
lungs are clear. The visualized skeletal structures are
unremarkable.
IMPRESSION: Cardiomegaly without acute abnormality of the lungs in AP portable
projection.

## 2021-07-14 DIAGNOSIS — R0602 Shortness of breath: Secondary | ICD-10-CM | POA: Diagnosis not present

## 2021-07-30 DEATH — deceased
